# Patient Record
Sex: Female | Born: 1955 | Race: Black or African American | Hispanic: No | Marital: Single | Smoking: Former smoker
Health system: Southern US, Community
[De-identification: ages and names within clinical notes are randomized; demographics above are authoritative.]

## PROBLEM LIST (undated history)

## (undated) DIAGNOSIS — I499 Cardiac arrhythmia, unspecified: Secondary | ICD-10-CM

## (undated) DIAGNOSIS — R000 Tachycardia, unspecified: Secondary | ICD-10-CM

## (undated) DIAGNOSIS — E119 Type 2 diabetes mellitus without complications: Secondary | ICD-10-CM

## (undated) DIAGNOSIS — G8929 Other chronic pain: Secondary | ICD-10-CM

## (undated) DIAGNOSIS — D649 Anemia, unspecified: Secondary | ICD-10-CM

## (undated) DIAGNOSIS — E785 Hyperlipidemia, unspecified: Secondary | ICD-10-CM

## (undated) DIAGNOSIS — M1990 Unspecified osteoarthritis, unspecified site: Secondary | ICD-10-CM

## (undated) DIAGNOSIS — Z9289 Personal history of other medical treatment: Secondary | ICD-10-CM

## (undated) DIAGNOSIS — M545 Low back pain, unspecified: Secondary | ICD-10-CM

## (undated) HISTORY — DX: Tachycardia, unspecified: R00.0

## (undated) HISTORY — PX: JOINT REPLACEMENT: SHX530

---

## 1978-01-21 HISTORY — PX: TUBAL LIGATION: SHX77

## 1988-09-21 HISTORY — PX: CARPAL TUNNEL RELEASE: SHX101

## 1997-08-24 ENCOUNTER — Ambulatory Visit (HOSPITAL_COMMUNITY): Admission: RE | Admit: 1997-08-24 | Discharge: 1997-08-24 | Payer: Self-pay | Admitting: *Deleted

## 1998-01-21 HISTORY — PX: HERNIA REPAIR: SHX51

## 1998-03-28 ENCOUNTER — Other Ambulatory Visit: Admission: RE | Admit: 1998-03-28 | Discharge: 1998-03-28 | Payer: Self-pay | Admitting: *Deleted

## 1998-04-28 ENCOUNTER — Ambulatory Visit (HOSPITAL_COMMUNITY): Admission: RE | Admit: 1998-04-28 | Discharge: 1998-04-28 | Payer: Self-pay | Admitting: *Deleted

## 1998-06-07 ENCOUNTER — Encounter: Payer: Self-pay | Admitting: Gastroenterology

## 1998-06-07 ENCOUNTER — Ambulatory Visit (HOSPITAL_COMMUNITY): Admission: RE | Admit: 1998-06-07 | Discharge: 1998-06-07 | Payer: Self-pay | Admitting: Gastroenterology

## 1999-05-23 ENCOUNTER — Other Ambulatory Visit: Admission: RE | Admit: 1999-05-23 | Discharge: 1999-05-23 | Payer: Self-pay | Admitting: *Deleted

## 1999-05-31 ENCOUNTER — Ambulatory Visit (HOSPITAL_COMMUNITY): Admission: RE | Admit: 1999-05-31 | Discharge: 1999-05-31 | Payer: Self-pay | Admitting: *Deleted

## 2000-08-05 ENCOUNTER — Other Ambulatory Visit: Admission: RE | Admit: 2000-08-05 | Discharge: 2000-08-05 | Payer: Self-pay | Admitting: Internal Medicine

## 2000-08-22 ENCOUNTER — Ambulatory Visit (HOSPITAL_COMMUNITY): Admission: RE | Admit: 2000-08-22 | Discharge: 2000-08-22 | Payer: Self-pay | Admitting: Internal Medicine

## 2000-08-22 ENCOUNTER — Encounter: Payer: Self-pay | Admitting: Internal Medicine

## 2001-08-31 ENCOUNTER — Other Ambulatory Visit: Admission: RE | Admit: 2001-08-31 | Discharge: 2001-08-31 | Payer: Self-pay | Admitting: Internal Medicine

## 2001-09-08 ENCOUNTER — Ambulatory Visit (HOSPITAL_COMMUNITY): Admission: RE | Admit: 2001-09-08 | Discharge: 2001-09-08 | Payer: Self-pay | Admitting: Internal Medicine

## 2001-09-08 ENCOUNTER — Encounter: Payer: Self-pay | Admitting: Internal Medicine

## 2002-01-21 DIAGNOSIS — Z9289 Personal history of other medical treatment: Secondary | ICD-10-CM

## 2002-01-21 HISTORY — DX: Personal history of other medical treatment: Z92.89

## 2002-09-07 ENCOUNTER — Ambulatory Visit (HOSPITAL_COMMUNITY): Admission: RE | Admit: 2002-09-07 | Discharge: 2002-09-07 | Payer: Self-pay | Admitting: Internal Medicine

## 2002-09-07 ENCOUNTER — Encounter: Payer: Self-pay | Admitting: Internal Medicine

## 2002-10-22 HISTORY — PX: TOTAL KNEE ARTHROPLASTY: SHX125

## 2002-11-10 ENCOUNTER — Encounter: Payer: Self-pay | Admitting: Orthopedic Surgery

## 2002-11-16 ENCOUNTER — Inpatient Hospital Stay (HOSPITAL_COMMUNITY): Admission: RE | Admit: 2002-11-16 | Discharge: 2002-11-24 | Payer: Self-pay | Admitting: Orthopedic Surgery

## 2002-11-25 ENCOUNTER — Encounter: Admission: RE | Admit: 2002-11-25 | Discharge: 2002-11-25 | Payer: Self-pay | Admitting: Family Medicine

## 2003-01-05 ENCOUNTER — Encounter (HOSPITAL_BASED_OUTPATIENT_CLINIC_OR_DEPARTMENT_OTHER): Admission: RE | Admit: 2003-01-05 | Discharge: 2003-04-05 | Payer: Self-pay | Admitting: Internal Medicine

## 2003-03-10 ENCOUNTER — Emergency Department (HOSPITAL_COMMUNITY): Admission: EM | Admit: 2003-03-10 | Discharge: 2003-03-10 | Payer: Self-pay | Admitting: Family Medicine

## 2003-09-05 ENCOUNTER — Other Ambulatory Visit: Admission: RE | Admit: 2003-09-05 | Discharge: 2003-09-05 | Payer: Self-pay | Admitting: Internal Medicine

## 2003-09-08 ENCOUNTER — Ambulatory Visit (HOSPITAL_COMMUNITY): Admission: RE | Admit: 2003-09-08 | Discharge: 2003-09-08 | Payer: Self-pay | Admitting: Internal Medicine

## 2004-01-18 ENCOUNTER — Encounter: Admission: RE | Admit: 2004-01-18 | Discharge: 2004-04-17 | Payer: Self-pay | Admitting: Internal Medicine

## 2004-06-07 ENCOUNTER — Encounter: Admission: RE | Admit: 2004-06-07 | Discharge: 2004-09-05 | Payer: Self-pay | Admitting: Internal Medicine

## 2004-09-13 ENCOUNTER — Encounter: Admission: RE | Admit: 2004-09-13 | Discharge: 2004-12-12 | Payer: Self-pay | Admitting: Internal Medicine

## 2004-09-18 ENCOUNTER — Ambulatory Visit (HOSPITAL_COMMUNITY): Admission: RE | Admit: 2004-09-18 | Discharge: 2004-09-18 | Payer: Self-pay | Admitting: Internal Medicine

## 2004-10-10 ENCOUNTER — Other Ambulatory Visit: Admission: RE | Admit: 2004-10-10 | Discharge: 2004-10-10 | Payer: Self-pay | Admitting: *Deleted

## 2004-11-23 ENCOUNTER — Ambulatory Visit: Payer: Self-pay | Admitting: Cardiology

## 2004-12-05 ENCOUNTER — Ambulatory Visit: Payer: Self-pay

## 2005-10-14 ENCOUNTER — Other Ambulatory Visit: Admission: RE | Admit: 2005-10-14 | Discharge: 2005-10-14 | Payer: Self-pay | Admitting: *Deleted

## 2005-10-14 ENCOUNTER — Ambulatory Visit (HOSPITAL_COMMUNITY): Admission: RE | Admit: 2005-10-14 | Discharge: 2005-10-14 | Payer: Self-pay | Admitting: Family Medicine

## 2005-10-14 IMAGING — MG MM DIGITAL SCREENING BILAT
4 series · 4 of 4 positions shown · non-contrast
Comparison: none

DG SCREEN MAMMOGRAM BILATERAL
Bilateral CC and MLO view(s) were taken.
Prior study comparison: [DATE], bilateral screening mammogram.

DIGITAL SCREENING MAMMOGRAM WITH CAD:
The breast tissue is heterogeneously dense.  There is no dominant mass, architectural distortion or
calcification to suggest malignancy.

[R CC]
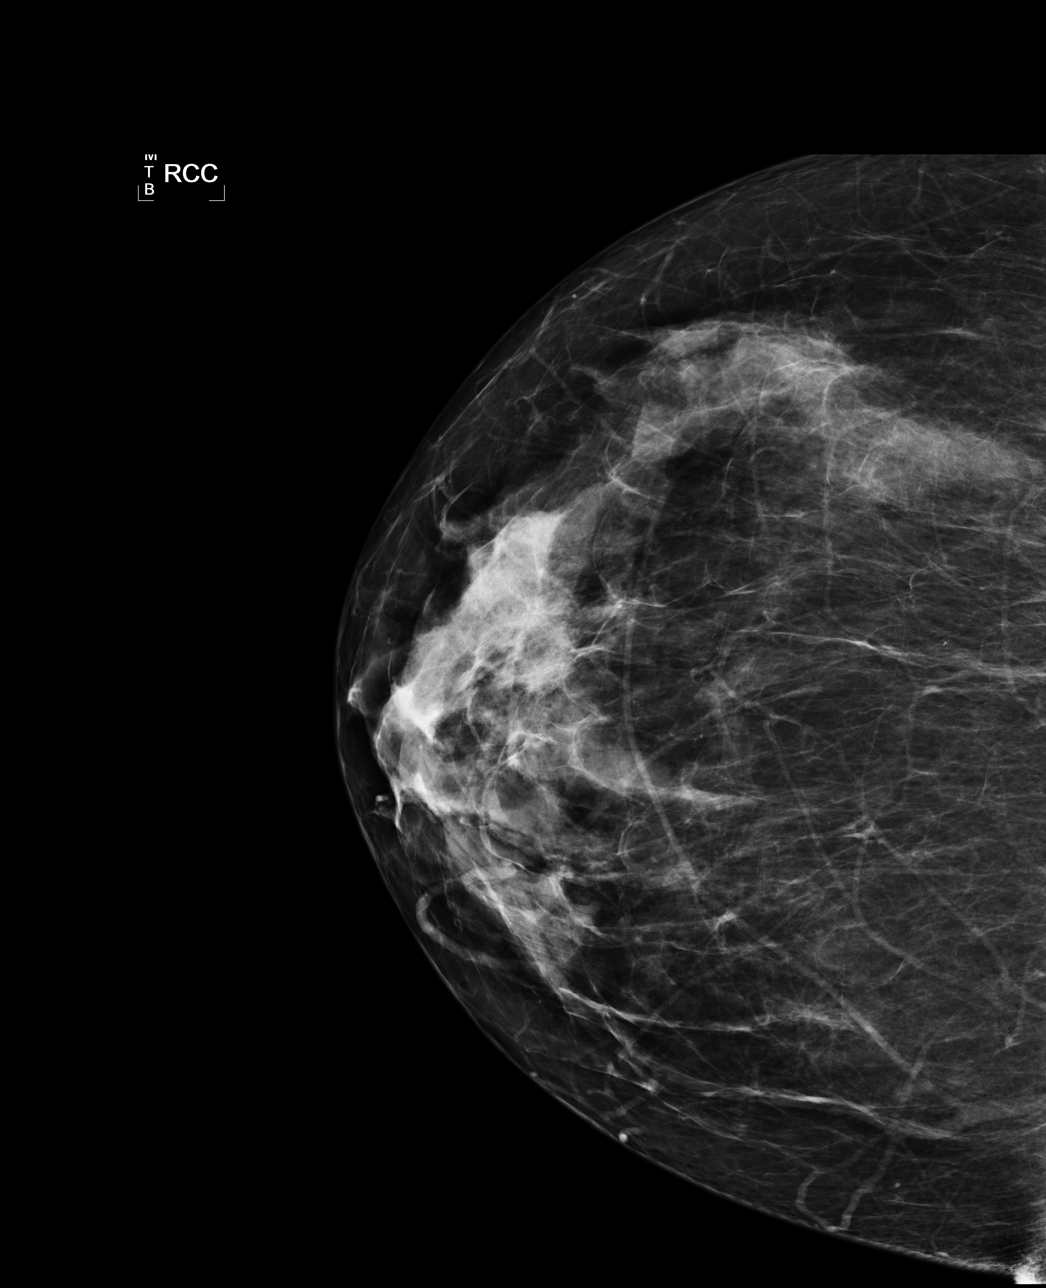

[R MLO]
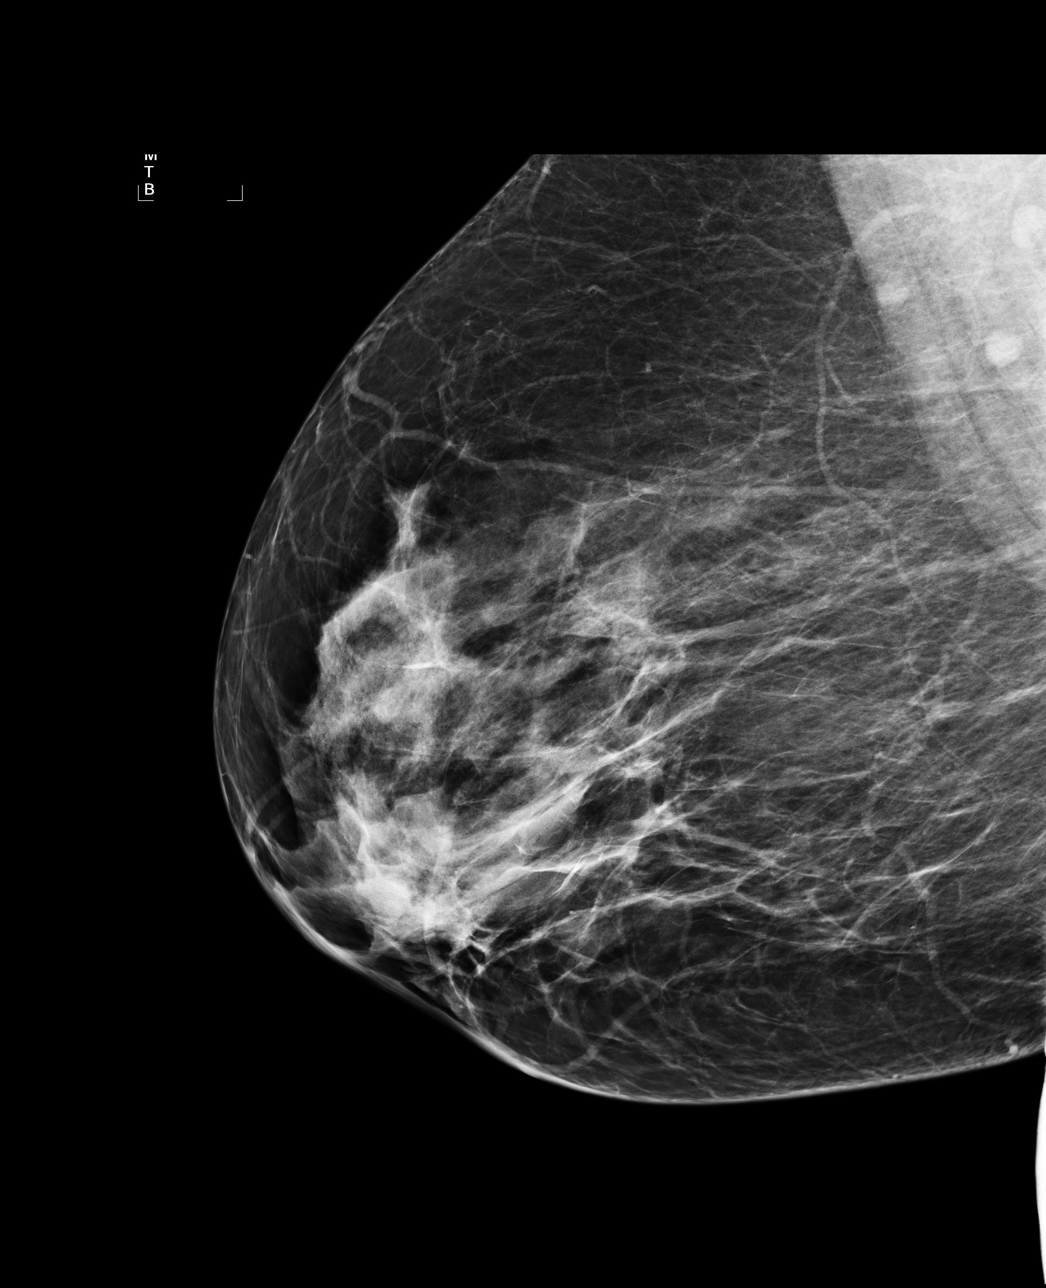

[L CC]
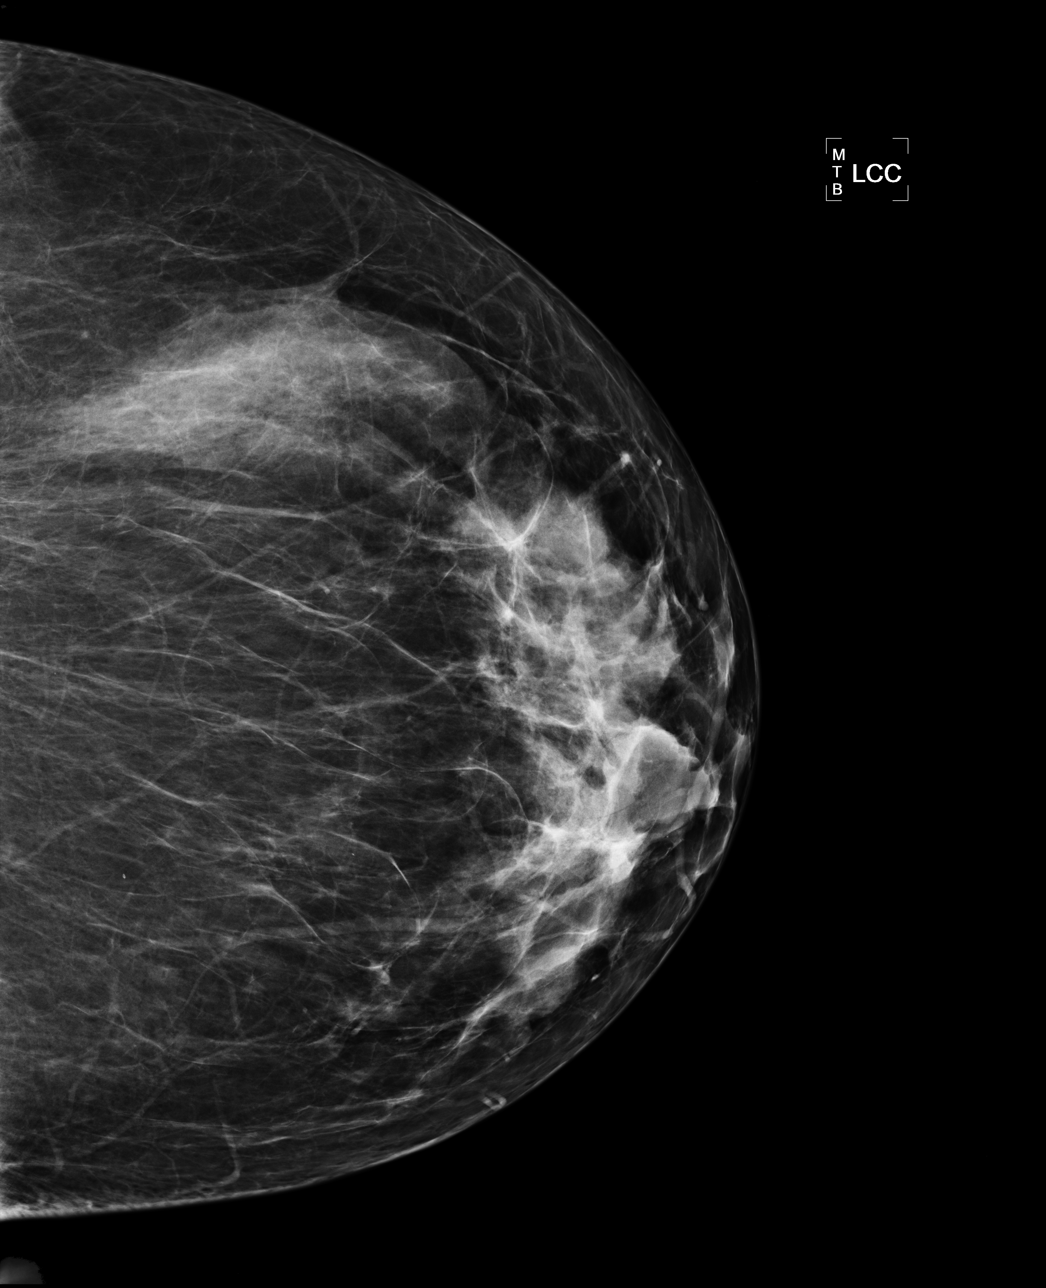

[L MLO]
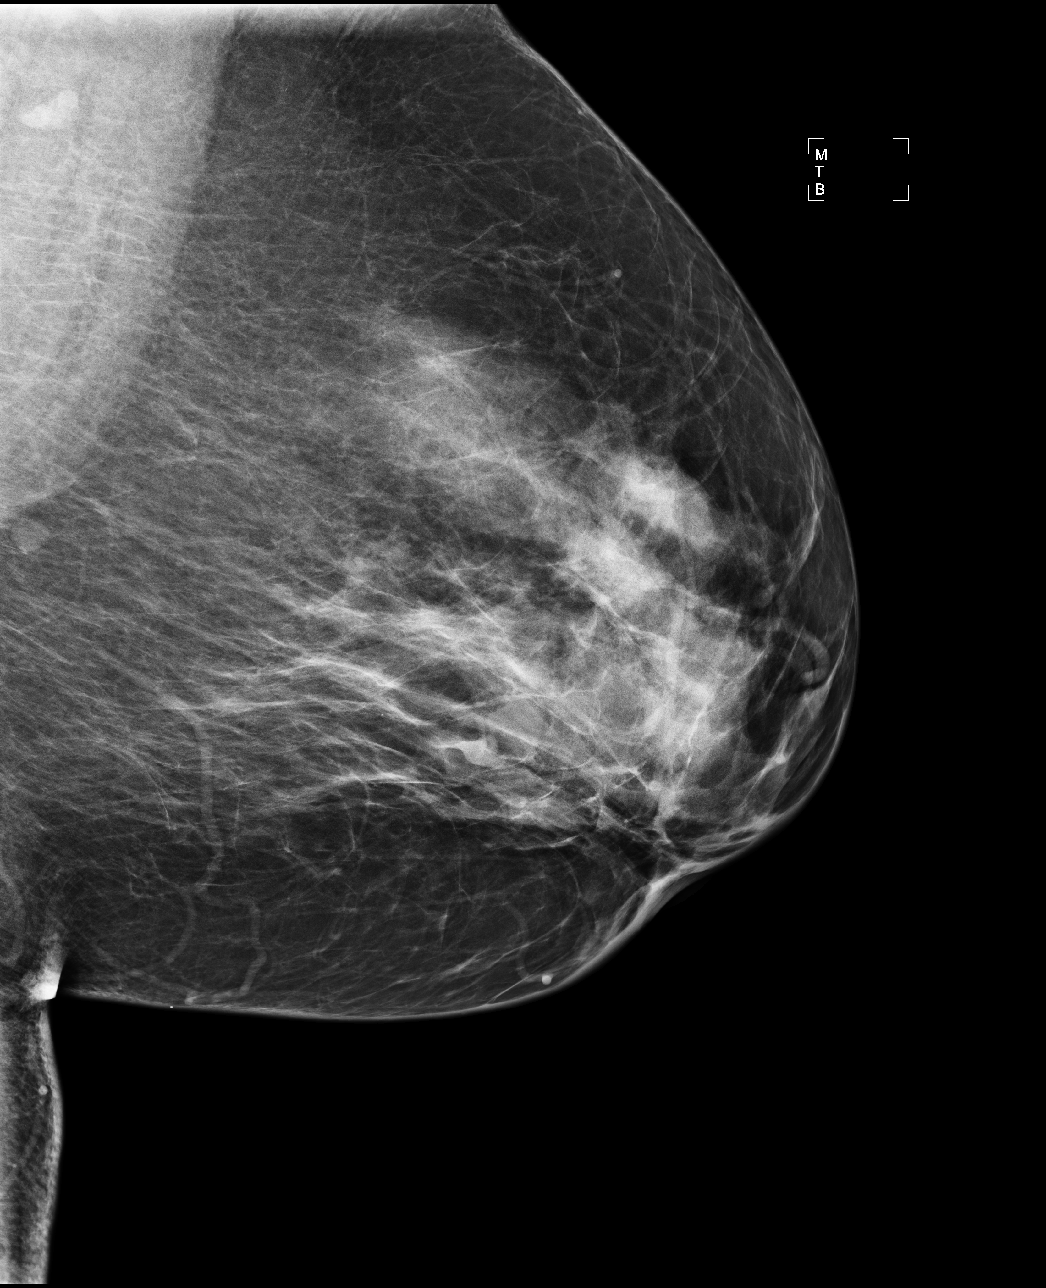

[4 of 4 positions shown; findings below may reference images not displayed]

IMPRESSION: No mammographic evidence of malignancy.  Suggest yearly screening mammography.

ASSESSMENT: Negative - BI-RADS 1

Screening mammogram in 1 year.
ANALYZED BY COMPUTER AIDED DETECTION. , THIS PROCEDURE WAS A DIGITAL MAMMOGRAM.

## 2005-12-20 ENCOUNTER — Encounter: Admission: RE | Admit: 2005-12-20 | Discharge: 2005-12-20 | Payer: Self-pay | Admitting: Family Medicine

## 2005-12-20 IMAGING — US US EXTREM LOW VENOUS*L*
1 series · 14 of 24 positions shown · non-contrast
Comparison: none

CLINICAL DATA: Left calf pain and swelling.   Rule out DVT.
LEFT LOWER EXTREMITY VENOUS DOPPLER ULTRASOUND:
TECHNIQUE: Gray scale sonography with compression, as well as color and duplex Doppler ultrasound, were performed to evaluate the deep venous system from the level of the common femoral vein through the popliteal and proximal calf veins.

[Series 1: unknown · 14 of 33 slices shown]
[im 1/33]
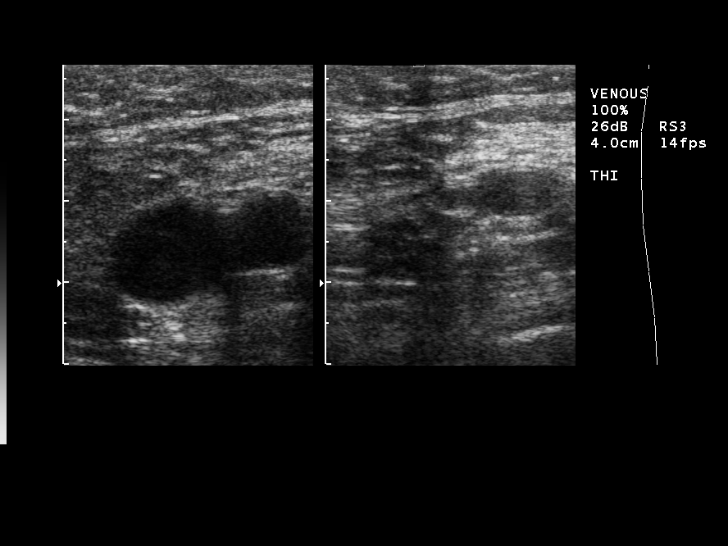
[im 3/33]
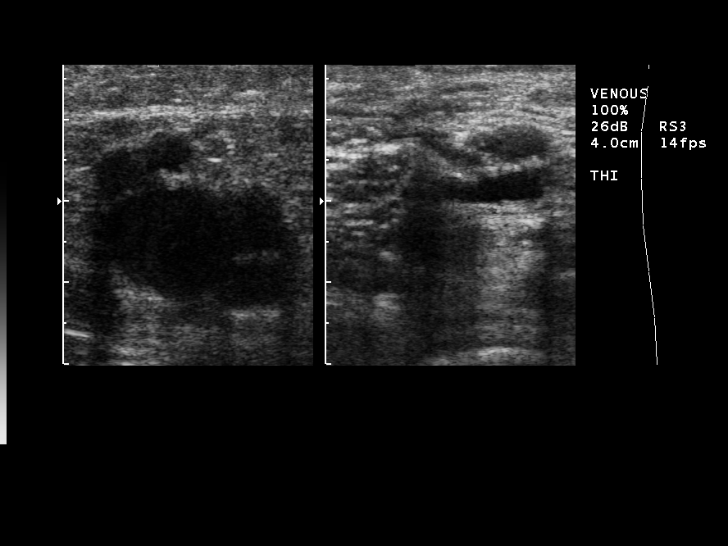
[im 6/33]
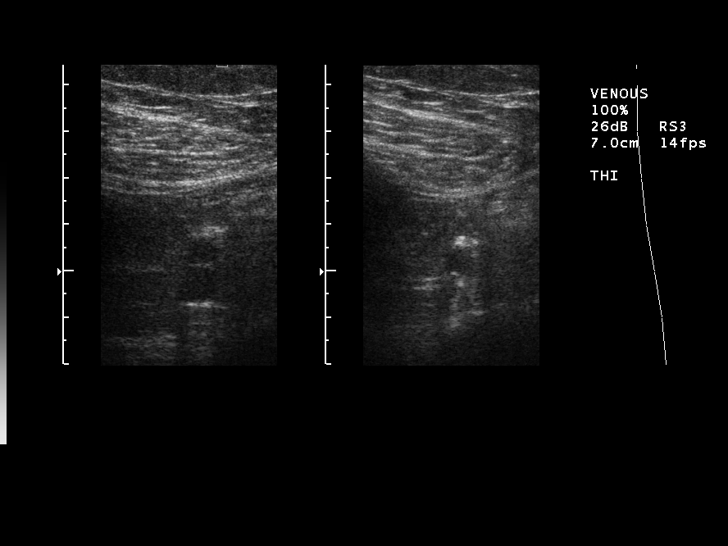
[im 9/33]
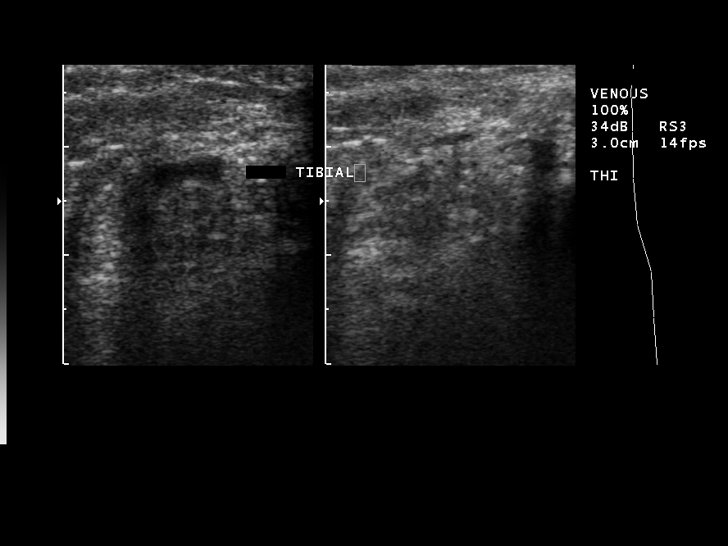
[im 10/33]
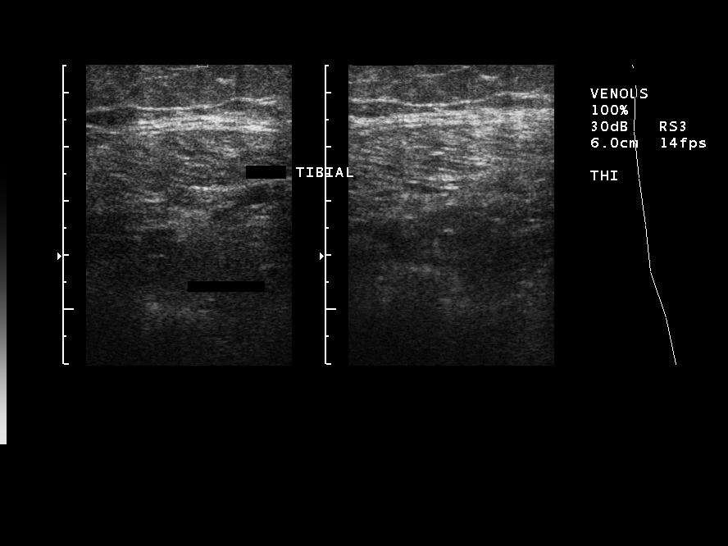
[im 13/33]
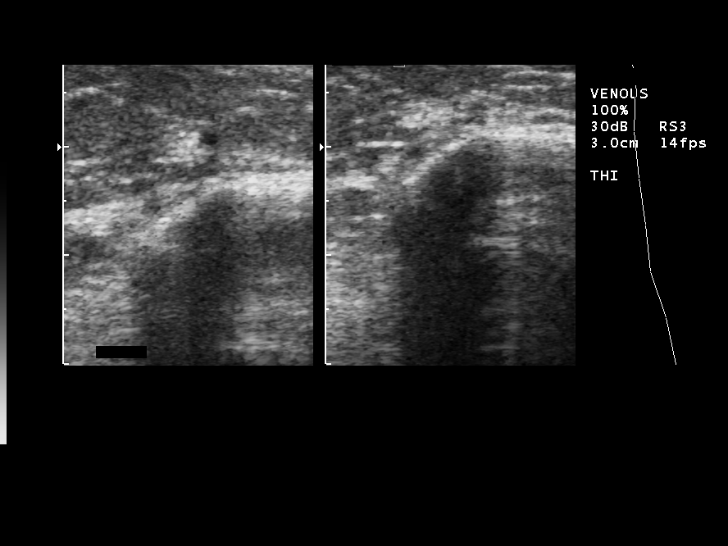
[im 16/33]
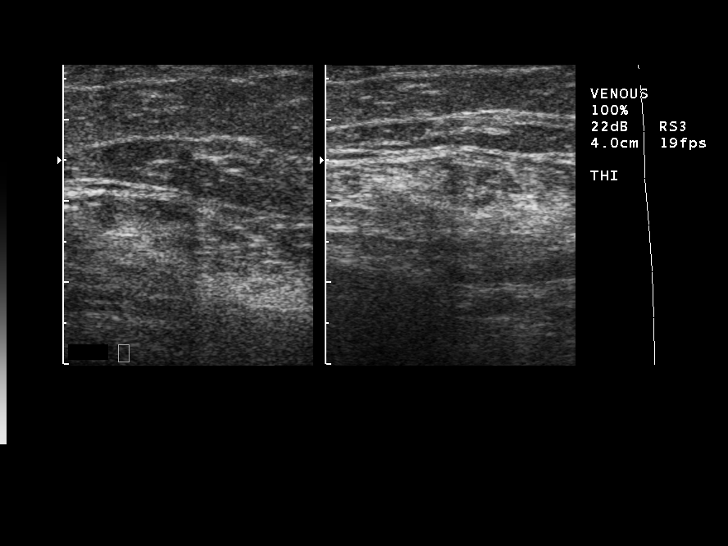
[im 17/33]
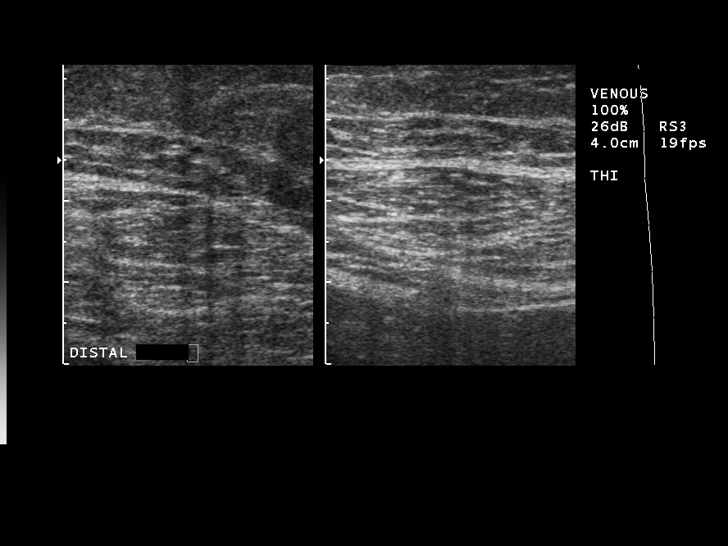
[im 20/33]
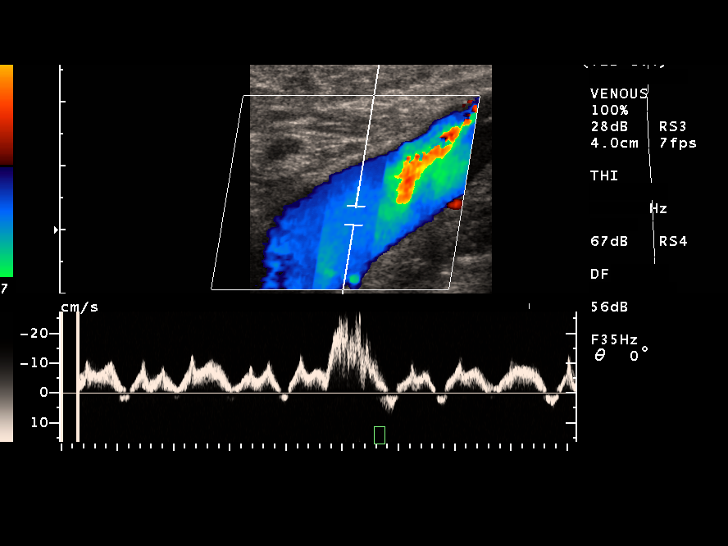
[im 23/33]
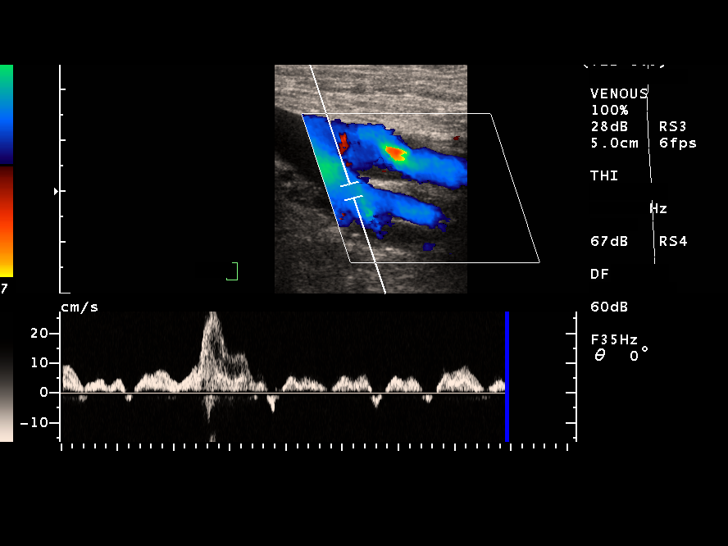
[im 26/33]
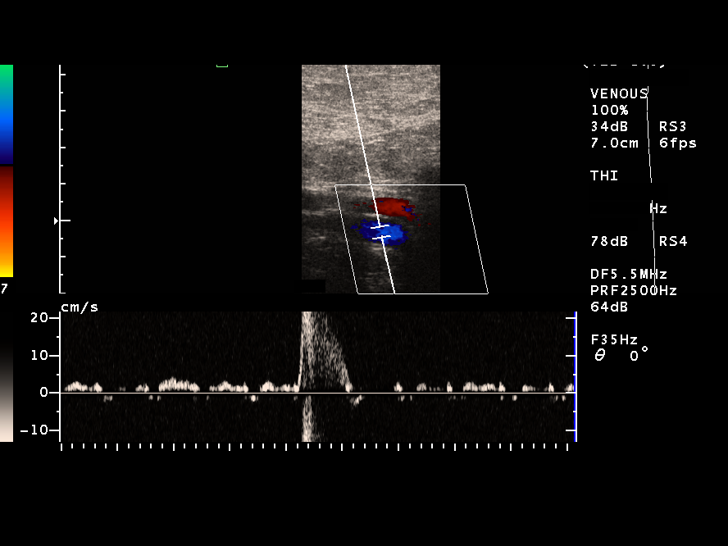
[im 27/33]
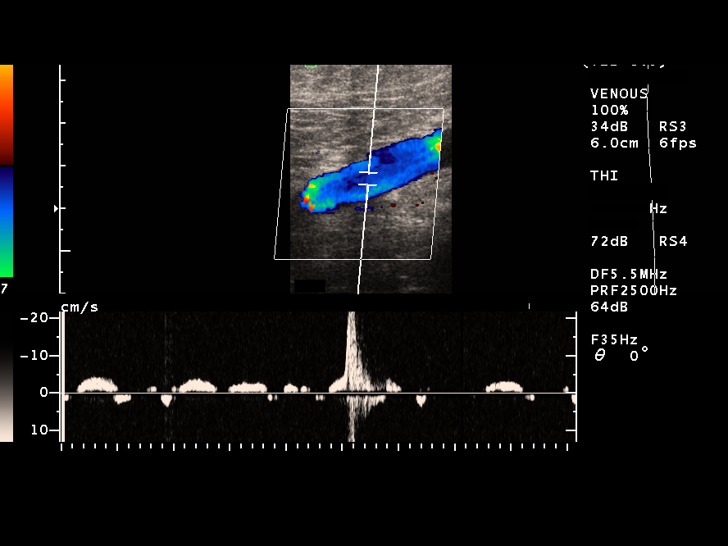
[im 30/33]
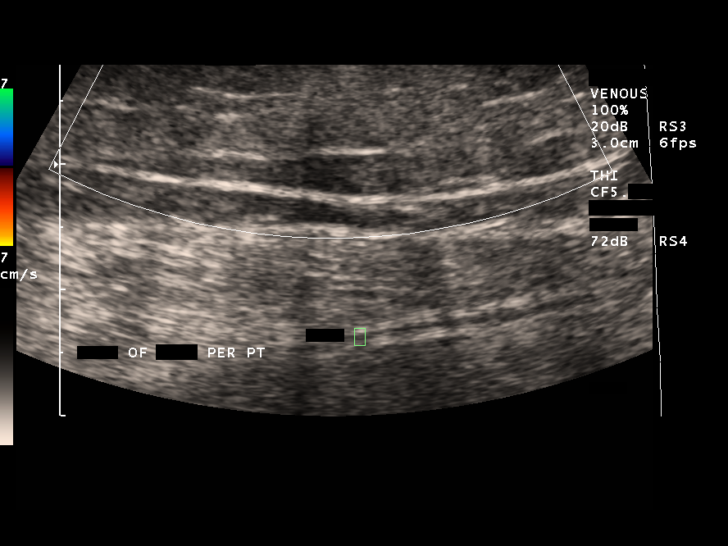
[im 33/33]
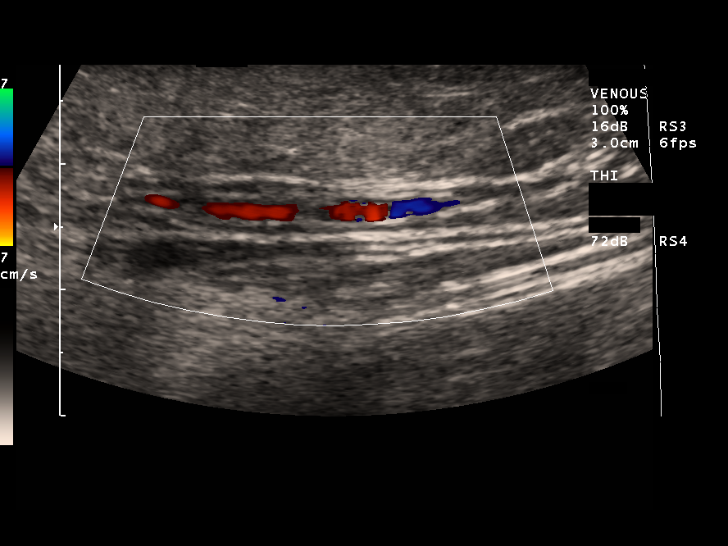

[14 of 24 positions shown; findings below may reference images not displayed]

FINDINGS: Normal compressibility and Doppler flow in the deep veins of the left leg.  No evidence of DVT.
IMPRESSION: 1.  Negative for DVT.
2.  No fluid collection or mass is seen in the area identified by the patient in the posterior calf in the area of swelling.

## 2006-07-15 ENCOUNTER — Ambulatory Visit: Payer: Self-pay | Admitting: Oncology

## 2006-09-03 ENCOUNTER — Ambulatory Visit: Payer: Self-pay | Admitting: Oncology

## 2006-09-03 LAB — URINALYSIS, MICROSCOPIC - CHCC
Bilirubin (Urine): NEGATIVE
Blood: NEGATIVE
Leukocyte Esterase: NEGATIVE
Nitrite: NEGATIVE
Protein: NEGATIVE mg/dL
RBC count: NEGATIVE (ref 0–2)
Specific Gravity, Urine: 1.03 (ref 1.003–1.035)

## 2006-09-03 LAB — CHCC SMEAR

## 2006-09-03 LAB — CBC & DIFF AND RETIC
Basophils Absolute: 0 10*3/uL (ref 0.0–0.1)
EOS%: 1 % (ref 0.0–7.0)
LYMPH%: 42.2 % (ref 14.0–48.0)
MCH: 26.3 pg (ref 26.0–34.0)
MCHC: 32.8 g/dL (ref 32.0–36.0)
MONO%: 6.4 % (ref 0.0–13.0)
NEUT%: 49.9 % (ref 39.6–76.8)
RBC: 3.56 10*6/uL — ABNORMAL LOW (ref 3.70–5.32)
RDW: 16.6 % — ABNORMAL HIGH (ref 11.3–14.5)
lymph#: 2.6 10*3/uL (ref 0.9–3.3)

## 2006-09-03 LAB — ERYTHROCYTE SEDIMENTATION RATE: Sed Rate: 45 mm/hr — ABNORMAL HIGH (ref 0–30)

## 2006-09-03 LAB — MORPHOLOGY

## 2006-09-05 LAB — IMMUNOFIXATION ELECTROPHORESIS
IgA: 186 mg/dL (ref 68–378)
IgG (Immunoglobin G), Serum: 1370 mg/dL (ref 694–1618)
IgM, Serum: 53 mg/dL — ABNORMAL LOW (ref 60–263)

## 2006-09-05 LAB — ANA: Anti Nuclear Antibody(ANA): NEGATIVE

## 2006-09-25 ENCOUNTER — Ambulatory Visit (HOSPITAL_COMMUNITY): Admission: RE | Admit: 2006-09-25 | Discharge: 2006-09-25 | Payer: Self-pay | Admitting: Oncology

## 2006-09-25 ENCOUNTER — Encounter: Payer: Self-pay | Admitting: Oncology

## 2006-09-25 ENCOUNTER — Ambulatory Visit: Payer: Self-pay | Admitting: Oncology

## 2006-10-08 ENCOUNTER — Encounter: Admission: RE | Admit: 2006-10-08 | Discharge: 2006-10-08 | Payer: Self-pay | Admitting: Family Medicine

## 2006-10-08 IMAGING — CT CT PELVIS W/ CM
2 of 5 series · 17 of 46 positions shown, 19 images · IV contrast (READICAT/WATER & [ID] OMNI 300)
Comparison: None.

CLINICAL DATA: Periumbilical pain and possible mass.
 ABDOMEN CT WITH CONTRAST:
TECHNIQUE: Multidetector CT imaging of the abdomen was performed following the standard protocol during bolus administration of intravenous contrast.
 Contrast:  125 cc of Omnipaque 300.
TECHNIQUE: Multidetector CT imaging of the pelvis was performed following the standard protocol during bolus administration of intravenous contrast.

[Series 3: routine abdomen · axial · 0.82mm/px · z∈[-345,+45]mm · 14 of 88 slices shown, 16 images]
[im 5/88  soft-tissue]
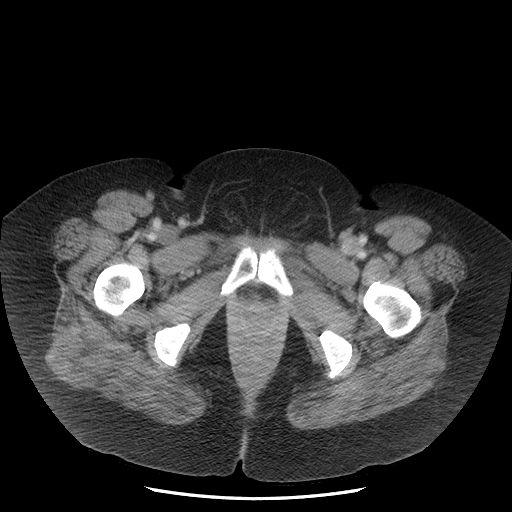
[im 5/88  bone]
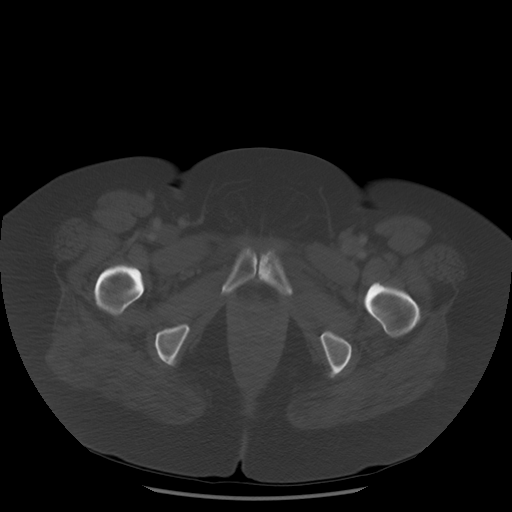
[im 10/88  soft-tissue]
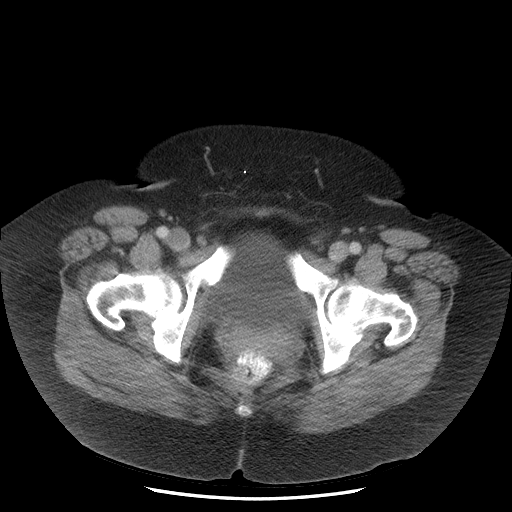
[im 20/88  soft-tissue]
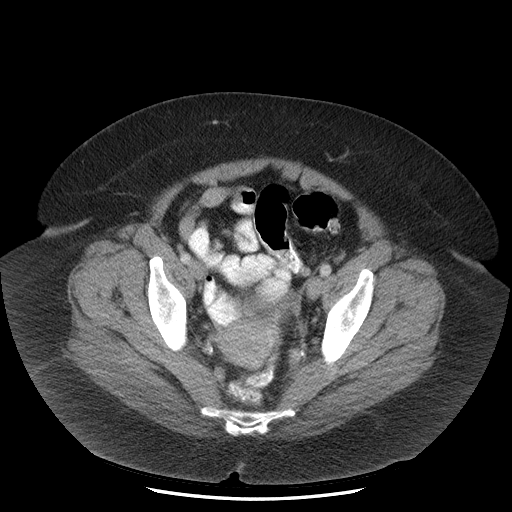
[im 25/88  soft-tissue]
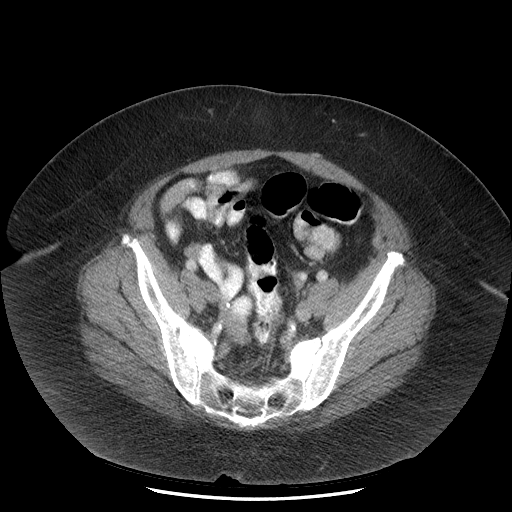
[im 30/88  soft-tissue]
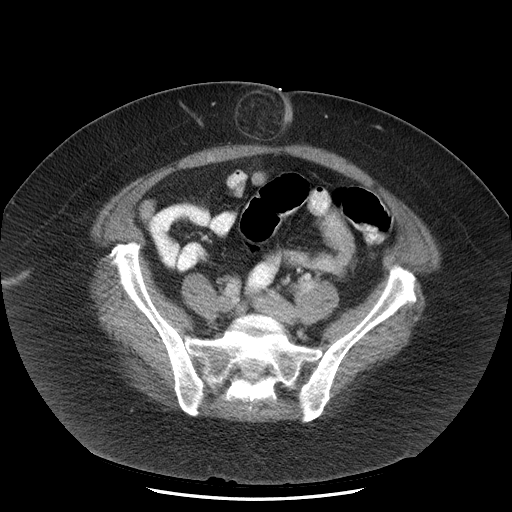
[im 34/88  soft-tissue]
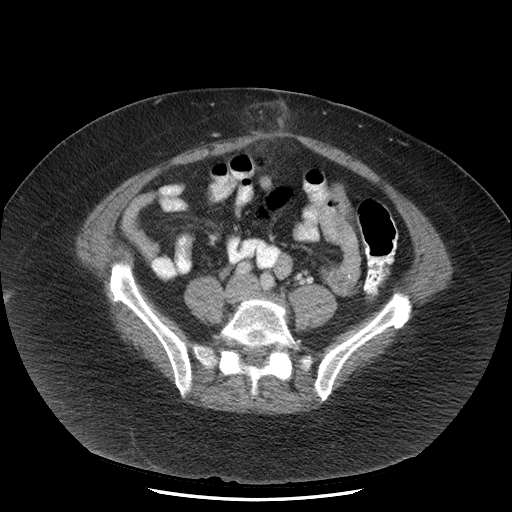
[im 39/88  soft-tissue]
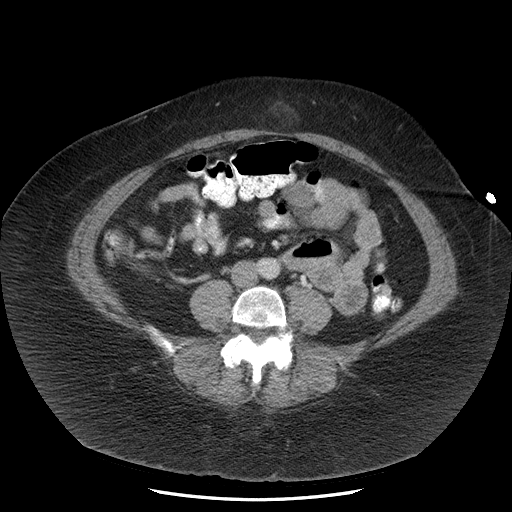
[im 49/88  soft-tissue]
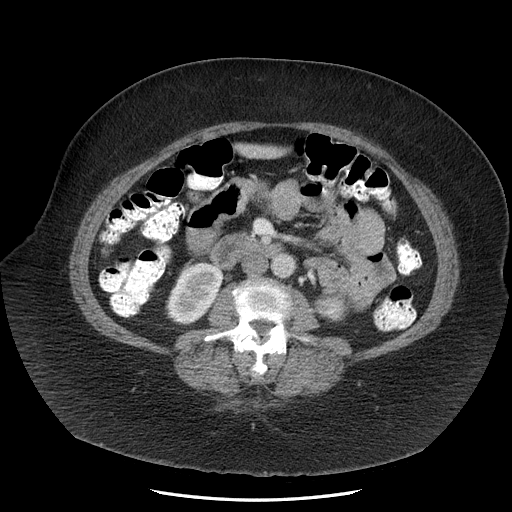
[im 54/88  soft-tissue]
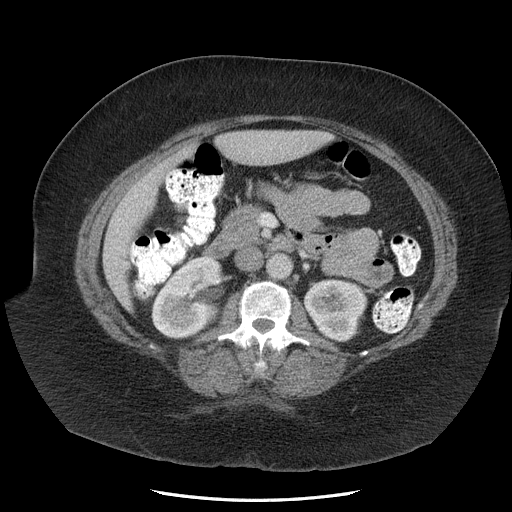
[im 54/88  bone]
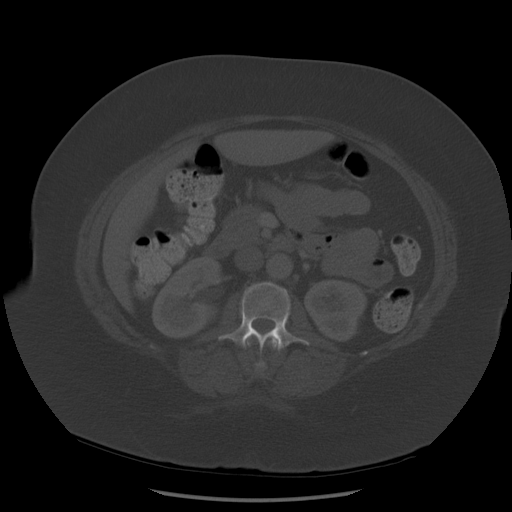
[im 59/88  soft-tissue]
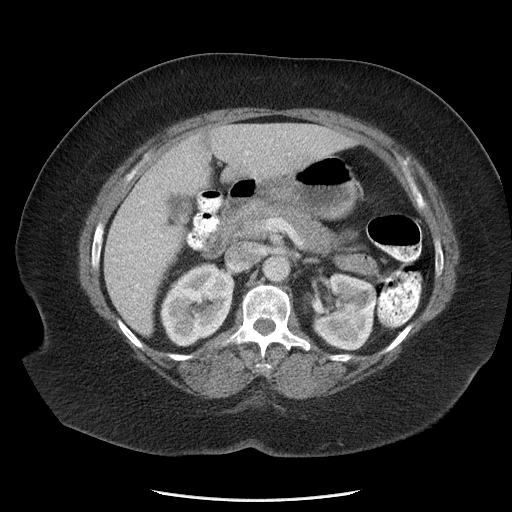
[im 63/88  soft-tissue]
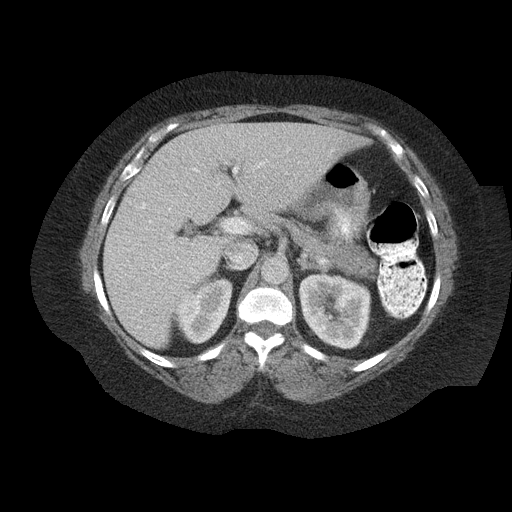
[im 68/88  soft-tissue]
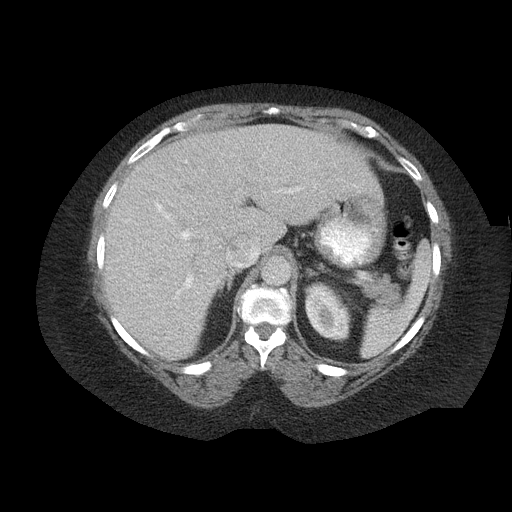
[im 78/88  soft-tissue]
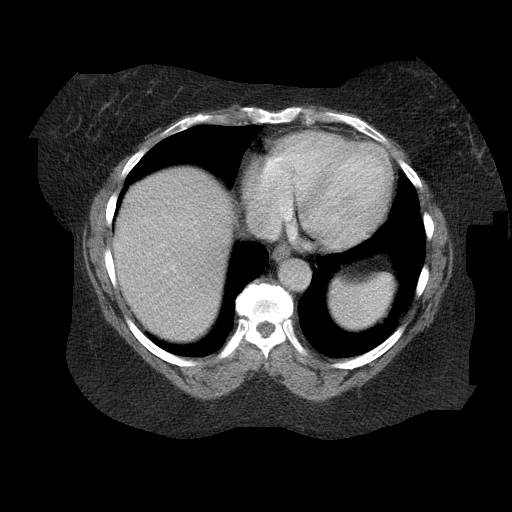
[im 83/88  soft-tissue]
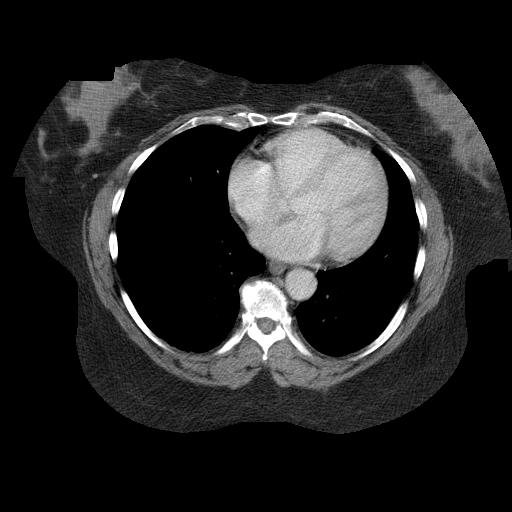

[Series 602: sagittal body · sagittal · 0.86mm/px · 3 of 169 slices shown]
[im 57/169  soft-tissue]
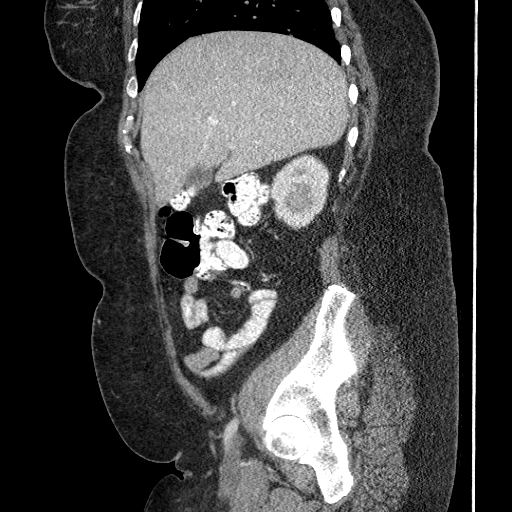
[im 75/169  soft-tissue]
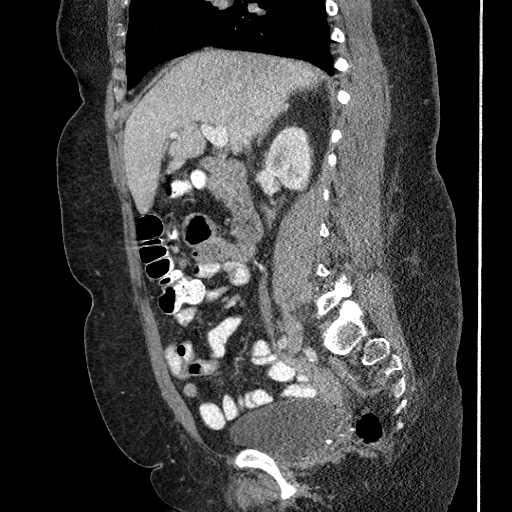
[im 94/169  soft-tissue]
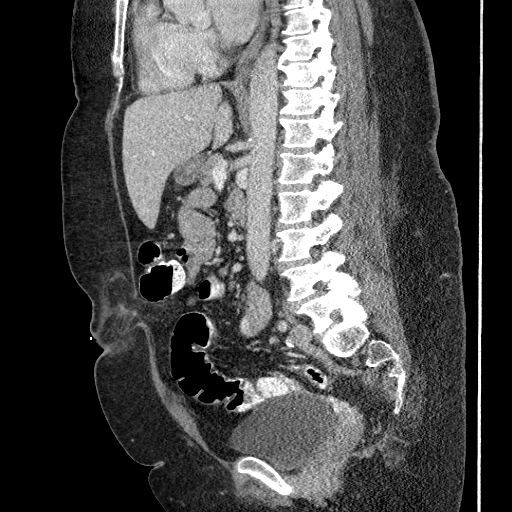

[17 of 46 positions shown; findings below may reference images not displayed]

FINDINGS: The lung bases are clear other than vague nodular opacity in the anterior right middle lobe on image #1 which may represent scarring.  The liver enhances with no focal abnormality and no ductal dilatation is seen.  No calcified gallstones are noted with the gallbladder somewhat retracted.  The pancreas is normal in size an the pancreatic duct is not dilated. The adrenal glands and spleen appear normal.  The kidneys enhance and, on delayed images, the pelvocaliceal systems appear normal.  The abdominal aorta is normal in caliber.
IMPRESSION: Negative CT of the abdomen.
 PELVIS CT WITH CONTRAST:
FINDINGS: A small periumbilical hernia is noted slightly superior and to the right of the umbilicus containing only fat.  Uterus is normal in size.  The urinary bladder is unremarkable.  No pelvic mass, fluid, or adenopathy is seen.
IMPRESSION: Small right periumbilical hernia containing only fat.  No other abnormality.

## 2006-10-24 ENCOUNTER — Ambulatory Visit (HOSPITAL_COMMUNITY): Admission: RE | Admit: 2006-10-24 | Discharge: 2006-10-24 | Payer: Self-pay | Admitting: Family Medicine

## 2006-10-24 IMAGING — MG MM DIGITAL SCREENING BILAT
1 series · 1 of 1 positions shown · non-contrast
Comparison: Prior studies.

DG SCREEN MAMMOGRAM BILATERAL
Bilateral CC and MLO view(s) were taken.
Prior study comparison: [DATE], bilateral screening mammogram.

DIGITAL SCREENING MAMMOGRAM WITH CAD:

[L CC]
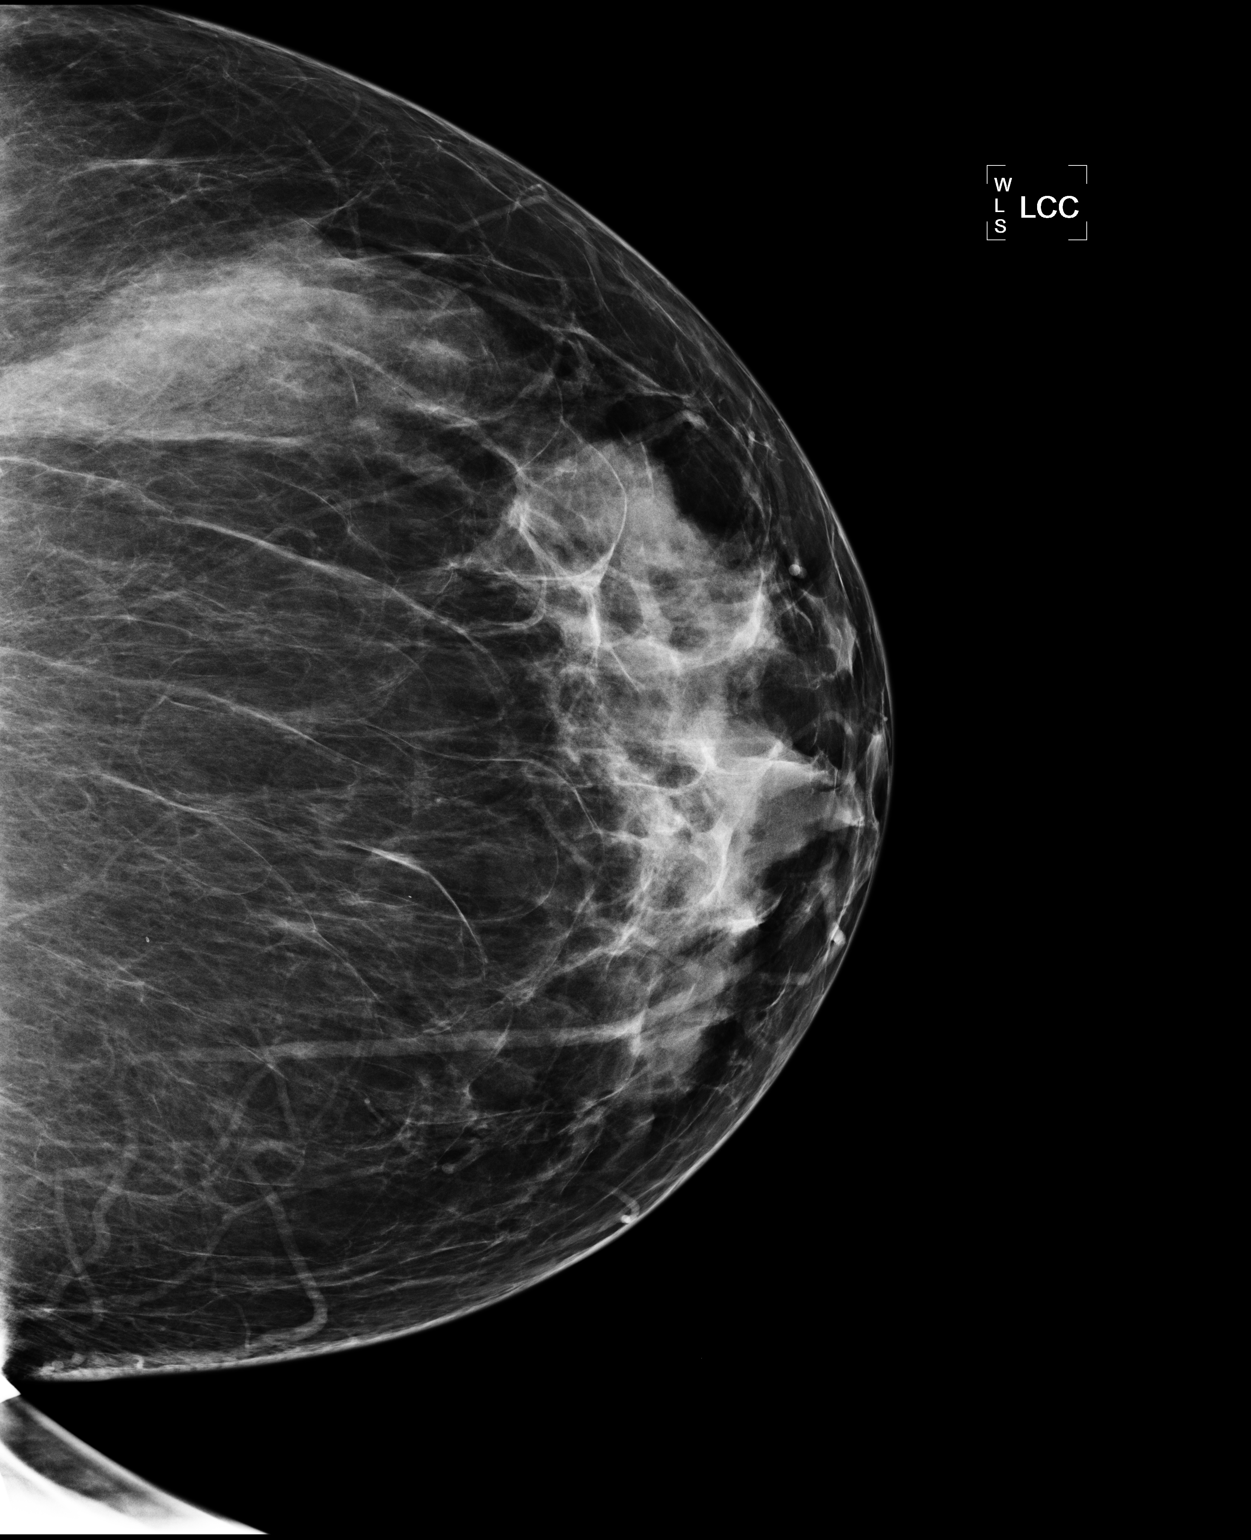

[1 of 1 positions shown; findings below may reference images not displayed]

There are scattered fibroglandular densities.  There is no dominant mass, architectural distortion 
or calcification to suggest malignancy.
IMPRESSION: No mammographic evidence of malignancy.  Suggest yearly screening mammography.

ASSESSMENT: Negative - BI-RADS 1

Screening mammogram in 1 year.
ANALYZED BY COMPUTER AIDED DETECTION. , THIS PROCEDURE WAS A DIGITAL MAMMOGRAM.

## 2006-12-11 ENCOUNTER — Ambulatory Visit (HOSPITAL_BASED_OUTPATIENT_CLINIC_OR_DEPARTMENT_OTHER): Admission: RE | Admit: 2006-12-11 | Discharge: 2006-12-11 | Payer: Self-pay | Admitting: Surgery

## 2007-04-14 ENCOUNTER — Other Ambulatory Visit: Admission: RE | Admit: 2007-04-14 | Discharge: 2007-04-14 | Payer: Self-pay | Admitting: *Deleted

## 2007-08-12 ENCOUNTER — Ambulatory Visit: Payer: Self-pay | Admitting: Vascular Surgery

## 2007-10-27 ENCOUNTER — Ambulatory Visit (HOSPITAL_COMMUNITY): Admission: RE | Admit: 2007-10-27 | Discharge: 2007-10-27 | Payer: Self-pay | Admitting: Family Medicine

## 2007-10-27 IMAGING — MG MM DIGITAL SCREENING BILAT
4 series · 4 of 4 positions shown · non-contrast
Comparison: Prior studies.

DG SCREEN MAMMOGRAM BILATERAL
Bilateral CC and MLO view(s) were taken.
Technologist: ESPOSTO.(ESPOSTO)(M)

DIGITAL SCREENING MAMMOGRAM WITH CAD:

[R CC]
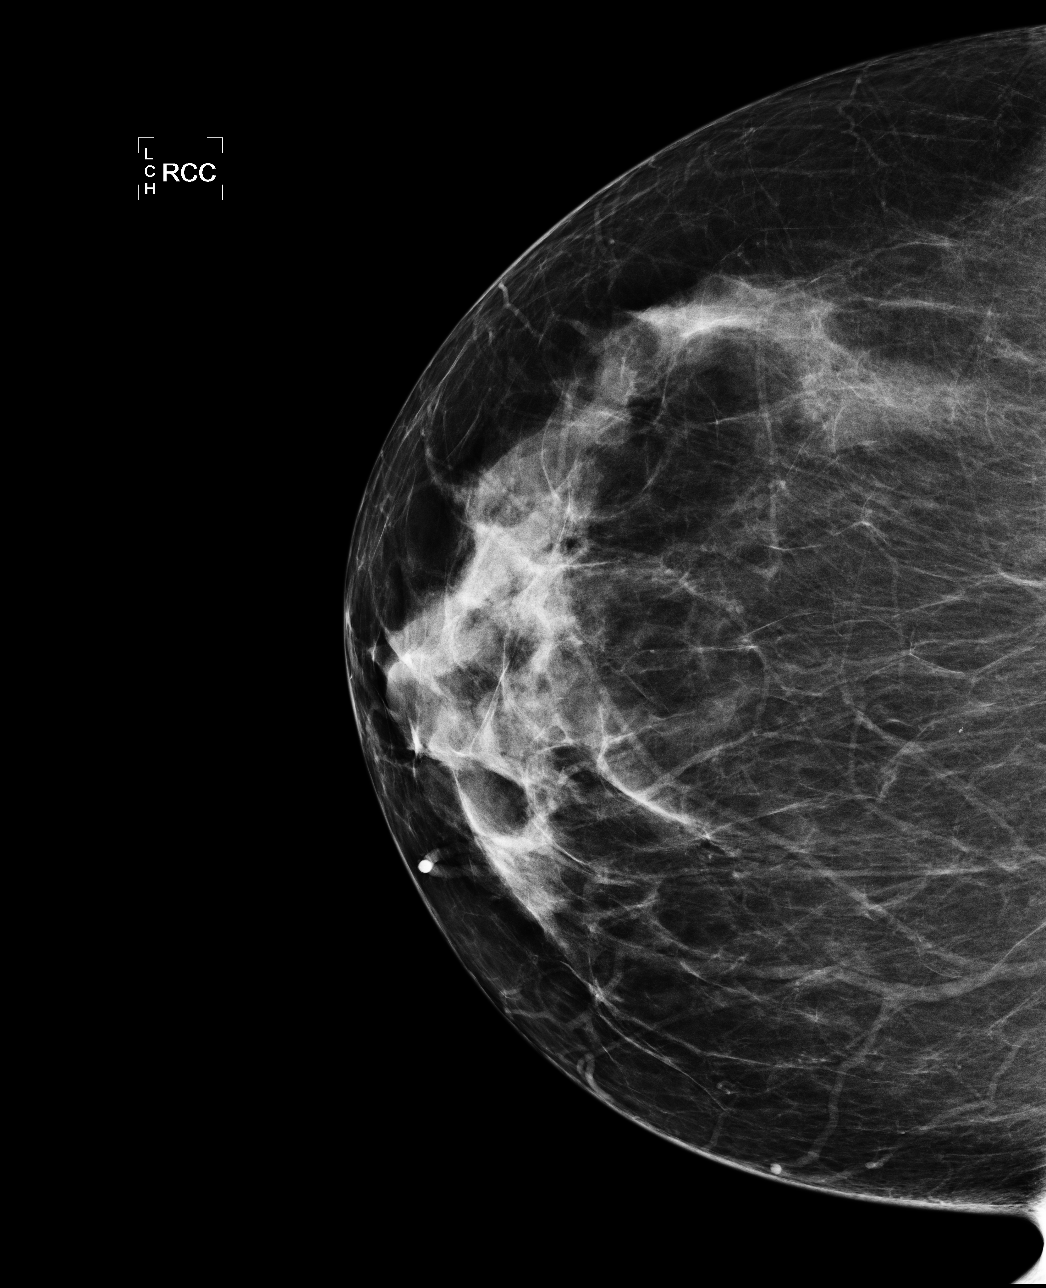

[R MLO]
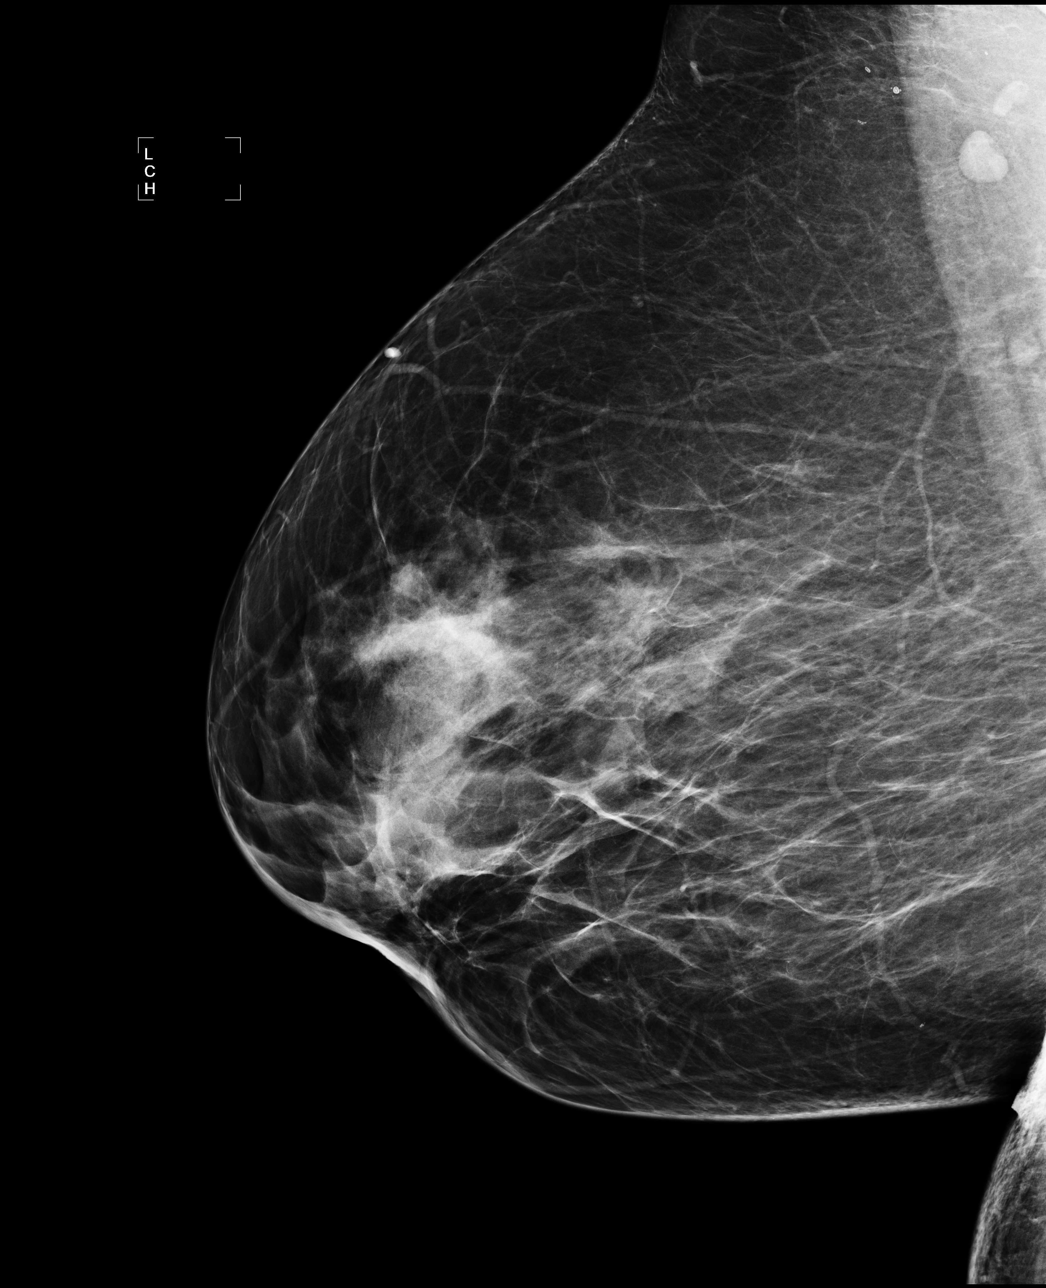

[L CC]
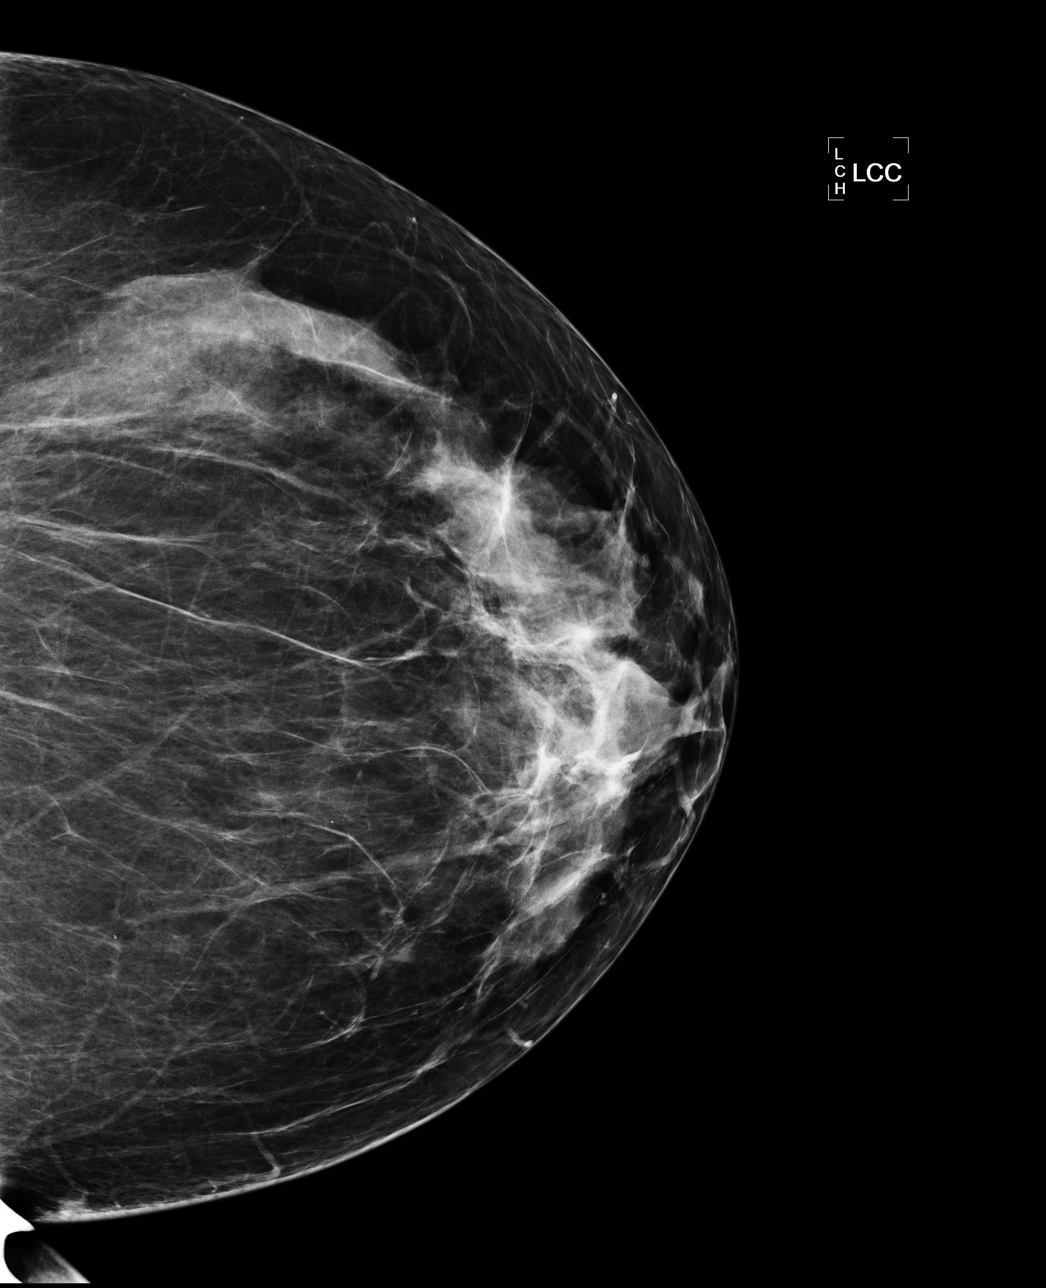

[L MLO]
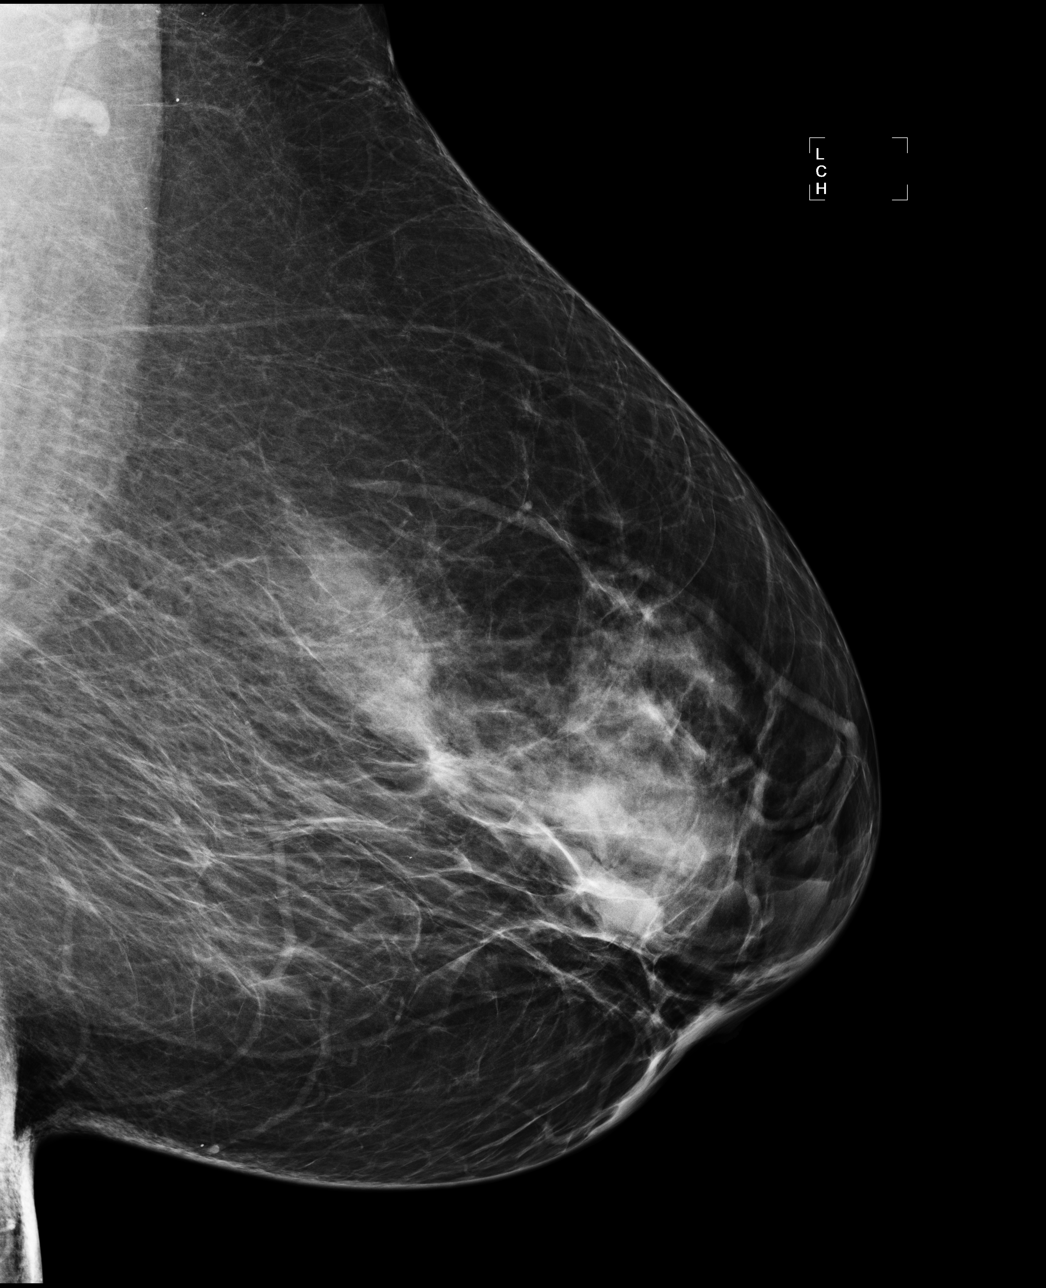

[4 of 4 positions shown; findings below may reference images not displayed]

The breast tissue is heterogeneously dense.  There is no dominant mass, architectural distortion or
calcification to suggest malignancy.
IMPRESSION: No mammographic evidence of malignancy.  Suggest yearly screening mammography.

ASSESSMENT: Negative - BI-RADS 1

Screening mammogram in 1 year.
ANALYZED BY COMPUTER AIDED DETECTION. , THIS PROCEDURE WAS A DIGITAL MAMMOGRAM.

## 2007-12-13 ENCOUNTER — Emergency Department (HOSPITAL_COMMUNITY): Admission: EM | Admit: 2007-12-13 | Discharge: 2007-12-13 | Payer: Self-pay | Admitting: Family Medicine

## 2007-12-13 IMAGING — CR DG SHOULDER 2+V*L*
3 series · 3 of 3 positions shown · non-contrast
Comparison: None available

CLINICAL DATA: Arm pain.

LEFT SHOULDER - 2+ VIEW

[view not recorded (1 of 3)]
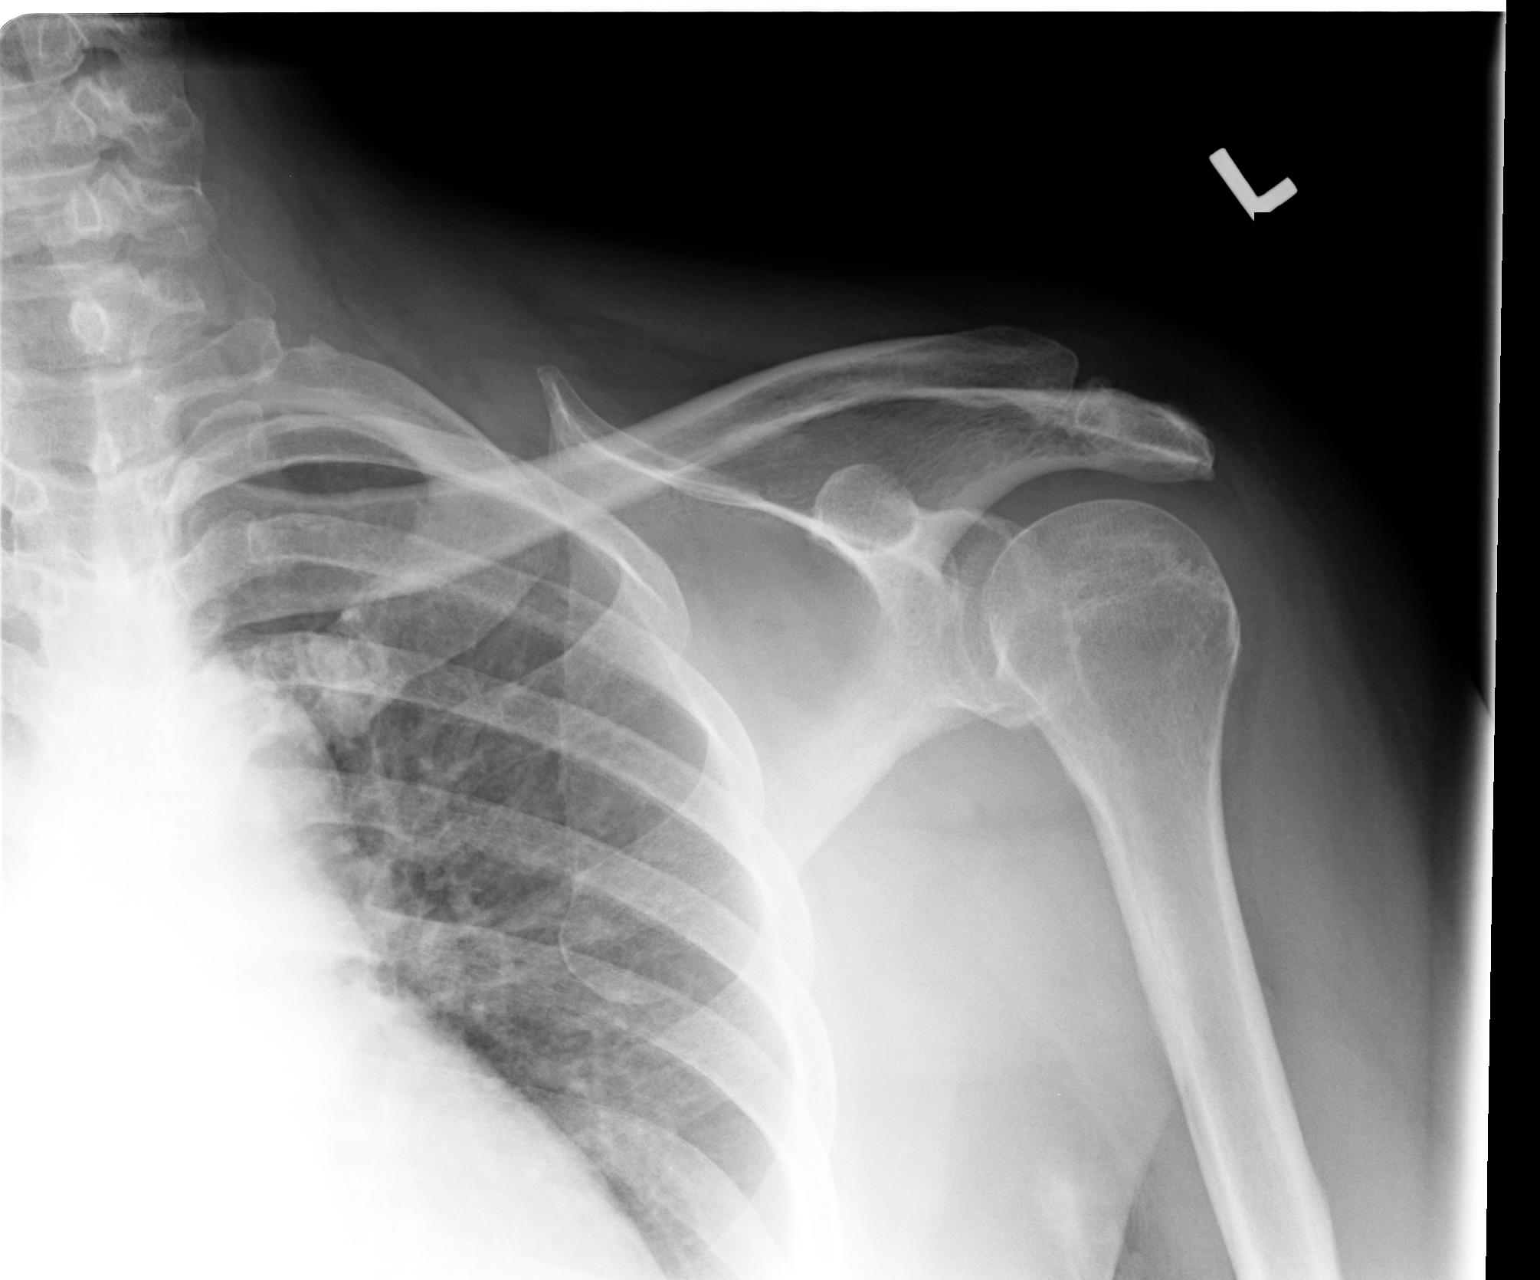

[view not recorded (2 of 3)]
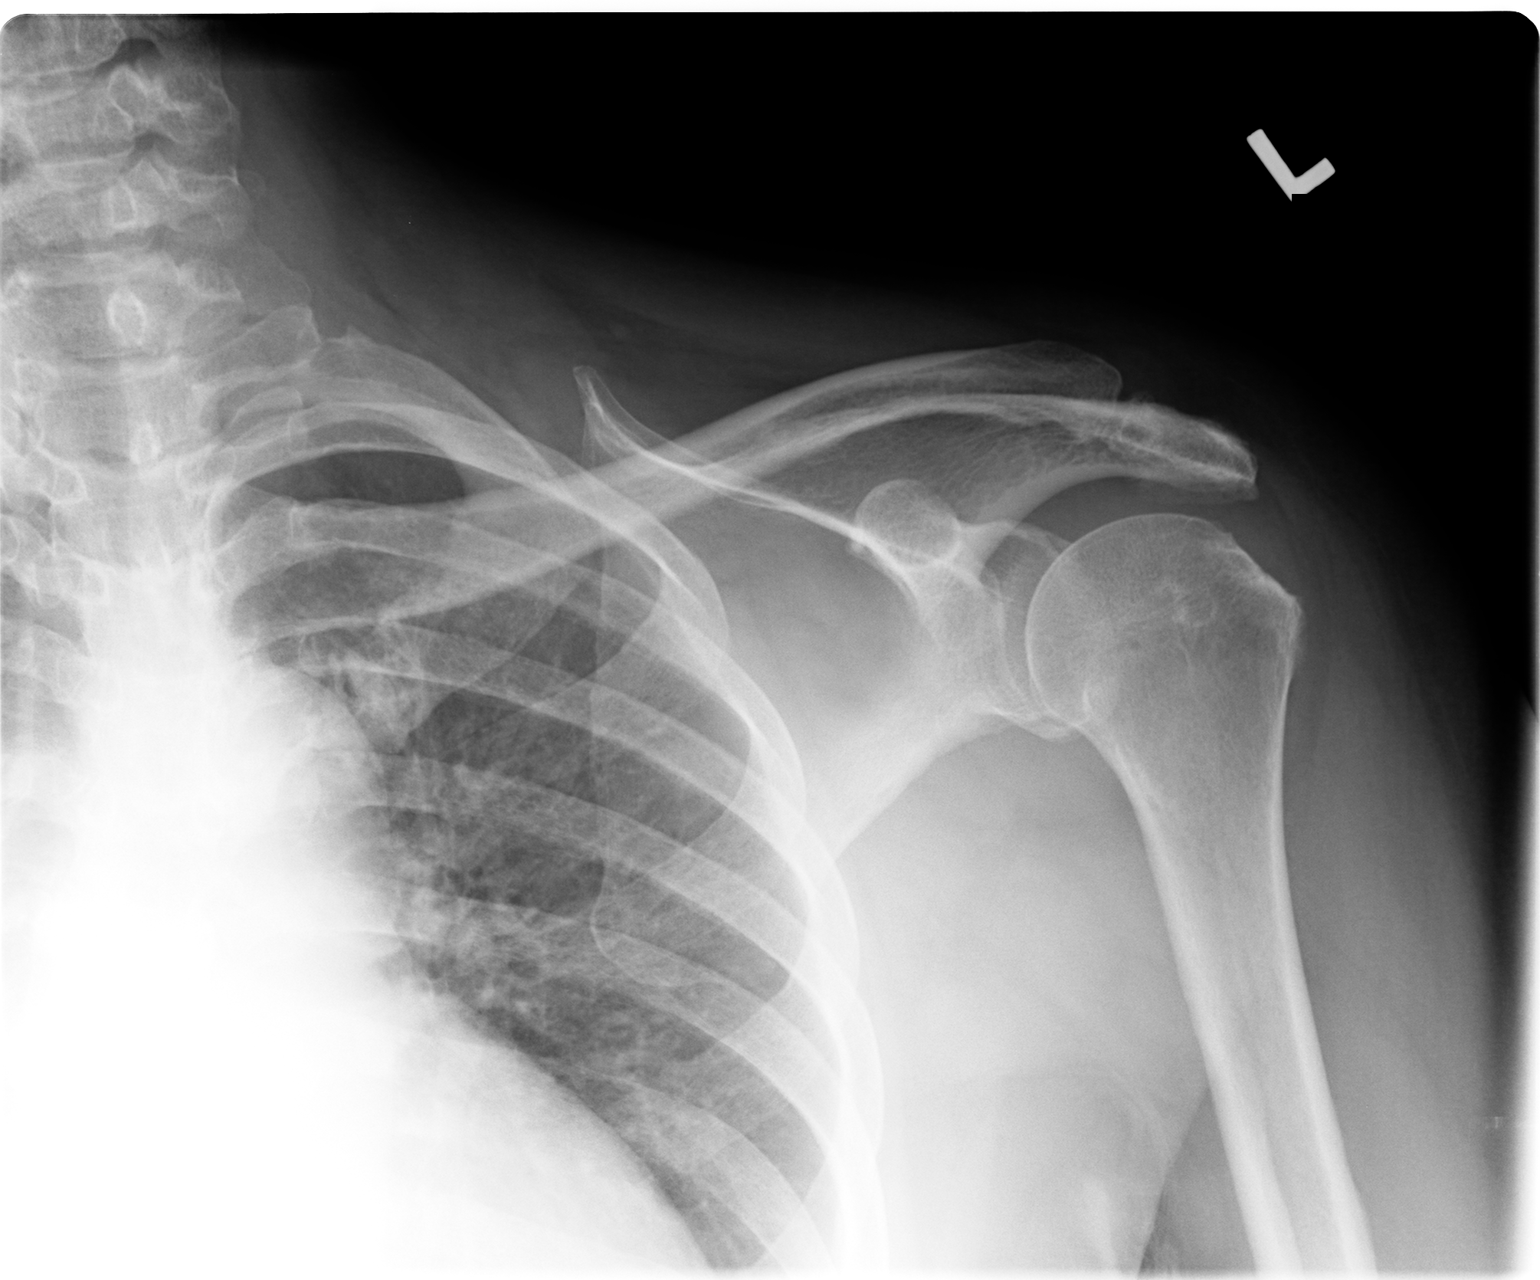

[view not recorded (3 of 3)]
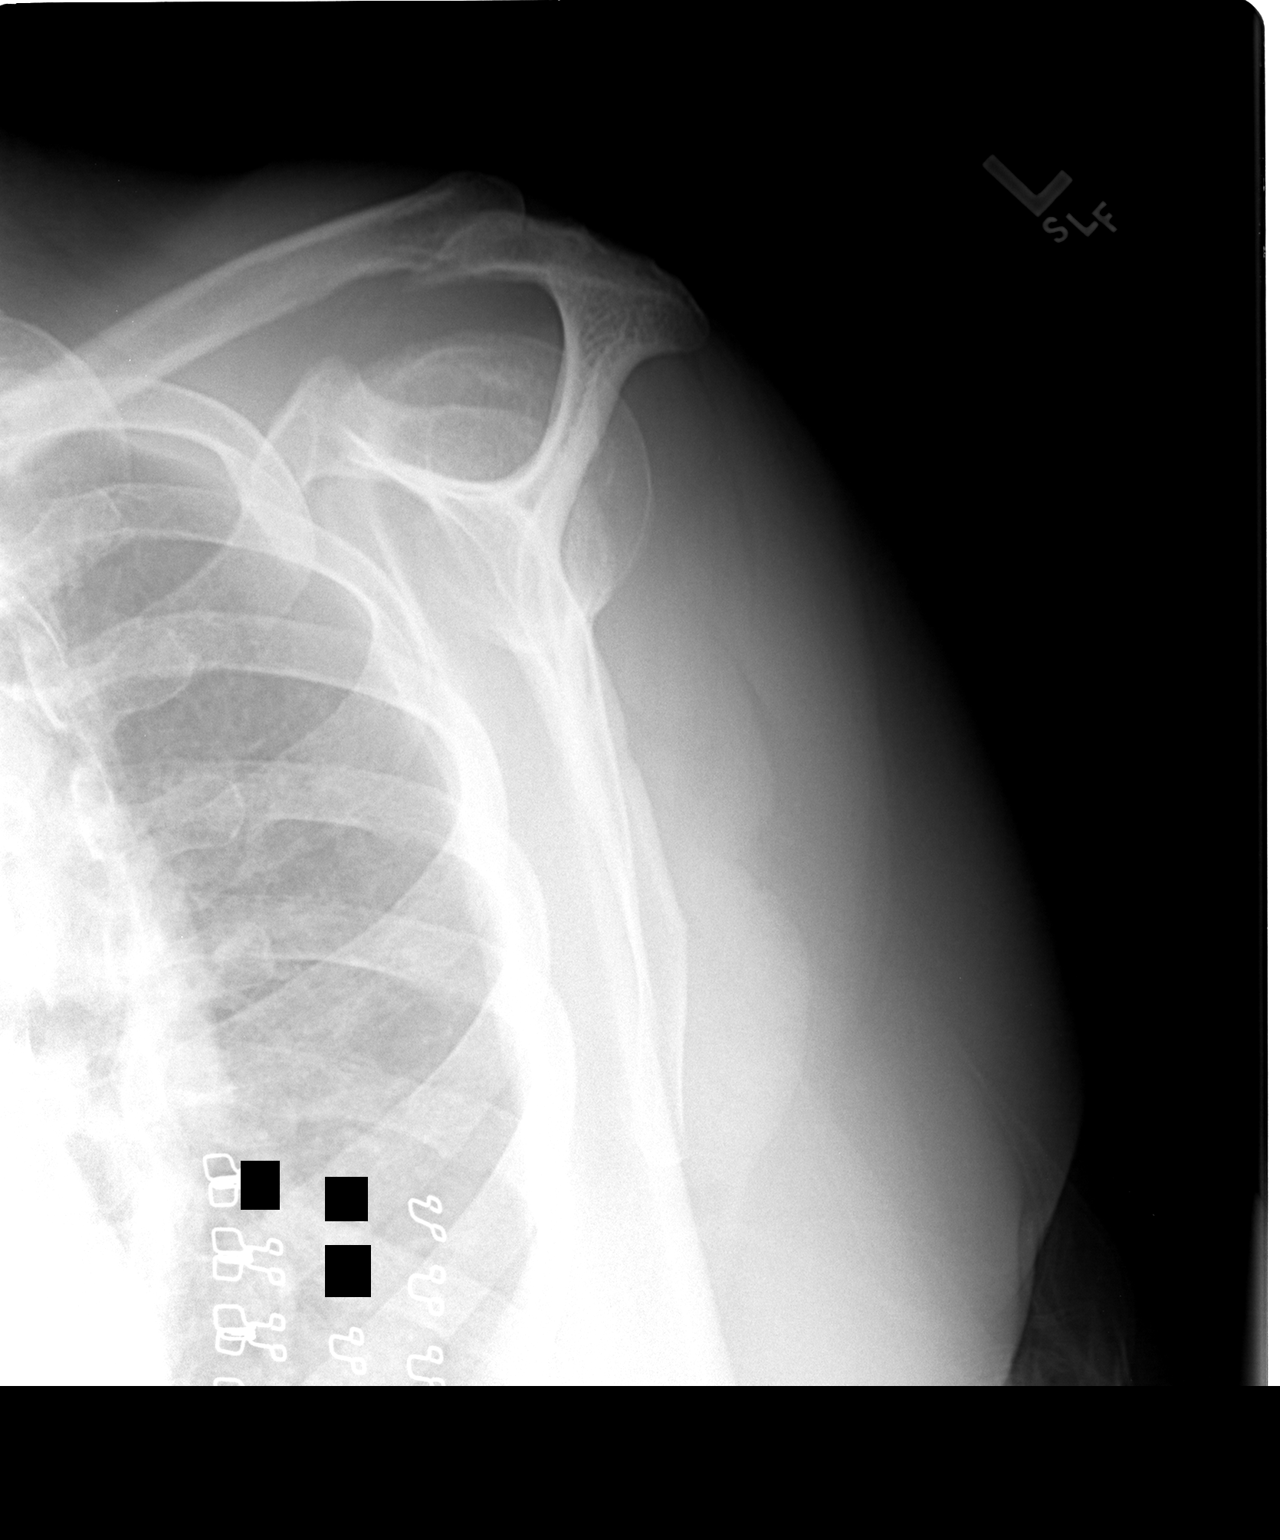

[3 of 3 positions shown; findings below may reference images not displayed]

FINDINGS: Internal and external rotation views appear within normal
limits.  Visualized left upper lung appears within normal limits.
Shoulder is located.  Mild AC joint osteoarthritis.  Type 2
acromion.  No fracture.
IMPRESSION: Mild AC joint degenerative disease.  No acute osseous abnormality
of the left shoulder.

## 2008-11-02 ENCOUNTER — Encounter: Admission: RE | Admit: 2008-11-02 | Discharge: 2008-11-02 | Payer: Self-pay | Admitting: Internal Medicine

## 2008-11-02 IMAGING — MG MM SCREEN MAMMOGRAM BILATERAL
4 series · 4 of 4 positions shown · non-contrast
Comparison: none

DG SCREEN MAMMOGRAM BILATERAL
Bilateral CC and MLO view(s) were taken.
Technologist: MORYS(MORYS)

DIGITAL SCREENING MAMMOGRAM WITH CAD:
The breast tissue is heterogeneously dense.  No masses or malignant type calcifications are 
identified.  Compared with prior studies.
Images were processed with CAD.

[R CC]
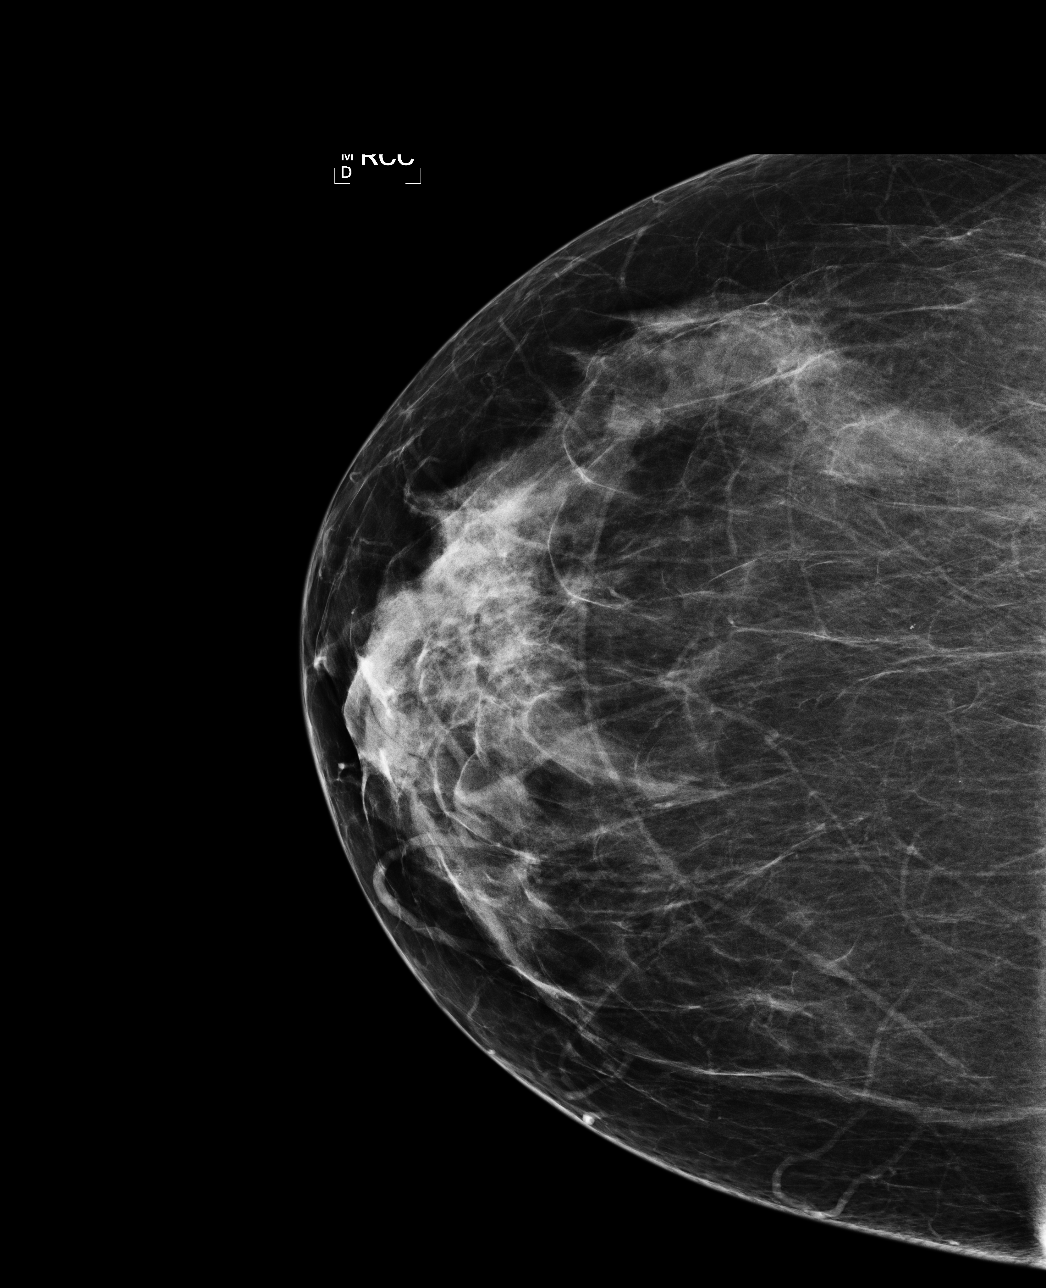

[L CC]
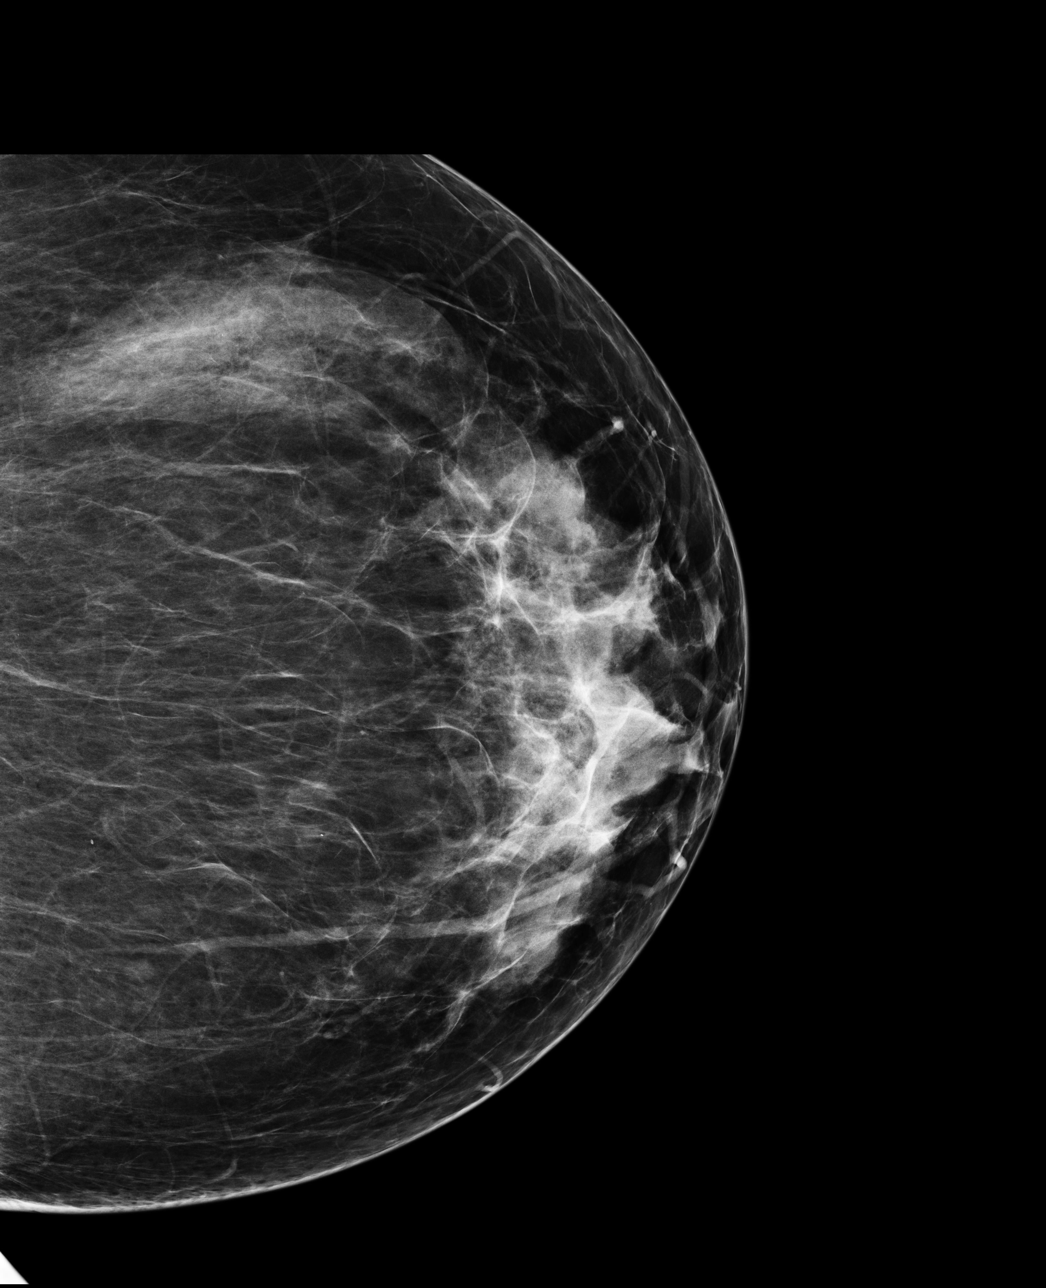

[L MLO]
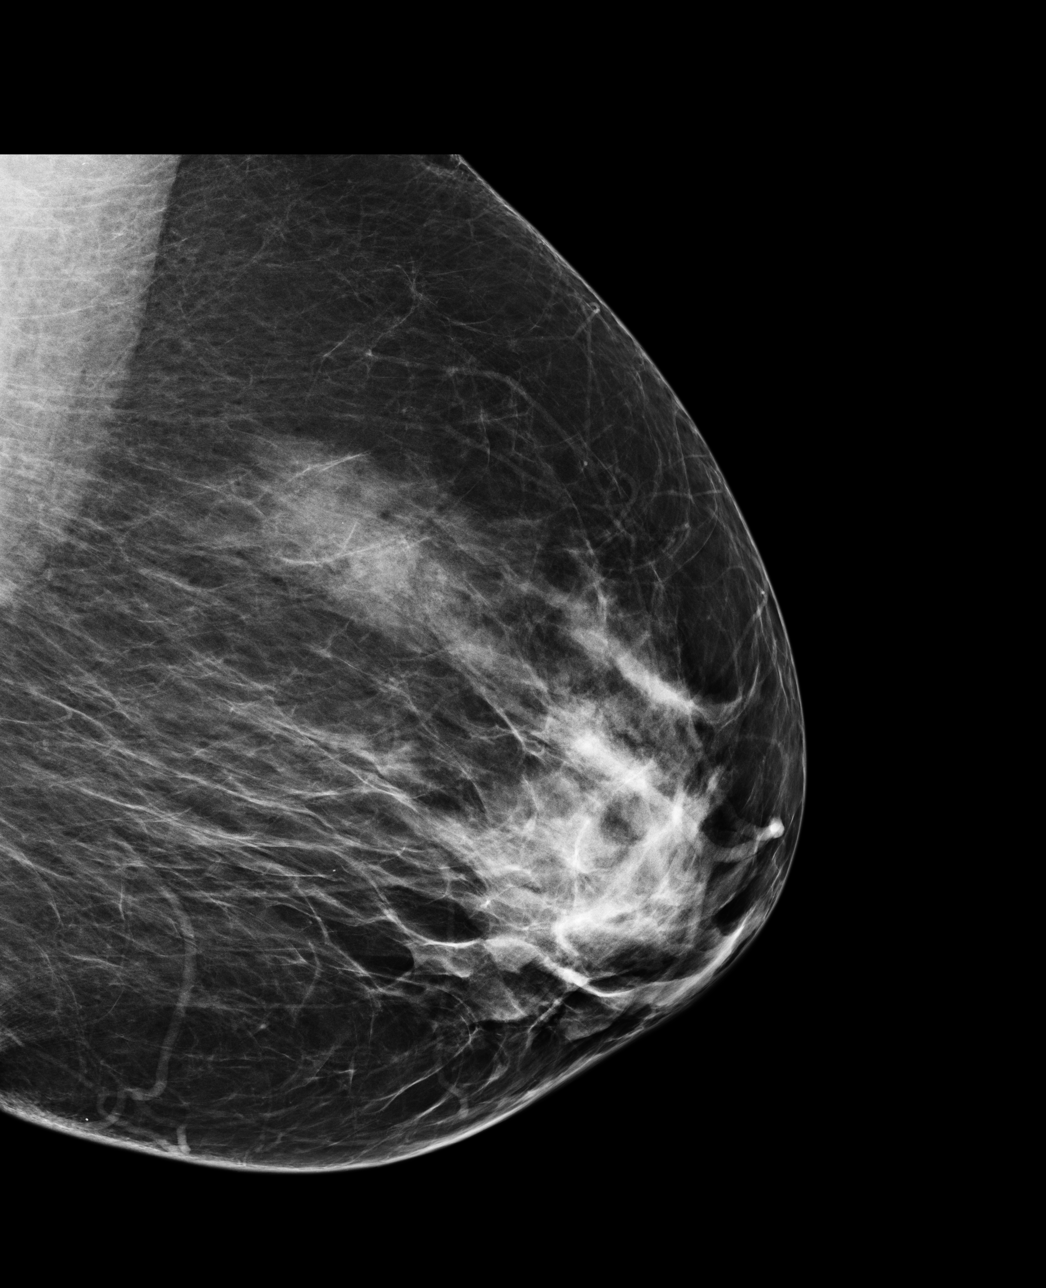

[R MLO]
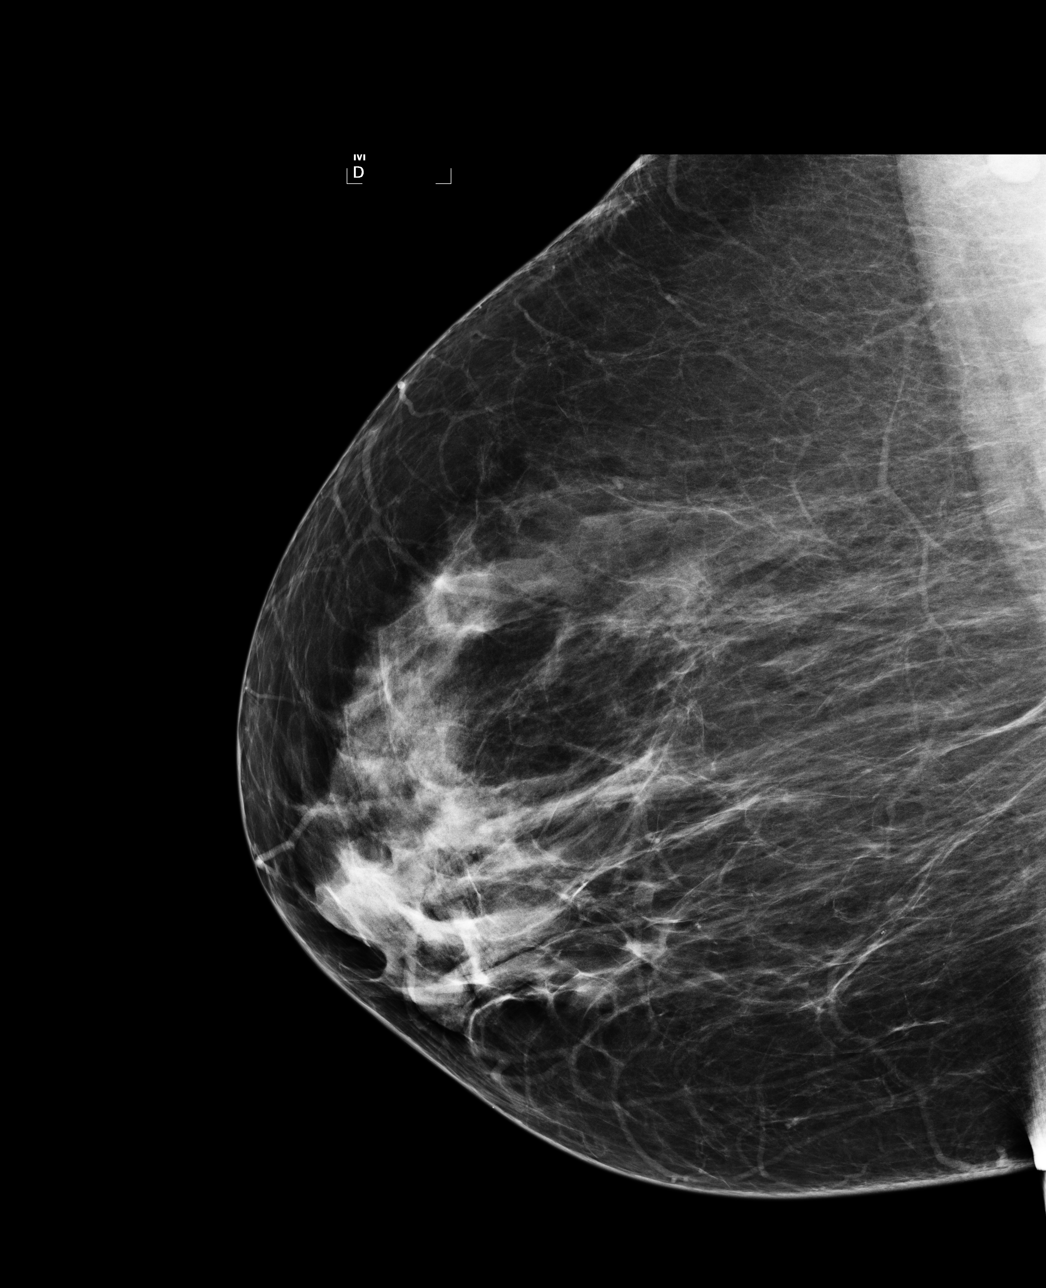

[4 of 4 positions shown; findings below may reference images not displayed]

IMPRESSION: No specific mammographic evidence of malignancy.  Next screening mammogram is recommended in one 
year.

A result letter of this screening mammogram will be mailed directly to the patient.

ASSESSMENT: Negative - BI-RADS 1

Screening mammogram in 1 year.
,

## 2009-08-17 ENCOUNTER — Ambulatory Visit: Payer: Self-pay | Admitting: Internal Medicine

## 2009-08-17 ENCOUNTER — Encounter (INDEPENDENT_AMBULATORY_CARE_PROVIDER_SITE_OTHER): Payer: Self-pay | Admitting: Family Medicine

## 2009-08-17 LAB — CONVERTED CEMR LAB
AST: 12 units/L (ref 0–37)
BUN: 21 mg/dL (ref 6–23)
Basophils Absolute: 0 10*3/uL (ref 0.0–0.1)
Basophils Relative: 0 % (ref 0–1)
CO2: 25 meq/L (ref 19–32)
Calcium: 9.2 mg/dL (ref 8.4–10.5)
Chloride: 102 meq/L (ref 96–112)
HDL: 42 mg/dL (ref >39)
LDL Cholesterol: 96 mg/dL (ref 0–99)
Lymphs Abs: 3.1 10*3/uL (ref 0.7–4.0)
MCV: 84.3 fL (ref 78.0–100.0)
Microalb, Ur: 1.7 mg/dL (ref 0.00–1.89)
Monocytes Absolute: 0.3 10*3/uL (ref 0.1–1.0)
Monocytes Relative: 5 % (ref 3–12)
Neutro Abs: 3.9 10*3/uL (ref 1.7–7.7)
Neutrophils Relative %: 52 % (ref 43–77)
RBC: 4.14 M/uL (ref 3.87–5.11)
Sodium: 138 meq/L (ref 135–145)
Total Protein: 7.6 g/dL (ref 6.0–8.3)
Triglycerides: 146 mg/dL (ref <150)
WBC: 7.5 10*3/uL (ref 4.0–10.5)

## 2009-08-18 ENCOUNTER — Encounter (INDEPENDENT_AMBULATORY_CARE_PROVIDER_SITE_OTHER): Payer: Self-pay | Admitting: Family Medicine

## 2009-08-18 LAB — CONVERTED CEMR LAB
Folate: 12.6 ng/mL
Iron: 44 ug/dL (ref 42–145)
Saturation Ratios: 14 % — ABNORMAL LOW (ref 20–55)
UIBC: 278 ug/dL

## 2009-08-24 ENCOUNTER — Ambulatory Visit: Payer: Self-pay | Admitting: Internal Medicine

## 2009-09-20 ENCOUNTER — Ambulatory Visit: Payer: Self-pay | Admitting: Internal Medicine

## 2009-11-03 ENCOUNTER — Encounter: Admission: RE | Admit: 2009-11-03 | Discharge: 2009-11-03 | Payer: Self-pay | Admitting: Internal Medicine

## 2009-11-03 IMAGING — MG MM DIGITAL SCREENING
4 series · 4 of 4 positions shown · non-contrast
Comparison: Prior studies.

DG SCREEN MAMMOGRAM BILATERAL
Bilateral CC and MLO view(s) were taken.

DIGITAL SCREENING MAMMOGRAM WITH CAD:

[R CC]
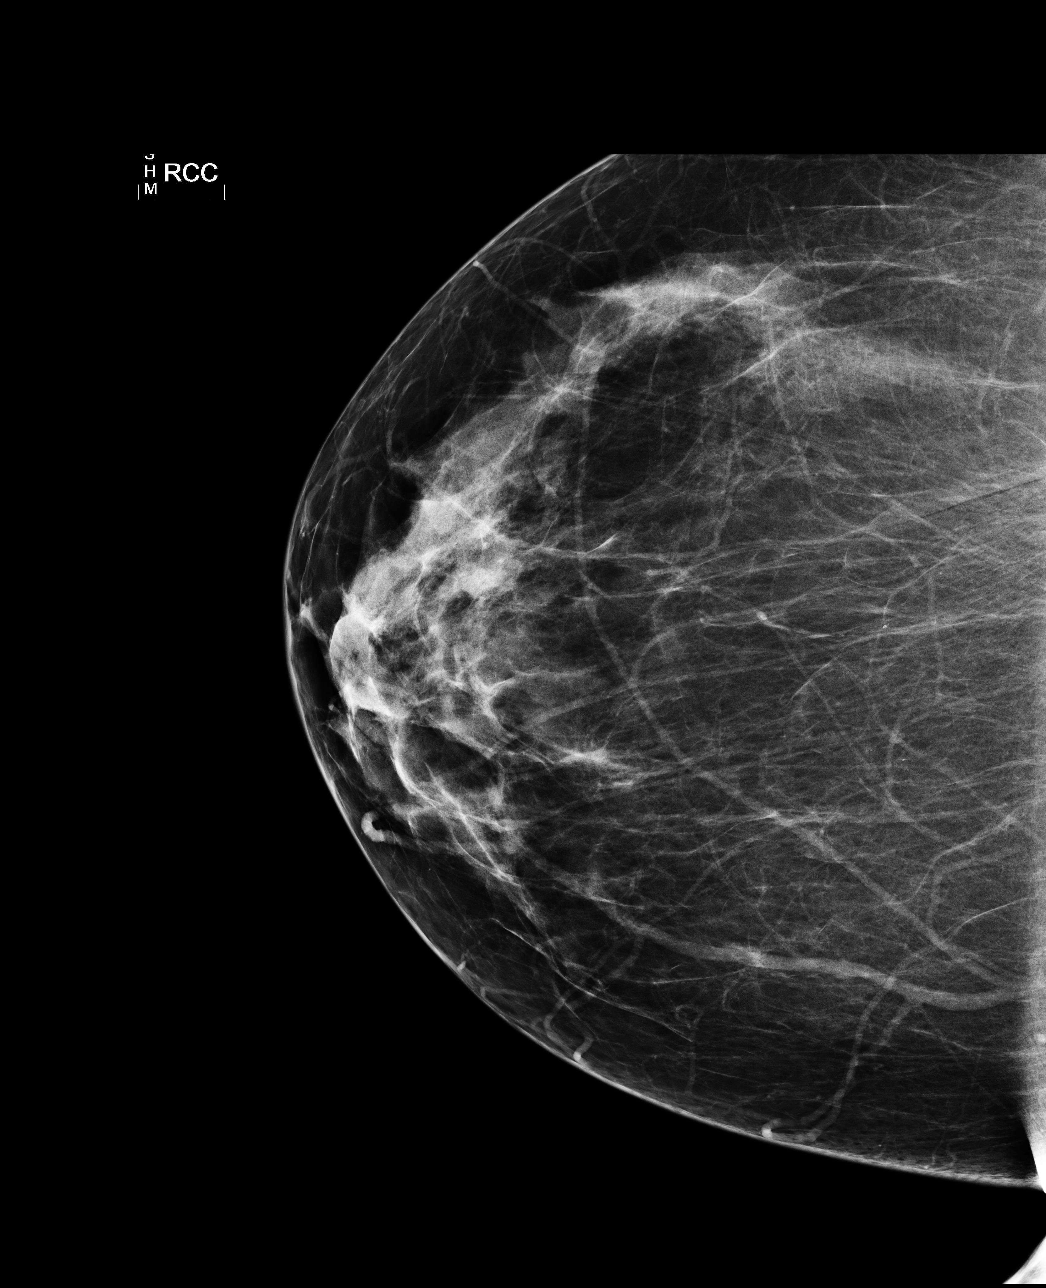

[L CC]
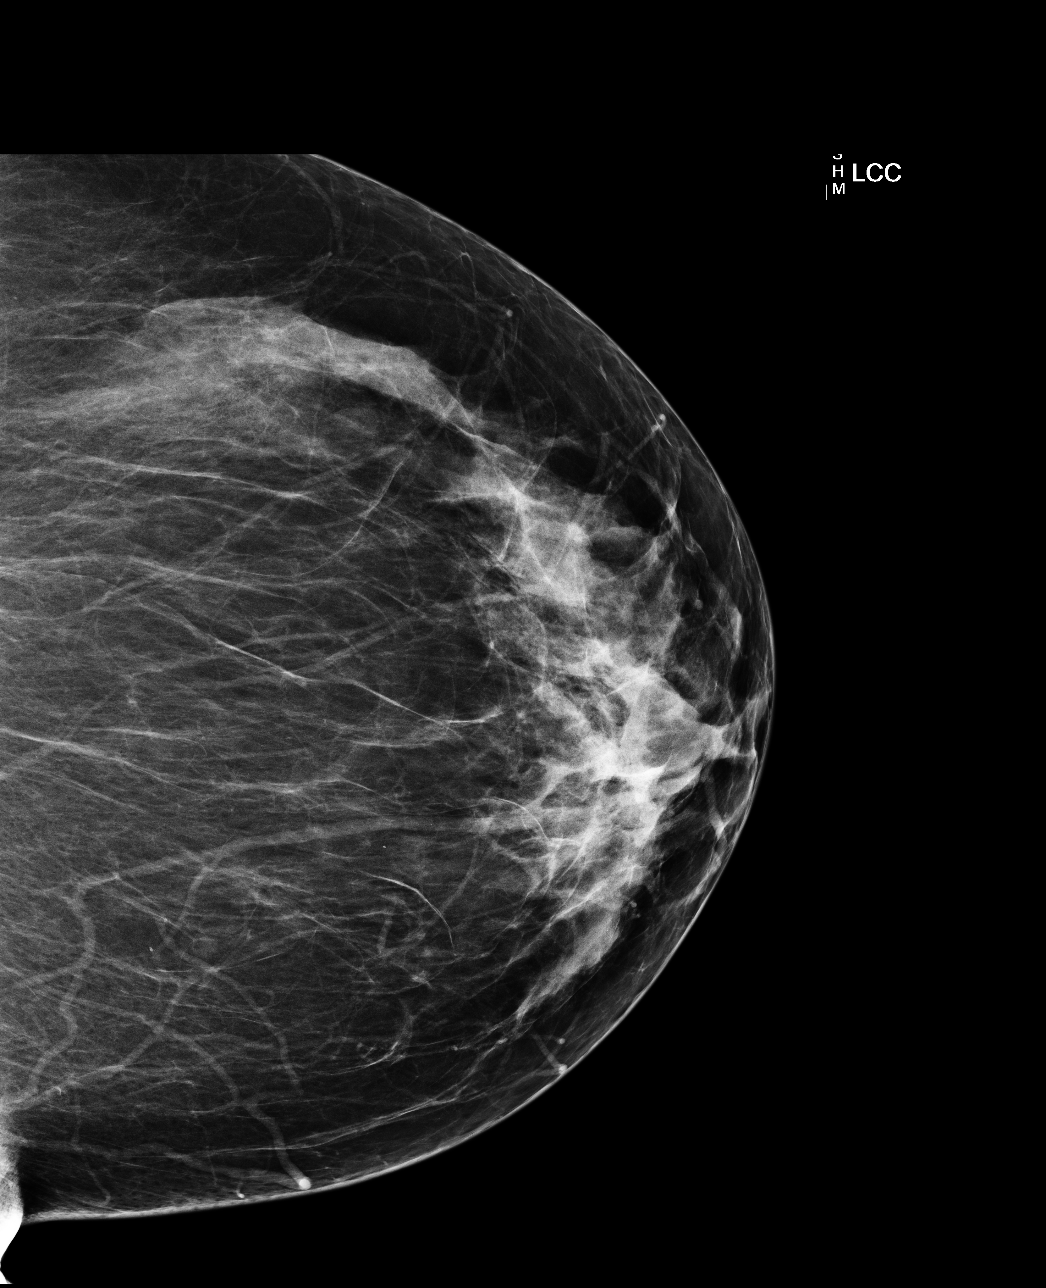

[L MLO]
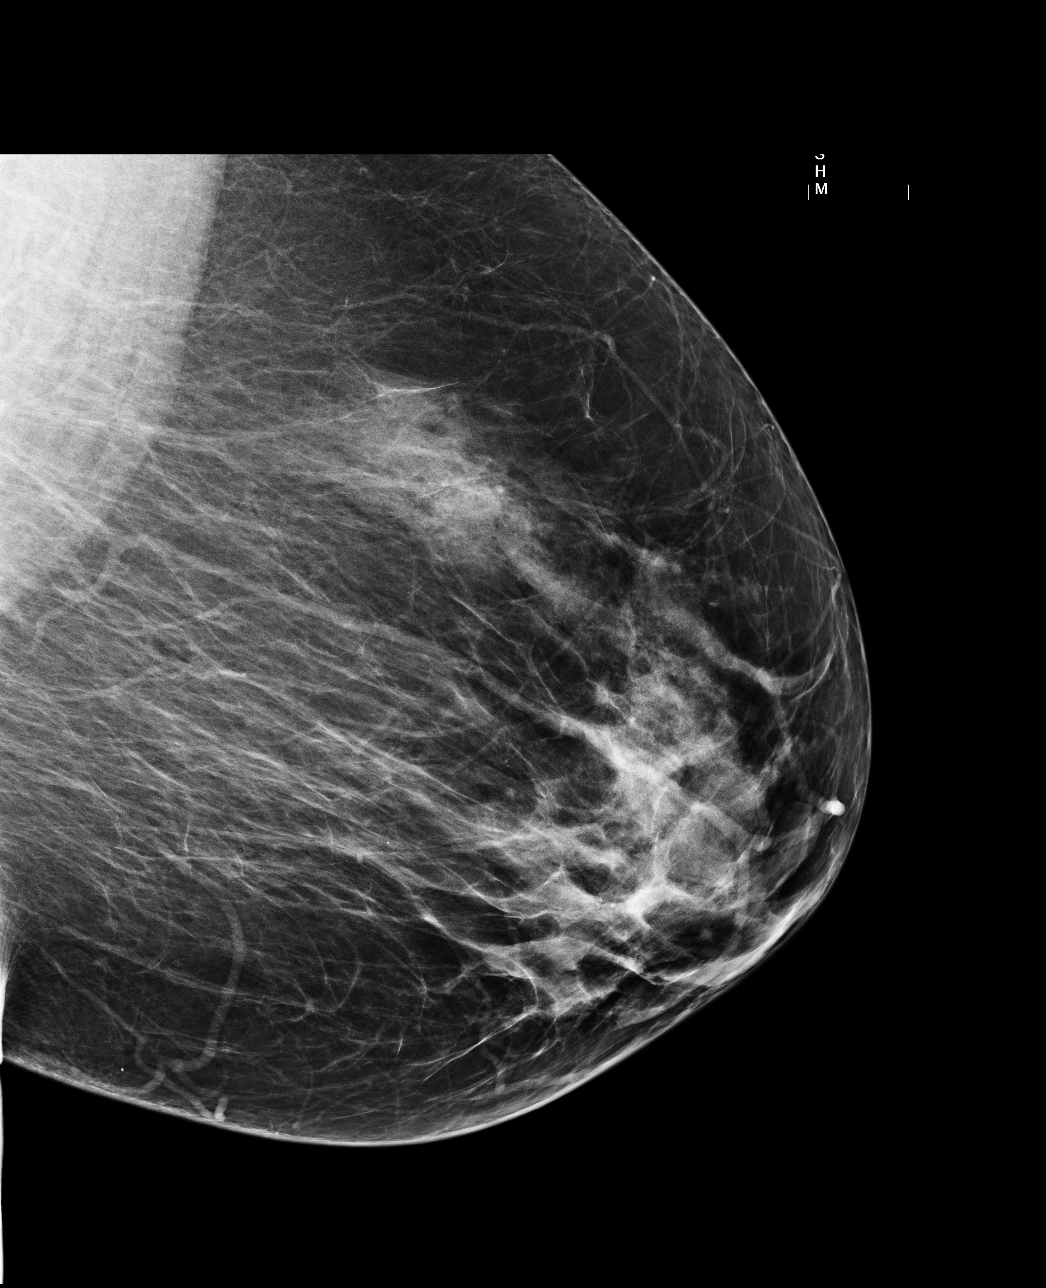

[R MLO]
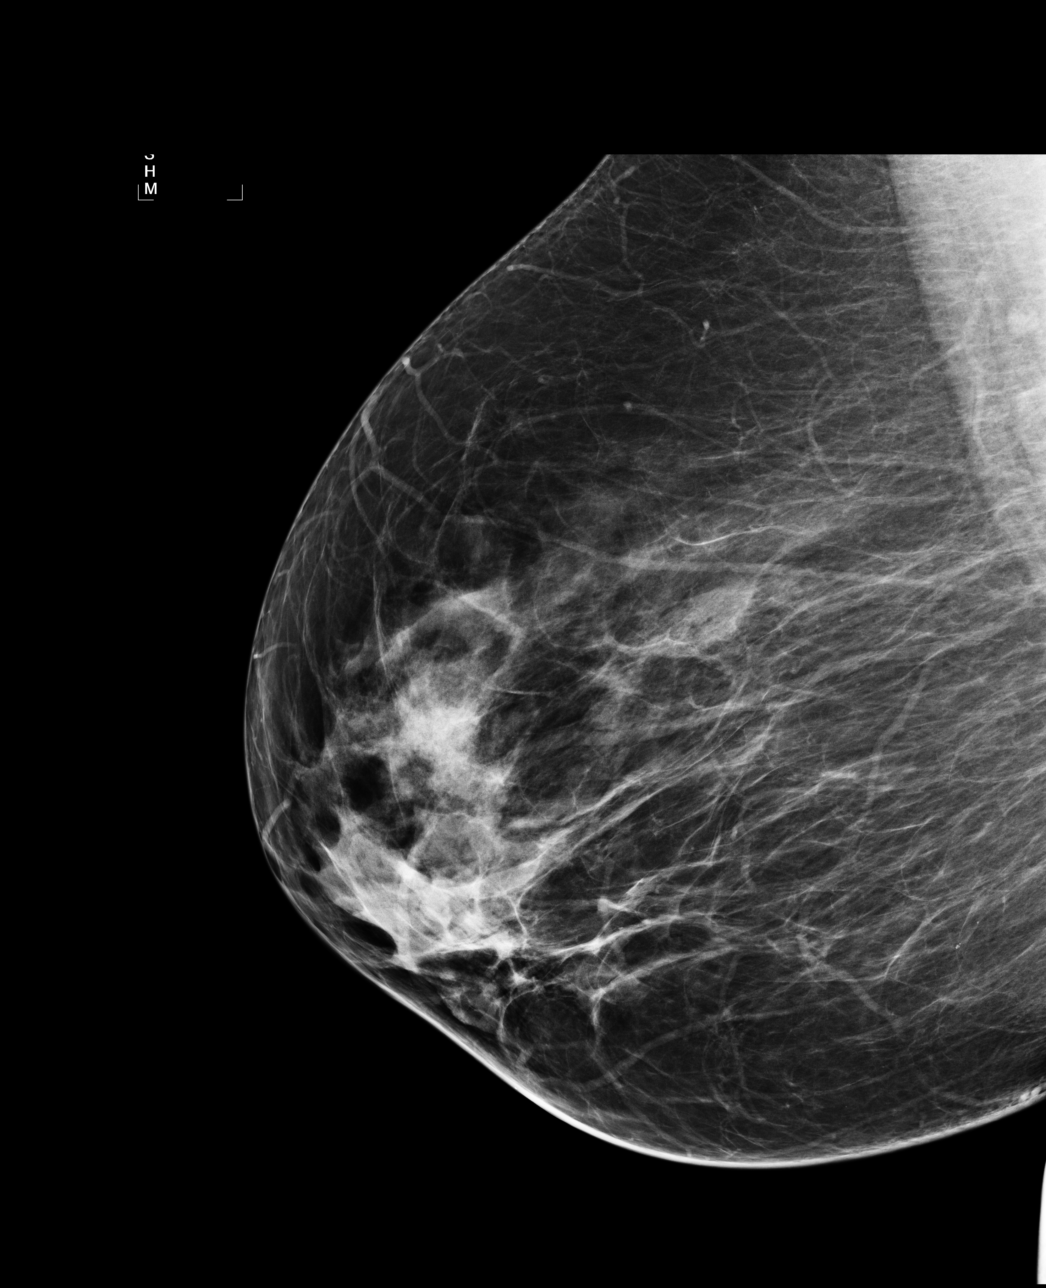

[4 of 4 positions shown; findings below may reference images not displayed]

There are scattered fibroglandular densities.  There is no dominant mass, architectural distortion 
or calcification to suggest malignancy.

Images were processed with CAD.
IMPRESSION: No mammographic evidence of malignancy.  Suggest yearly screening mammography.

A result letter of this screening mammogram will be mailed directly to the patient.

ASSESSMENT: Negative - BI-RADS 1

Screening mammogram in 1 year.
,

## 2010-02-11 ENCOUNTER — Encounter: Payer: Self-pay | Admitting: Family Medicine

## 2010-06-05 NOTE — Op Note (Signed)
[name], [name]                 ACCOUNT NO.:  [id number]   MEDICAL RECORD NO.:  [id number]          PATIENT TYPE:  AMB   LOCATION:  DSC                          FACILITY:  [facility]   PHYSICIAN:  Thornton Park. Daphine Deutscher, MD  DATE OF BIRTH:  03-Jul-1955   DATE OF PROCEDURE:  12/11/2006  DATE OF DISCHARGE:                               OPERATIVE REPORT   PREOPERATIVE DIAGNOSIS:  A 55 year old African American female with an  umbilical hernia that has been painful.  CT scan showed that it had fat  in it.   PROCEDURE:  Umbilical hernia repair with mesh.   SURGEON:  Thornton Park. Daphine Deutscher, MD   ANESTHESIA:  General endotracheal.   DESCRIPTION OF PROCEDURE:  Brittany Klein was taken back to room 6 at [facility]  Day surgery and given general anesthesia.  The abdomen was prepped with  Techni-Care and draped sterilely.  We then made a transverse incision  above the umbilicus and carried this down and encountered the herniated  properitoneal fat which I easily dissected free with neck.  It would  with a accommodate the tip of my index finger.  I cut around that down  to the fascia and then stripped it away and then inserted a small Bard  Ventralex hernia patch 1.7 inches in diameter into this hole which I  then sutured as I closed it in a transverse manner with interrupted 0  Prolene.  After I tied these together, the hernia was completely gone.  There is a little tiny weakness just above the umbilicus which I  approximated with an extra separate suture of 0 Prolene.  Area was  injected with 0.5% Marcaine and closed  with 4-0 Vicryl, with Benzoin  Steri-Strips.  The patient tolerated procedure well.  She was given  Vicodin for pain, instructed to return to the office in three weeks.      Thornton Park Daphine Deutscher, MD  Electronically Signed     MBM/MEDQ  D:  12/11/2006  T:  12/12/2006  Job:  [id number]   cc:   Bryan Lemma. Manus Gunning, M.D.

## 2010-06-08 NOTE — Consult Note (Signed)
Brittany Klein, Brittany Klein                           ACCOUNT NO.:  [id number]   MEDICAL RECORD NO.:  [id number]                   PATIENT TYPE:  INP   LOCATION:  5001                                 FACILITY:  [facility]   PHYSICIAN:  [place] Bing, M.D.               DATE OF BIRTH:  04-Jul-1955   DATE OF CONSULTATION:  11/19/2002  DATE OF DISCHARGE:                                   CONSULTATION   REPORT TITLE:  CARDIOLOGY CONSULTATION   CONSULTING PHYSICIAN:  Gerrit Friends. Dietrich Pates, M.D.   REFERRING PHYSICIAN:  Lovenia Kim, D.O.   PRIMARY CARDIOLOGIST:  Possibly Dr. Royetta Asal; patient recalls evaluation at  the [facility] office but not the specific physician.   HISTORY OF PRESENT ILLNESS:  The patient is a 55 year old woman with  intermittent episodes of diaphoresis and palpitations in the past found to  be PSVT.  She was evaluated approximately five years ago with a negative  workup including a stress test and presumably an echocardiogram.  She was  treated with beta blocker for a time but tolerated this poorly due to low  blood pressure and eventually discontinued the medication.  She has had  occasional episodes since that resolve spontaneously.  She is not aware of  mechanisms she can use to stop her SVT.  There is no apparent precipitating  cause.   Brittany Klein was admitted for elective left total knee replacement surgery  which was complicated by postoperative anemia.  She was found to have a  rapid heart rate, prompting referral.   PAST MEDICAL HISTORY:  1. Diabetes.  2. Hyperlipidemia.   CURRENT MEDICATIONS:  1. Amaryl 4 mg daily.  2. Metformin 1 g b.i.d.  3. Simvastatin 20 mg daily.  4. She is also receiving Lovenox, warfarin, and metoprolol in the hospital.   SOCIAL HISTORY:  Single, with two daughters.  Discontinued cigarette smoking  three years ago.  Does not use alcohol currently but did have excessive use  in the past.  Works at [employer].   FAMILY HISTORY:   Mother has CHF and SVT, in her 51s.  She suffered a MI many  years ago.  She also has a history of CVA and DVT.  Father also is living  and also has a history of CHF.   REVIEW OF SYSTEMS:  Urinary frequency.  Occasional depression.  Intermittent  pruritus.  All other systems negative.   PHYSICAL EXAMINATION:  GENERAL:  Pleasant, comfortable woman lying in bed in  a knee immobilizer.  VITAL SIGNS:  The heart rate is 144 and regular, respirations 20, blood  pressure 100/60.  HEENT:  Anicteric sclerae.  NECK:  No jugular venous distention.  ENDOCRINE:  No thyromegaly.  LUNGS:  Clear.  CARDIAC:  Rapid rhythm.  Normal first and second heart sounds.  ABDOMEN:  Soft and nontender.  EXTREMITIES:  No edema.  SKIN:  No significant lesions.  LABORATORY DATA:  Chest x-ray:  Peribronchial thickening.   EKG:  PSVT.  Rate of 137.  Poor R-wave progression.   White count 13.5, hemoglobin 9.3 following transfusion, and normal  platelets.  Potassium 3.5.   IMPRESSION:  Brittany Klein has recurrent paroxysmal supraventricular  tachycardia, probably without structural heart disease.  Although she is  asymptomatic, conversion to sinus is warranted and will be undertaken with  intravenous medication.  We will discontinue metoprolol and start diltiazem  at a dose of 60 mg three times daily.   We will be happy to follow this nice woman with you in the hospital and will  plan to see her in the office to discuss long-term management of her  arrhythmia.                                               [place] Bing, M.D.    RR/MEDQ  D:  11/19/2002  T:  11/19/2002  Job:  [id number]   cc:   Lovenia Kim, D.O.  [address]  [place]  [place] [address]  Fax: [phone]   G. Dorene Grebe, M.D.  [address]  [place]  [place] [address]  Fax: [phone]

## 2010-06-08 NOTE — Discharge Summary (Signed)
Brittany Klein, Brittany Klein                           ACCOUNT NO.:  [id number]   MEDICAL RECORD NO.:  [id number]                   PATIENT TYPE:  INP   LOCATION:  5023                                 FACILITY:  [facility]   PHYSICIAN:  Burnard Bunting, M.D.                 DATE OF BIRTH:  01-07-1956   DATE OF ADMISSION:  11/16/2002  DATE OF DISCHARGE:  11/24/2002                                 DISCHARGE SUMMARY   DISCHARGE DIAGNOSIS:  Left knee arthritis.   SECONDARY DIAGNOSES:  1. Diabetes mellitus.  2. History of supraventricular tachycardia.   OPERATION AND PROCEDURES:  Left total knee arthroplasty performed November 16, 2002.   HOSPITAL COURSE:  Brittany Klein is a 55 year old patient with left knee  arthritis.  She underwent left total knee arthroplasty on November 16, 2002.  The patient tolerated the procedure well without any complications.  She was  started on CPM and Lovenox for DVT prophylaxis on postoperative day #1.  The  patient was noted to have hematocrit of 22.9 and received 1 unit of packed  red blood cell transfusion.  Her preoperative hematocrit was 33.  She was  noted to have an elevated heart rate on postop day #2; cardiology  consultation was obtained and the patient was started on IV Cardizem and  converted to normal sinus rhythm.  The patient was maintained on Coumadin  for DVT prophylaxis.  The patient continued on her CPM machine and achieved  excellent range of motion by the time of discharge.  INR was increasing at  the time of discharge.  Incision was intact on postoperative day #6.  Ultrasound was negative for DVT at the time of discharge.  The patient was  mobilizing well and was transferred to home with home health care and PT.   FOLLOWUP:  She will follow up with me in one week from discharge for suture  removal.   DISCHARGE MEDICATIONS:  Discharge medications include preadmission  medications plus Cardizem, Coumadin and Percocet.                         Burnard Bunting, M.D.    GSD/MEDQ  D:  12/30/2002  T:  01/01/2003  Job:  [id number]

## 2010-06-08 NOTE — Consult Note (Signed)
Brittany Klein, Brittany Klein                           ACCOUNT NO.:  [id number]   MEDICAL RECORD NO.:  [id number]                   PATIENT TYPE:  REC   LOCATION:  FOOT                                 FACILITY:   PHYSICIAN:  Jonelle Sports. Sevier, M.D.              DATE OF BIRTH:  02/17/1955   DATE OF CONSULTATION:  01/06/2003  DATE OF DISCHARGE:                                   CONSULTATION   HISTORY:  This 55 year old black female was seen at the courtesy of Dr.  Elisabeth Most for assistance with management of painful calluses of both feet.   The patient has had type 2 diabetes diagnosed for approximately 10 years and  is in good control on oral agents with recent hemoglobin A1c of 6.2%.  She  has had some mild deformities of her feet that have been present for years  including some clawing of the toes.  In fact, years ago had a straightening  procedure to the second toes of both feet which has subsequently given way.  She also has a tendency to pes planus and some degree of pronation on her  right foot.  She has had painful calluses that recur from time to time on  the plantar aspects of her feet and historically has taken care of these  herself through the use of a razorblade.  She has recently come to realize  the lack of wisdom in that and so presents here today for our assistance.  Her calluses at present have become so uncomfortable that it is very  difficult for her to walk she says.  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   PAST MEDICAL HISTORY:  Notable for obesity, SVT, iron deficiency,  hyperlipidemia, depression and degenerative arthritis with previous left  knee replacement.  She has also had carpal tunnel surgery.   REGULAR MEDICATIONS:  1. Glucophage XL.  2. Lipitor.  3. Amaryl.  4. Diltiazem XR.  5. Baby aspirin daily.   ALLERGIES:  CATAFLAM.   PHYSICAL EXAMINATION:  Examination  today is limited to the distal lower  extremities.  The feet are free of edema and are mildly deformed  characterized by some degree of pes planus and pronation on the right and  also clawing of the second through fifth toes bilaterally.  There is no  significant edema.  Skin temperature is normal and symmetrical.  Pulses are  everywhere palpable and quite adequate.  Monofilament testing shows  preservation of protective sensation throughout.  There is dry skin and  callus formation underlying heals and mid feet bilaterally.  This in a sort  of a general distribution, but there are specific calluses some with central  cores on the hallices, the first metatarsal head areas and the third and  fourth metatarsal head areas bilaterally as well as one  posteriorly at the  anterolateral aspect of the left heel.   DISPOSITION:  1. The patient is given instruction regarding foot care and diabetes by     video with nurse and physician reinforcement.  2. Specifically, it is discussed with the patient that should she get to the     point of loss of protective sensation in her feet she will clearly be a     candidate for custom inserts.  It is further recommended to her that she     allow her ongoing foot care to be provided by this facility or some     comparable facility and that she not attempt to do it herself.  3. The heels are widely dremeled bilaterally without incident.  4. Calluses at the plantar aspect of the anterior lateral left heel on the     interphalangeal joint areas of both hallices at the first MP joint areas     bilaterally and at the third and fourth MP joint areas bilaterally on the     plantar aspects of the feet are sharply pared without incident.  5. The patient is instructed in the proper use of bag balm to try to prevent     some of this drying.  6. 15% salicylic acid and collodion is applied to the sites on the third     metatarsal heads bilaterally and the fourth  metatarsal head on the right     in an effort to draw the central cores out of these lesions.  7. The patient will be seen again in three weeks for continued efforts at     eliminating these calluses all together.                                               Jonelle Sports. Cheryll Cockayne, M.D.    RES/MEDQ  D:  01/06/2003  T:  01/06/2003  Job:  [id number]   cc:   Lovenia Kim, D.O.  [address]  [place]  [place] [address]  Fax: [phone]

## 2010-06-08 NOTE — Op Note (Signed)
NAME:  Brittany Klein, Brittany Klein                           ACCOUNT NO.:  [id number]   MEDICAL RECORD NO.:  [id number]                   PATIENT TYPE:  INP   LOCATION:  5001                                 FACILITY:  [facility]   PHYSICIAN:  Burnard Bunting, M.D.                 DATE OF BIRTH:  03/31/1955   DATE OF PROCEDURE:  11/16/2002  DATE OF DISCHARGE:                                 OPERATIVE REPORT   PREOPERATIVE DIAGNOSIS:  Left knee arthritis.   POSTOPERATIVE DIAGNOSIS:  Left knee arthritis.   PROCEDURE:  Left total knee arthroplasty.   SURGEON:  Burnard Bunting, M.D.   ASSISTANT:  Jerolyn Shin. Lavender, M.D.   TOURNIQUET TIME:  Two hours 8 minutes at 300 mmHg.   COMPONENTS:  Cemented [employer] [place] posterior stabilized size 7 femur,  size 7 tibia, 12-mm insert, 5 patella, medial offset dome.   PROCEDURE IN DETAIL:  The patient was brought to the operating room where  general endotracheal anesthesia was induced.  Preoperative IV antibiotics  were administered and a femoral block was administered prior to the  procedure.  Left leg, foot and knee were then prepped with Duraprep solution  and draped in a sterile manner.  [name] was used to cover the operative  field.  Leg was elevated, exsanguinated and the Esmarch wrap tourniquet was  inflated.  Anterior incision to the knee was utilized.  Median parapatellar  arthrotomy was made.  Precise location for the arthrotomy was marked with a  0 Vicryl suture.  Patella was everted.  Fat pad was resected.  Minimal  medial periosteal elevation was performed back to the semimembranosus  bursa.  Only about a centimeter was elevated, enough to visualize the medial  tibial plateau.  ACL and PCL were resected.  Generalized osteoarthritis was  present in the knee.  At this time, the lateral patellofemoral ligament was  cut and soft tissue was removed from the proximal anterior aspect of the  femur.  The 12-mm distal cut was then made using intramedullary  alignment  with the guide set at 5 degrees of valgus for the left knee.  The cutting  guide was then placed and a size 7 femur was noted to fit well on the cut  bony surface.  The chamfer cutting block, size 7, was then placed.  The  anterior, posterior and chamfer cuts were then performed.  At this time, the  tibia was then prepared.  Intramedullary alignment was utilized.  About an 8-  mm resection was measured off the least affected medial tibial plateau  surface.  The cut was perpendicular to the mechanical axis of the tibia.  Following the resection of the tibial plateau surface with the collateral  and posterior retractors in position, the box cut was made on the femur.  Trial components were placed.  The knee was found to have good  hyperextension with  both the 10- and 12-mm spacers.  At this time, rotation  of the components was marked.  Patella was then prepared.  The patella  measured 22 mm with calipers.  A 10-mm resection was made free-hand using  the oscillating saw.  A size 5 medial offset dome patella was noted to fit  well within the patellar surface.  This trial patella was placed and the  knee was taken through a range of motion with the 12 trial insert and the  patient had good collateral stability, good overall alignment, full range of  motion and no lift off and excellent patellar tracking.  At this time, trial  components were removed, cut bony surfaces were irrigated.  PCL remnant was  resected.  Posterior capsular stripping and osteophyte removal were  performed.  The components were then cemented into position with excess  cement removed.  This included a size 7 tibia with a 7 femur, 5 patella and  12 base plate.  Following placement of the prostheses, the tourniquet was  released, bleeding points encountered controlled using electrocautery.  The  peripatellar arthrotomy was then closed over a drain using #1 Vicryl suture.  The knee was closed in about 40 degrees of  flexion.  Watertight closure was  achieved.  Skin and subcutaneous tissue were irrigated and closed using  interrupted inverted 2-0 Vicryl and skin staples.  The patient was then  transferred to the recovery room in stable condition.  Bulky dressing and  knee immobilizer were placed.   DICTATION ENDED AT THIS POINT.                                               Burnard Bunting, M.D.    GSD/MEDQ  D:  11/17/2002  T:  11/18/2002  Job:  [id number]

## 2010-10-16 ENCOUNTER — Other Ambulatory Visit: Payer: Self-pay | Admitting: Internal Medicine

## 2010-10-16 DIAGNOSIS — Z1231 Encounter for screening mammogram for malignant neoplasm of breast: Secondary | ICD-10-CM

## 2010-10-30 LAB — I-STAT 8, (EC8 V) (CONVERTED LAB)
BUN: 17
Bicarbonate: 25.3 — ABNORMAL HIGH
Chloride: 105
Operator id: 279391
Potassium: 4.2
TCO2: 27
pH, Ven: 7.339 — ABNORMAL HIGH

## 2010-10-30 LAB — POCT HEMOGLOBIN-HEMACUE: Operator id: 128471

## 2010-11-02 LAB — DIFFERENTIAL
Eosinophils Absolute: 0.1
Eosinophils Relative: 1
Lymphocytes Relative: 38
Lymphs Abs: 1.9
Monocytes Relative: 6

## 2010-11-02 LAB — CBC
HCT: 29.2 — ABNORMAL LOW
MCV: 80.3
RBC: 3.63 — ABNORMAL LOW
WBC: 5.1

## 2010-11-02 LAB — CHROMOSOME ANALYSIS, BONE MARROW

## 2010-11-06 ENCOUNTER — Ambulatory Visit
Admission: RE | Admit: 2010-11-06 | Discharge: 2010-11-06 | Disposition: A | Discharge status: Home / Self Care | Payer: BC Managed Care – PPO | Source: Ambulatory Visit | Attending: Internal Medicine | Admitting: Internal Medicine

## 2010-11-06 DIAGNOSIS — Z1231 Encounter for screening mammogram for malignant neoplasm of breast: Secondary | ICD-10-CM

## 2010-11-06 IMAGING — MG MM DIGITAL SCREENING BILAT
4 series · 4 of 4 positions shown · non-contrast
Comparison: none

DG SCREEN MAMMOGRAM BILATERAL
Bilateral CC and MLO view(s) were taken.

DIGITAL SCREENING MAMMOGRAM WITH CAD:
The breast tissue is heterogeneously dense.  No masses or malignant type calcifications are 
identified.  Compared with prior studies.
Images were processed with CAD.

[R CC]
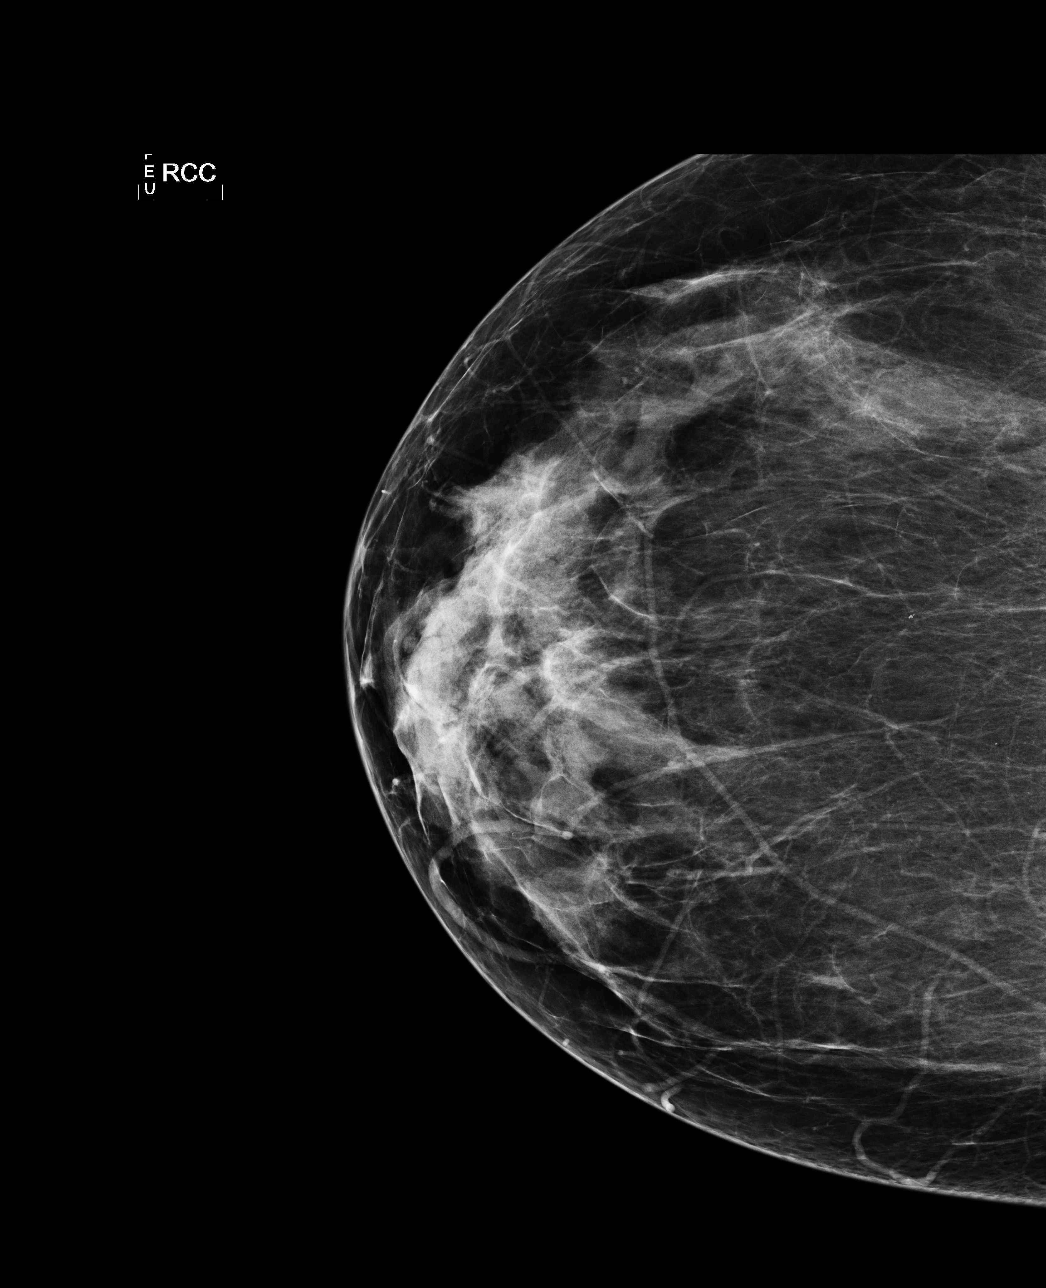

[L CC]
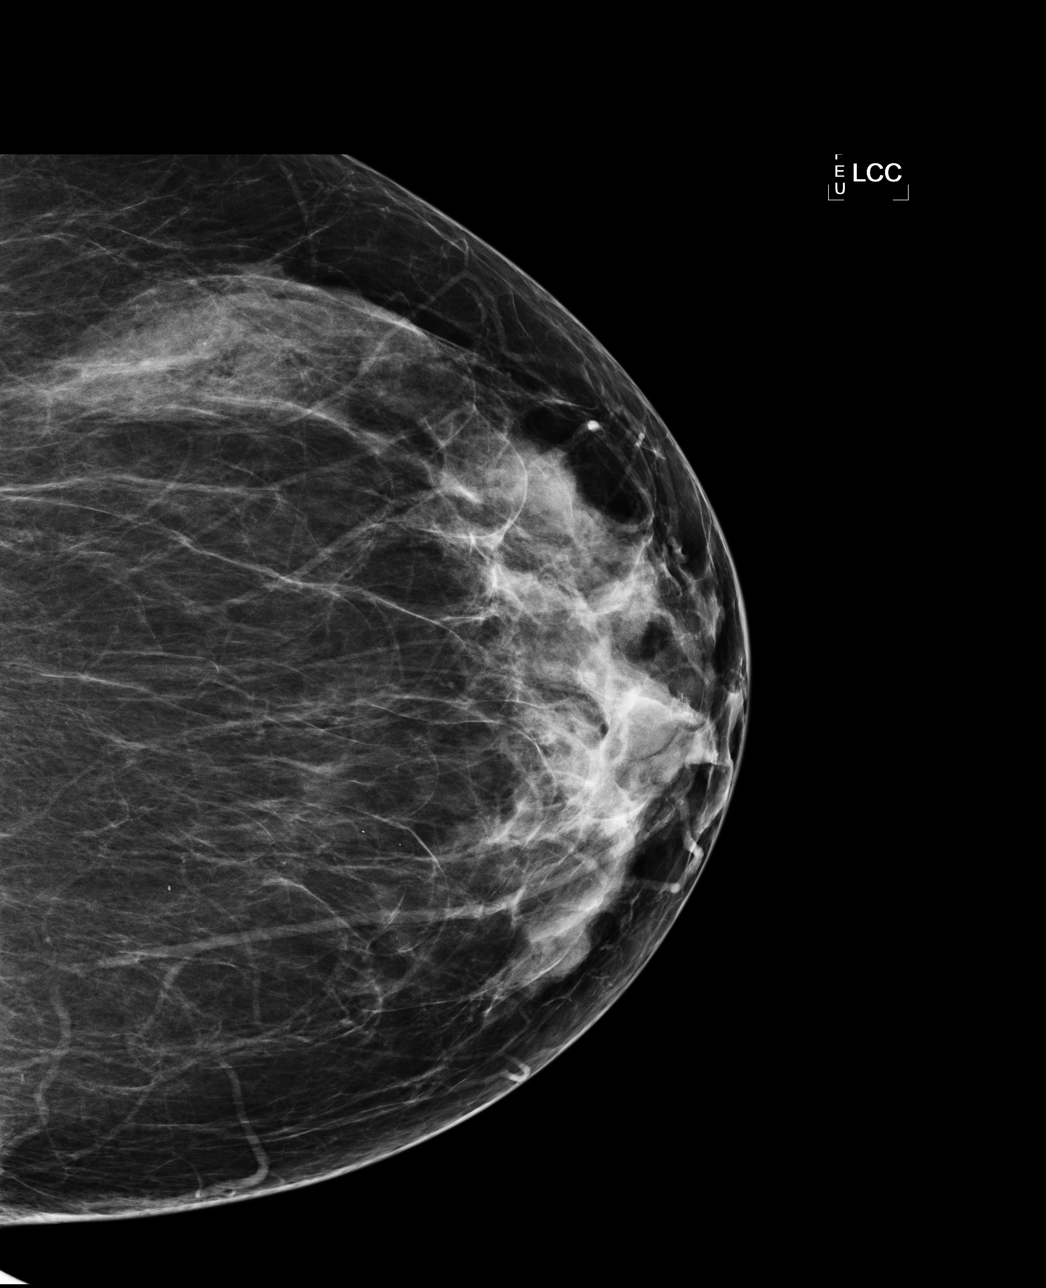

[L MLO]
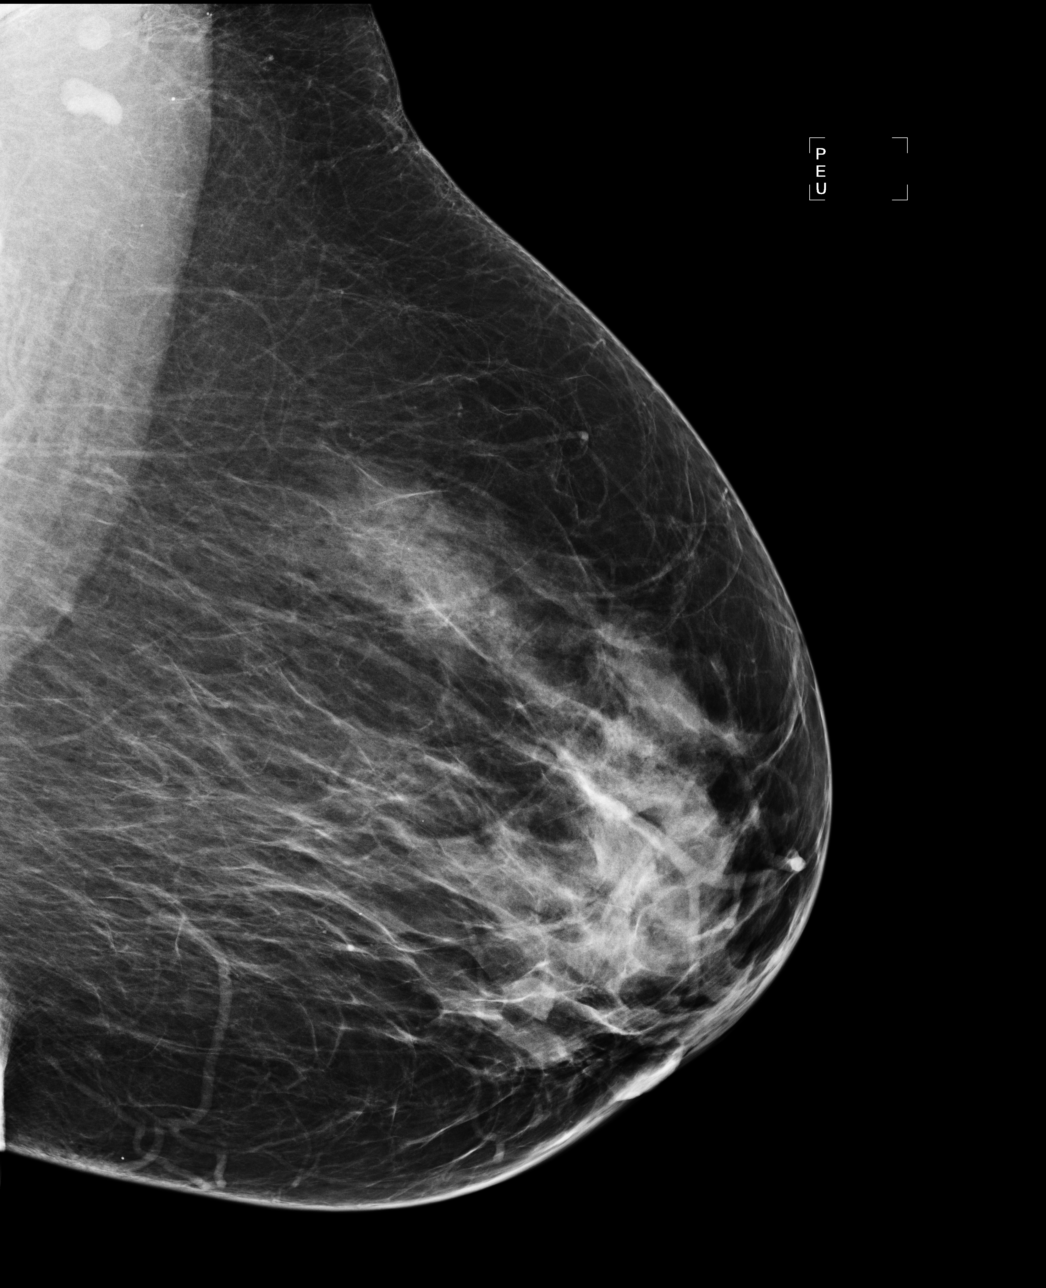

[R MLO]
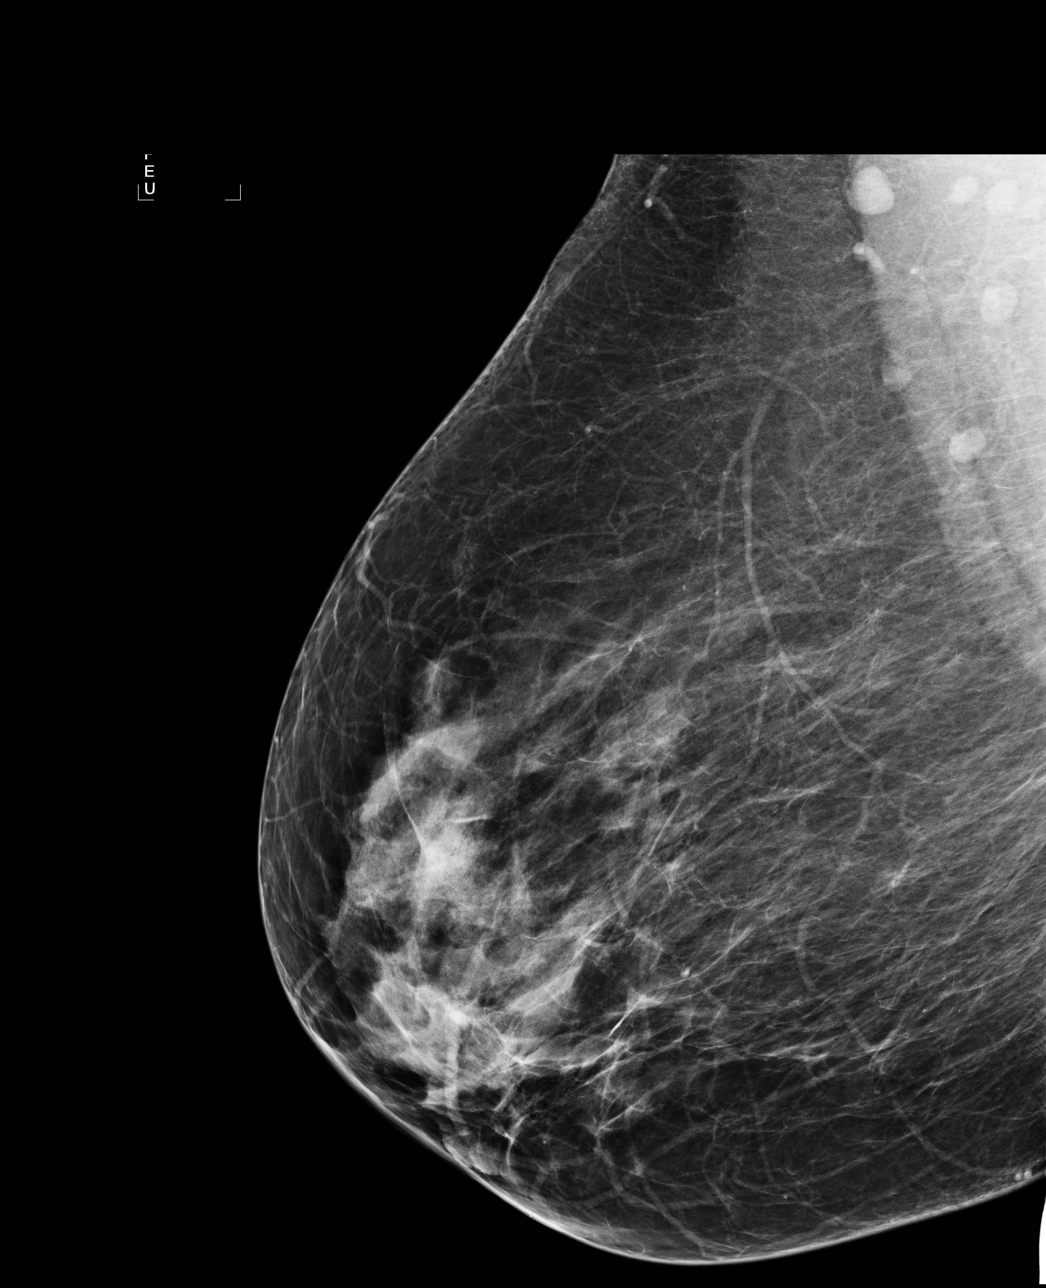

[4 of 4 positions shown; findings below may reference images not displayed]

IMPRESSION: No specific mammographic evidence of malignancy.  Next screening mammogram is recommended in one 
year.

A result letter of this screening mammogram will be mailed directly to the patient.

ASSESSMENT: Negative - BI-RADS 1

Screening mammogram in 1 year.
,

## 2012-06-23 ENCOUNTER — Encounter (HOSPITAL_COMMUNITY): Payer: Self-pay

## 2012-06-23 ENCOUNTER — Emergency Department (HOSPITAL_COMMUNITY): Payer: BC Managed Care – PPO

## 2012-06-23 ENCOUNTER — Emergency Department (HOSPITAL_COMMUNITY)
Admission: EM | Admit: 2012-06-23 | Discharge: 2012-06-23 | Disposition: A | Discharge status: Home / Self Care | Payer: BC Managed Care – PPO | Attending: Emergency Medicine | Admitting: Emergency Medicine

## 2012-06-23 DIAGNOSIS — Y92410 Unspecified street and highway as the place of occurrence of the external cause: Secondary | ICD-10-CM | POA: Insufficient documentation

## 2012-06-23 DIAGNOSIS — S335XXA Sprain of ligaments of lumbar spine, initial encounter: Secondary | ICD-10-CM | POA: Insufficient documentation

## 2012-06-23 DIAGNOSIS — S39012A Strain of muscle, fascia and tendon of lower back, initial encounter: Secondary | ICD-10-CM

## 2012-06-23 DIAGNOSIS — S233XXA Sprain of ligaments of thoracic spine, initial encounter: Secondary | ICD-10-CM

## 2012-06-23 DIAGNOSIS — Z7982 Long term (current) use of aspirin: Secondary | ICD-10-CM | POA: Insufficient documentation

## 2012-06-23 DIAGNOSIS — S239XXA Sprain of unspecified parts of thorax, initial encounter: Secondary | ICD-10-CM | POA: Insufficient documentation

## 2012-06-23 DIAGNOSIS — M25562 Pain in left knee: Secondary | ICD-10-CM

## 2012-06-23 DIAGNOSIS — Y9389 Activity, other specified: Secondary | ICD-10-CM | POA: Insufficient documentation

## 2012-06-23 DIAGNOSIS — Z79899 Other long term (current) drug therapy: Secondary | ICD-10-CM | POA: Insufficient documentation

## 2012-06-23 DIAGNOSIS — E119 Type 2 diabetes mellitus without complications: Secondary | ICD-10-CM | POA: Insufficient documentation

## 2012-06-23 DIAGNOSIS — S79919A Unspecified injury of unspecified hip, initial encounter: Secondary | ICD-10-CM | POA: Insufficient documentation
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
## 2012-06-23 MED ORDER — HYDROCODONE-ACETAMINOPHEN 5-325 MG PO TABS
1.0000 | ORAL_TABLET | Freq: Once | ORAL | Status: AC
  Administered 2012-06-23: 1 via ORAL
  Filled 2012-06-23: qty 1

## 2012-06-23 MED ORDER — HYDROCODONE-ACETAMINOPHEN 5-325 MG PO TABS: 1.0000 | ORAL_TABLET | Freq: Four times a day (QID) | ORAL | Status: DC | PRN
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
## 2012-06-23 NOTE — ED Notes (Signed)
MD at bedside.  EDPA Thayer Ohm present upon arrival to room.  LSB removed by EDPA. c- collar removed by EDPA Thayer Ohm- Pt c/o of only left knee pain and pain behind shoulder blades and lower back pain

## 2012-06-23 NOTE — ED Provider Notes (Signed)
Medical screening examination/treatment/procedure(s) were conducted as a shared visit with non-physician practitioner(s) and myself.  I personally evaluated the patient during the encounter.  Left knee xray reviewed.  Pt will f/u with orthopedic dr.   No head or neck trauma  Donnetta Hutching, MD 06/23/12 1944
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
## 2012-06-29 ENCOUNTER — Telehealth (HOSPITAL_COMMUNITY): Payer: Self-pay | Admitting: Emergency Medicine

## 2012-07-02 ENCOUNTER — Other Ambulatory Visit (HOSPITAL_COMMUNITY): Payer: Self-pay | Admitting: Orthopedic Surgery

## 2012-07-02 DIAGNOSIS — M25562 Pain in left knee: Secondary | ICD-10-CM

## 2012-07-07 ENCOUNTER — Encounter (HOSPITAL_COMMUNITY)
Admission: RE | Admit: 2012-07-07 | Discharge: 2012-07-07 | Disposition: A | Discharge status: Home / Self Care | Payer: BC Managed Care – PPO | Source: Ambulatory Visit | Attending: Orthopedic Surgery | Admitting: Orthopedic Surgery

## 2012-07-07 ENCOUNTER — Ambulatory Visit (HOSPITAL_COMMUNITY)
Admission: RE | Admit: 2012-07-07 | Discharge: 2012-07-07 | Disposition: A | Discharge status: Home / Self Care | Payer: BC Managed Care – PPO | Source: Ambulatory Visit | Attending: Orthopedic Surgery | Admitting: Orthopedic Surgery

## 2012-07-07 DIAGNOSIS — M25569 Pain in unspecified knee: Secondary | ICD-10-CM | POA: Insufficient documentation

## 2012-07-07 DIAGNOSIS — Z96659 Presence of unspecified artificial knee joint: Secondary | ICD-10-CM | POA: Insufficient documentation

## 2012-07-07 DIAGNOSIS — IMO0002 Reserved for concepts with insufficient information to code with codable children: Secondary | ICD-10-CM | POA: Insufficient documentation

## 2012-07-07 DIAGNOSIS — M25562 Pain in left knee: Secondary | ICD-10-CM

## 2012-07-07 DIAGNOSIS — M1710 Unilateral primary osteoarthritis, unspecified knee: Secondary | ICD-10-CM | POA: Insufficient documentation
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
## 2012-07-07 MED ORDER — TECHNETIUM TC 99M MEDRONATE IV KIT
25.0000 | PACK | Freq: Once | INTRAVENOUS | Status: AC | PRN
  Administered 2012-07-07: 25 via INTRAVENOUS

## 2012-07-16 ENCOUNTER — Other Ambulatory Visit: Payer: Self-pay | Admitting: Orthopedic Surgery

## 2012-07-28 ENCOUNTER — Ambulatory Visit (HOSPITAL_COMMUNITY)
Admission: RE | Admit: 2012-07-28 | Discharge: 2012-07-28 | Disposition: A | Discharge status: Home / Self Care | Payer: BC Managed Care – PPO | Source: Ambulatory Visit | Attending: Orthopedic Surgery | Admitting: Orthopedic Surgery

## 2012-07-28 ENCOUNTER — Encounter (HOSPITAL_COMMUNITY): Payer: Self-pay

## 2012-07-28 ENCOUNTER — Encounter (HOSPITAL_COMMUNITY): Payer: Self-pay | Admitting: Pharmacy Technician

## 2012-07-28 ENCOUNTER — Other Ambulatory Visit (HOSPITAL_COMMUNITY): Payer: BC Managed Care – PPO

## 2012-07-28 ENCOUNTER — Encounter (HOSPITAL_COMMUNITY)
Admission: RE | Admit: 2012-07-28 | Discharge: 2012-07-28 | Disposition: A | Discharge status: Home / Self Care | Payer: BC Managed Care – PPO | Source: Ambulatory Visit | Attending: Orthopedic Surgery | Admitting: Orthopedic Surgery

## 2012-07-28 DIAGNOSIS — Z01818 Encounter for other preprocedural examination: Secondary | ICD-10-CM | POA: Insufficient documentation

## 2012-07-28 DIAGNOSIS — Z01812 Encounter for preprocedural laboratory examination: Secondary | ICD-10-CM | POA: Insufficient documentation

## 2012-07-28 DIAGNOSIS — Z01810 Encounter for preprocedural cardiovascular examination: Secondary | ICD-10-CM | POA: Insufficient documentation

## 2012-07-28 HISTORY — DX: Anemia, unspecified: D64.9

## 2012-07-28 HISTORY — DX: Cardiac arrhythmia, unspecified: I49.9

## 2012-07-28 HISTORY — DX: Personal history of other medical treatment: Z92.89

## 2012-07-28 HISTORY — DX: Hyperlipidemia, unspecified: E78.5

## 2012-07-28 HISTORY — DX: Unspecified osteoarthritis, unspecified site: M19.90

## 2012-07-28 LAB — BASIC METABOLIC PANEL
BUN: 24 mg/dL — ABNORMAL HIGH (ref 6–23)
CO2: 29 mEq/L (ref 19–32)
Chloride: 101 mEq/L (ref 96–112)
Glucose, Bld: 114 mg/dL — ABNORMAL HIGH (ref 70–99)
Potassium: 4.3 mEq/L (ref 3.5–5.1)
Sodium: 137 mEq/L (ref 135–145)

## 2012-07-28 LAB — URINALYSIS, ROUTINE W REFLEX MICROSCOPIC
Bilirubin Urine: NEGATIVE
Hgb urine dipstick: NEGATIVE
Ketones, ur: NEGATIVE mg/dL
Specific Gravity, Urine: 1.025 (ref 1.005–1.030)
Urobilinogen, UA: 0.2 mg/dL (ref 0.0–1.0)

## 2012-07-28 LAB — SURGICAL PCR SCREEN
MRSA, PCR: NEGATIVE
Staphylococcus aureus: NEGATIVE

## 2012-07-28 LAB — CBC
HCT: 33.5 % — ABNORMAL LOW (ref 36.0–46.0)
Hemoglobin: 10.6 g/dL — ABNORMAL LOW (ref 12.0–15.0)
MCH: 25.8 pg — ABNORMAL LOW (ref 26.0–34.0)
MCHC: 31.6 g/dL (ref 30.0–36.0)
MCV: 81.5 fL (ref 78.0–100.0)
RBC: 4.11 MIL/uL (ref 3.87–5.11)

## 2012-07-28 IMAGING — CR DG CHEST 2V
2 series · 2 of 2 positions shown · non-contrast
Comparison: None available

CLINICAL DATA: Preoperative evaluation for knee revision, history
diabetes, hyperlipidemia

CHEST - 2 VIEW

[w chest pa]
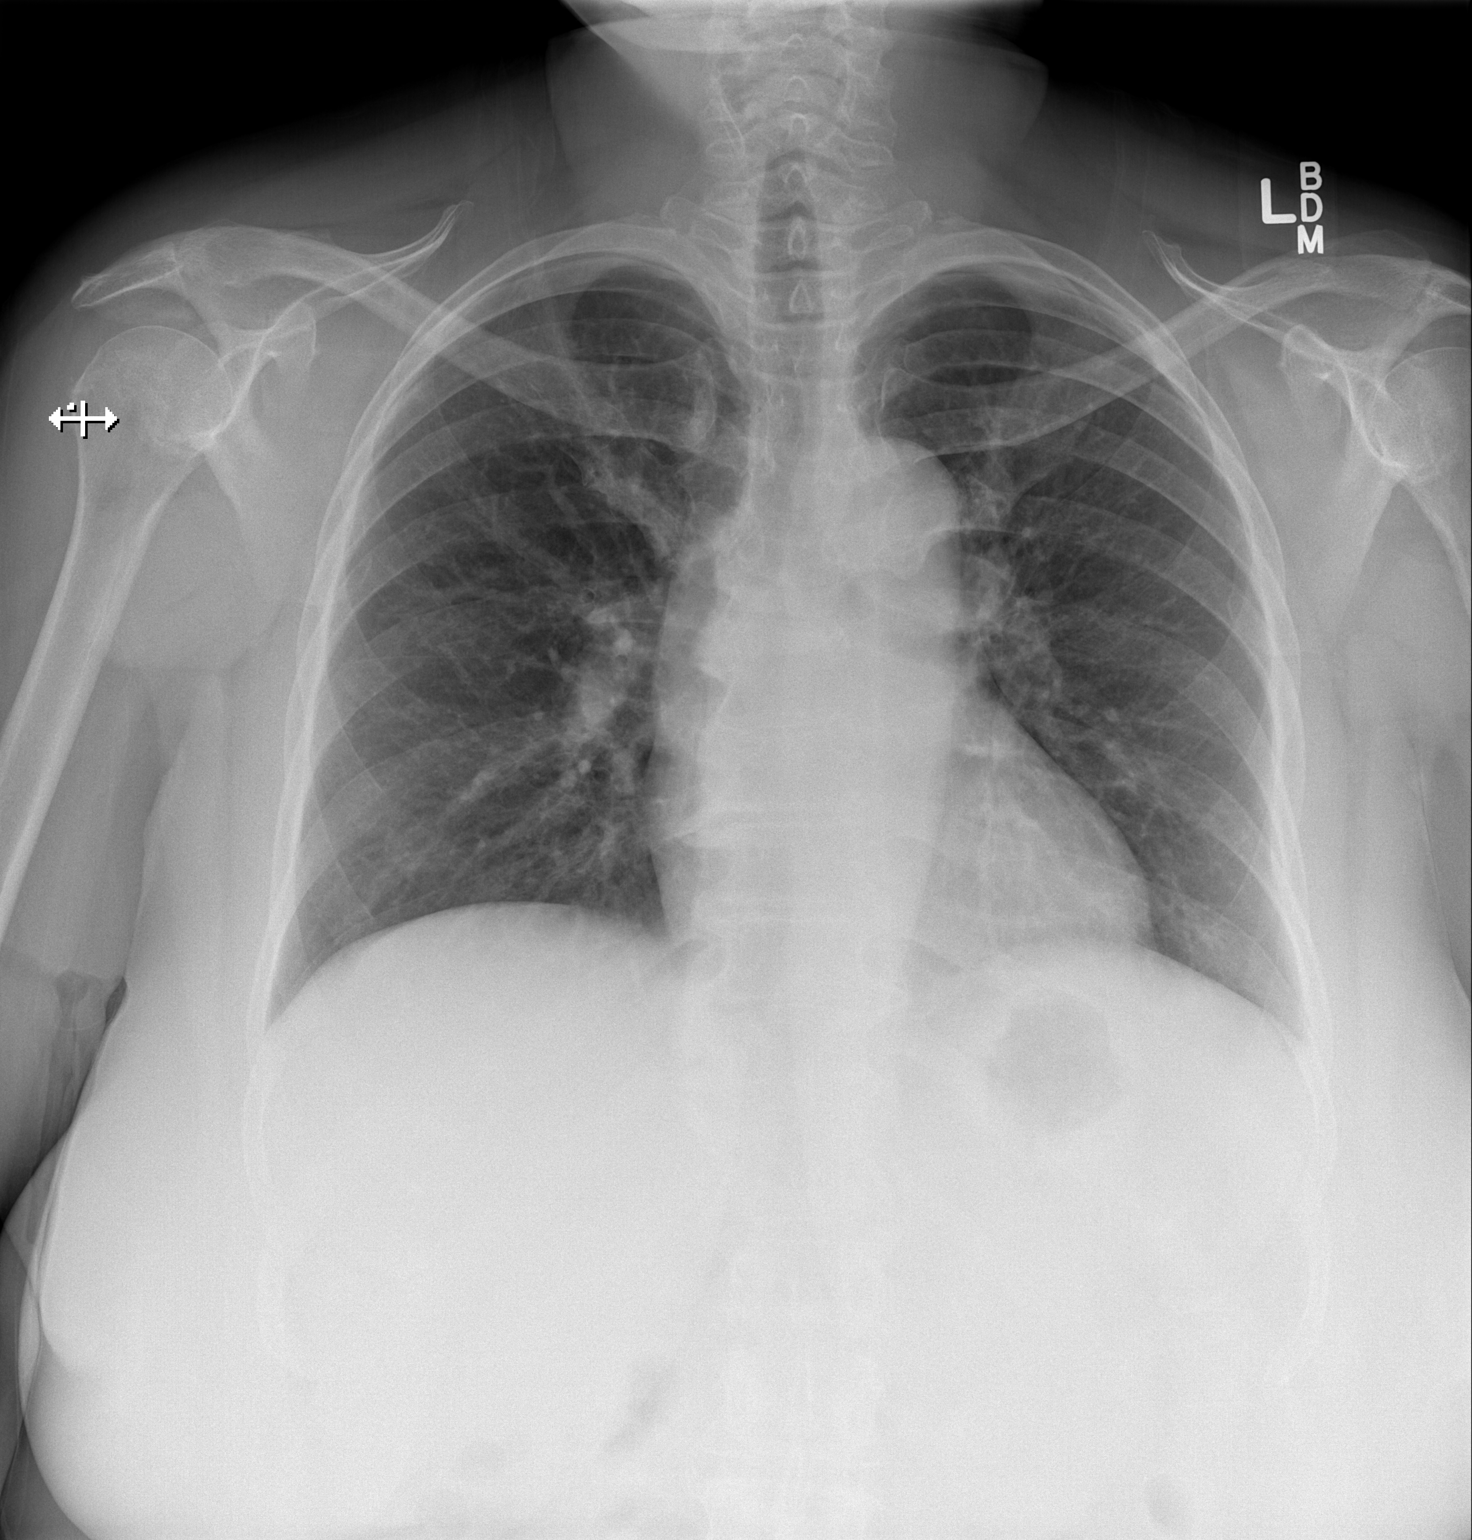

[w chest lat]
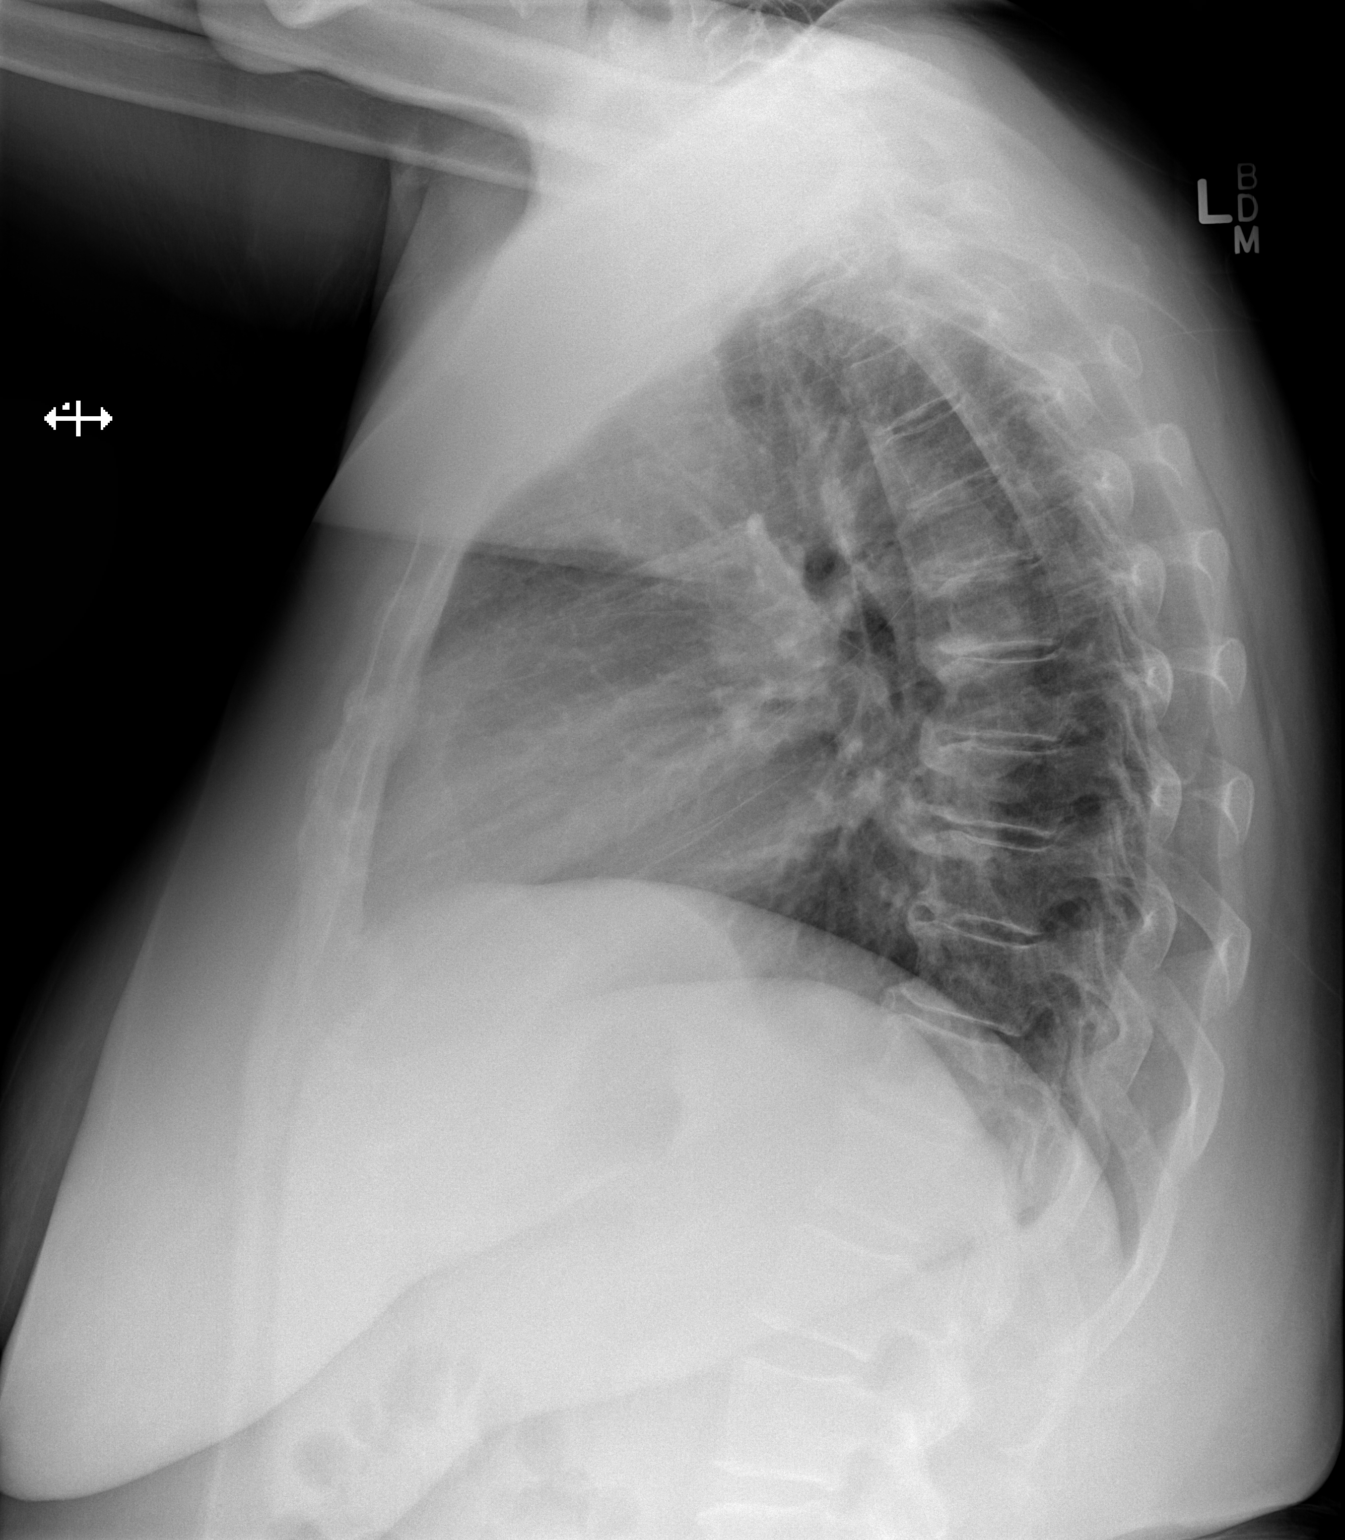

[2 of 2 positions shown; findings below may reference images not displayed]

FINDINGS: Upper-normal size of cardiac silhouette.
Minimal elongation thoracic aorta.
Pulmonary vascularity normal.
Peribronchial thickening without infiltrate, pleural effusion or
pneumothorax.
Scattered endplate spur formation thoracic spine.
IMPRESSION: Mild bronchitic changes.

## 2012-07-28 NOTE — Pre-Procedure Instructions (Addendum)
EVVIE BEHRMANN  07/28/2012   Your procedure is scheduled on:  Tuesday, July 15th  Report to [facility] at 8:30AM.  Call this number if you have problems the morning of surgery: [phone]   Remember:   Do not eat food or drink liquids after midnight.   Take these medicines the morning of surgery with A SIP OF WATER: May take pain medication of needed.   Do not wear jewelry, make-up or nail polish.  Do not wear lotions, powders, or perfumes. You may wear deodorant.  Do not shave 48 hours prior to surgery.  Do not bring valuables to the hospital.  [facility] is not responsible for any belongings or valuables.  Contacts, dentures or bridgework may not be worn into surgery.  Leave suitcase in the car. After surgery it may be brought to your room.  For patients admitted to the hospital, checkout time is 11:00 AM the day of discharge.    Special Instructions: Shower using CHG 2 nights before surgery and the night before surgery.  If you shower the day of surgery use CHG.  Use special wash - you have one bottle of CHG for all showers.  You should use approximately 1/3 of the bottle for each shower.   Please read over the following fact sheets that you were given: Pain Booklet, Coughing and Deep Breathing, Blood Transfusion Information and Surgical Site Infection Prevention

## 2012-07-29 NOTE — Progress Notes (Signed)
Anesthesia chart review: Patient is a 57 year old female scheduled for left total knee revision on 08/04/2012 by Dr. August Saucer. History includes obesity, former smoker, chronic back pain, anemia, arthritis, diabetes mellitus type 2, hyperlipidemia, tachycardia, umbilical hernia repair.  PCP is listed as Dr. Andi Devon.  EKG on 07/28/12 showed NSR, cannot rule out anterior infarct (age undetermined). Movement in V3.  Overall, the interpreting cardiologist did not feel that it was significantly change since her prior EKG on 12/09/06 (see Muse).     CXR on 07/28/12 showed mild bronchitic changes.  Preoperative labs noted.  H/H 10.6/33.5 which appears stable when compared to labs in 2008 and 2011 in Epic.  A T&S has already been done.  Urine culture is still pending.  PT/PTT were not done at PAT, so will need to be done on the day of surgery.  No CV symptoms were documented at his PAT visit.  If no acute changes and same day labs are acceptable then I would anticipate that she could proceed as planned.  Velna Ochs [facility]/Anesthesiology Phone [phone] 07/29/2012 11:30 AM

## 2012-07-30 LAB — URINE CULTURE
Colony Count: NO GROWTH
Culture: NO GROWTH

## 2012-08-03 MED ORDER — CEFAZOLIN SODIUM-DEXTROSE 2-3 GM-% IV SOLR
2.0000 g | INTRAVENOUS | Status: AC
  Administered 2012-08-04 (×2): 2 g via INTRAVENOUS
  Filled 2012-08-03: qty 50

## 2012-08-03 MED ORDER — CHLORHEXIDINE GLUCONATE 4 % EX LIQD: 60.0000 mL | Freq: Once | CUTANEOUS | Status: DC

## 2012-08-03 NOTE — Progress Notes (Signed)
Patient returned call and stated that she would be here at 0530 am tomorrow.

## 2012-08-03 NOTE — Progress Notes (Signed)
Message left for patient to arrive at 0530 for surgery at 0730. Asked patient to call us back at [phone] to verify that she received the message.

## 2012-08-04 ENCOUNTER — Inpatient Hospital Stay (HOSPITAL_COMMUNITY)
Admission: RE | Admit: 2012-08-04 | Discharge: 2012-08-07 | DRG: 471 | Disposition: A | Discharge status: Home Health | Payer: BC Managed Care – PPO | Source: Ambulatory Visit | Attending: Orthopedic Surgery | Admitting: Orthopedic Surgery

## 2012-08-04 ENCOUNTER — Encounter (HOSPITAL_COMMUNITY): Payer: Self-pay | Admitting: Vascular Surgery

## 2012-08-04 ENCOUNTER — Encounter (HOSPITAL_COMMUNITY): Payer: Self-pay | Admitting: Anesthesiology

## 2012-08-04 ENCOUNTER — Inpatient Hospital Stay (HOSPITAL_COMMUNITY): Payer: BC Managed Care – PPO | Admitting: Anesthesiology

## 2012-08-04 ENCOUNTER — Encounter (HOSPITAL_COMMUNITY)
Admission: RE | Disposition: A | Discharge status: Home Health | Payer: Self-pay | Source: Ambulatory Visit | Attending: Orthopedic Surgery

## 2012-08-04 DIAGNOSIS — M1712 Unilateral primary osteoarthritis, left knee: Secondary | ICD-10-CM

## 2012-08-04 DIAGNOSIS — Z96659 Presence of unspecified artificial knee joint: Secondary | ICD-10-CM

## 2012-08-04 DIAGNOSIS — R110 Nausea: Secondary | ICD-10-CM | POA: Diagnosis not present

## 2012-08-04 DIAGNOSIS — Z283 Underimmunization status: Secondary | ICD-10-CM

## 2012-08-04 DIAGNOSIS — Y831 Surgical operation with implant of artificial internal device as the cause of abnormal reaction of the patient, or of later complication, without mention of misadventure at the time of the procedure: Secondary | ICD-10-CM | POA: Diagnosis present

## 2012-08-04 DIAGNOSIS — Z79899 Other long term (current) drug therapy: Secondary | ICD-10-CM

## 2012-08-04 DIAGNOSIS — Z2839 Other underimmunization status: Secondary | ICD-10-CM

## 2012-08-04 DIAGNOSIS — M549 Dorsalgia, unspecified: Secondary | ICD-10-CM | POA: Diagnosis present

## 2012-08-04 DIAGNOSIS — G8929 Other chronic pain: Secondary | ICD-10-CM | POA: Diagnosis present

## 2012-08-04 DIAGNOSIS — M129 Arthropathy, unspecified: Secondary | ICD-10-CM | POA: Diagnosis present

## 2012-08-04 DIAGNOSIS — T84039A Mechanical loosening of unspecified internal prosthetic joint, initial encounter: Principal | ICD-10-CM | POA: Diagnosis present

## 2012-08-04 HISTORY — PX: TOTAL KNEE REVISION: SHX996

## 2012-08-04 HISTORY — DX: Type 2 diabetes mellitus without complications: E11.9

## 2012-08-04 HISTORY — DX: Other chronic pain: G89.29

## 2012-08-04 HISTORY — DX: Low back pain: M54.5

## 2012-08-04 HISTORY — DX: Low back pain, unspecified: M54.50

## 2012-08-04 LAB — GLUCOSE, CAPILLARY
Glucose-Capillary: 252 mg/dL — ABNORMAL HIGH (ref 70–99)
Glucose-Capillary: 301 mg/dL — ABNORMAL HIGH (ref 70–99)

## 2012-08-04 LAB — APTT: aPTT: 30 seconds (ref 24–37)

## 2012-08-04 SURGERY — TOTAL KNEE REVISION
Anesthesia: General | Site: Knee | Laterality: Left | Wound class: Dirty or Infected

## 2012-08-04 MED ORDER — INSULIN ASPART 100 UNIT/ML ~~LOC~~ SOLN
0.0000 [IU] | Freq: Three times a day (TID) | SUBCUTANEOUS | Status: DC
  Administered 2012-08-04: 11 [IU] via SUBCUTANEOUS
  Administered 2012-08-05: 2 [IU] via SUBCUTANEOUS
  Administered 2012-08-05: 3 [IU] via SUBCUTANEOUS
  Administered 2012-08-05: 8 [IU] via SUBCUTANEOUS
  Administered 2012-08-06 – 2012-08-07 (×4): 3 [IU] via SUBCUTANEOUS
  Administered 2012-08-07: 2 [IU] via SUBCUTANEOUS

## 2012-08-04 MED ORDER — BUPIVACAINE LIPOSOME 1.3 % IJ SUSP
INTRAMUSCULAR | Status: DC | PRN
  Administered 2012-08-04: 10:00:00

## 2012-08-04 MED ORDER — SIMVASTATIN 40 MG PO TABS
40.0000 mg | ORAL_TABLET | Freq: Every day | ORAL | Status: DC
  Administered 2012-08-04 – 2012-08-06 (×3): 40 mg via ORAL
  Filled 2012-08-04 (×4): qty 1

## 2012-08-04 MED ORDER — METOCLOPRAMIDE HCL 10 MG PO TABS: 5.0000 mg | ORAL_TABLET | Freq: Three times a day (TID) | ORAL | Status: DC | PRN

## 2012-08-04 MED ORDER — CEFAZOLIN SODIUM-DEXTROSE 2-3 GM-% IV SOLR
2.0000 g | Freq: Three times a day (TID) | INTRAVENOUS | Status: AC
  Administered 2012-08-04 (×2): 2 g via INTRAVENOUS
  Filled 2012-08-04 (×2): qty 50

## 2012-08-04 MED ORDER — ONDANSETRON HCL 4 MG/2ML IJ SOLN: 4.0000 mg | Freq: Once | INTRAMUSCULAR | Status: DC | PRN

## 2012-08-04 MED ORDER — CEFAZOLIN SODIUM 1-5 GM-% IV SOLN
INTRAVENOUS | Status: AC
  Filled 2012-08-04: qty 100

## 2012-08-04 MED ORDER — 0.9 % SODIUM CHLORIDE (POUR BTL) OPTIME
TOPICAL | Status: DC | PRN
  Administered 2012-08-04 (×2): 3000 mL
  Administered 2012-08-04 (×4): 1000 mL

## 2012-08-04 MED ORDER — WARFARIN VIDEO: Freq: Once | Status: DC

## 2012-08-04 MED ORDER — ASPIRIN EC 81 MG PO TBEC
81.0000 mg | DELAYED_RELEASE_TABLET | Freq: Every day | ORAL | Status: DC
  Administered 2012-08-04 – 2012-08-07 (×4): 81 mg via ORAL
  Filled 2012-08-04 (×4): qty 1

## 2012-08-04 MED ORDER — METHOCARBAMOL 500 MG PO TABS
ORAL_TABLET | ORAL | Status: AC
  Filled 2012-08-04: qty 1

## 2012-08-04 MED ORDER — HYDROMORPHONE 0.3 MG/ML IV SOLN
INTRAVENOUS | Status: DC
  Administered 2012-08-04: 1.79 mg via INTRAVENOUS
  Administered 2012-08-04: 0.799 mg via INTRAVENOUS
  Administered 2012-08-05: 0.599 mg via INTRAVENOUS
  Administered 2012-08-05: 1.59 mg via INTRAVENOUS

## 2012-08-04 MED ORDER — HYDROMORPHONE HCL PF 1 MG/ML IJ SOLN
INTRAMUSCULAR | Status: AC
  Administered 2012-08-04: 0.5 mg via INTRAVENOUS
  Filled 2012-08-04: qty 2

## 2012-08-04 MED ORDER — ARTIFICIAL TEARS OP OINT
TOPICAL_OINTMENT | OPHTHALMIC | Status: DC | PRN
  Administered 2012-08-04: 1 via OPHTHALMIC

## 2012-08-04 MED ORDER — FENTANYL CITRATE 0.05 MG/ML IJ SOLN
INTRAMUSCULAR | Status: DC | PRN
  Administered 2012-08-04 (×7): 50 ug via INTRAVENOUS
  Administered 2012-08-04: 150 ug via INTRAVENOUS
  Administered 2012-08-04 (×5): 50 ug via INTRAVENOUS

## 2012-08-04 MED ORDER — PHENYLEPHRINE HCL 10 MG/ML IJ SOLN
INTRAMUSCULAR | Status: DC | PRN
  Administered 2012-08-04 (×4): 40 ug via INTRAVENOUS

## 2012-08-04 MED ORDER — GLIMEPIRIDE 4 MG PO TABS
6.0000 mg | ORAL_TABLET | Freq: Every day | ORAL | Status: DC
  Administered 2012-08-05 – 2012-08-07 (×3): 6 mg via ORAL
  Filled 2012-08-04 (×4): qty 1

## 2012-08-04 MED ORDER — NEOSTIGMINE METHYLSULFATE 1 MG/ML IJ SOLN
INTRAMUSCULAR | Status: DC | PRN
  Administered 2012-08-04: 3 mg via INTRAVENOUS

## 2012-08-04 MED ORDER — LIDOCAINE HCL (CARDIAC) 20 MG/ML IV SOLN
INTRAVENOUS | Status: DC | PRN
  Administered 2012-08-04: 80 mg via INTRAVENOUS

## 2012-08-04 MED ORDER — METFORMIN HCL ER 500 MG PO TB24
1000.0000 mg | ORAL_TABLET | Freq: Two times a day (BID) | ORAL | Status: DC
  Filled 2012-08-04: qty 2

## 2012-08-04 MED ORDER — HYDROMORPHONE HCL PF 1 MG/ML IJ SOLN
0.2500 mg | INTRAMUSCULAR | Status: DC | PRN
  Administered 2012-08-04 (×3): 0.5 mg via INTRAVENOUS

## 2012-08-04 MED ORDER — ONDANSETRON HCL 4 MG/2ML IJ SOLN: 4.0000 mg | Freq: Four times a day (QID) | INTRAMUSCULAR | Status: DC | PRN

## 2012-08-04 MED ORDER — EPHEDRINE SULFATE 50 MG/ML IJ SOLN
INTRAMUSCULAR | Status: DC | PRN
  Administered 2012-08-04 (×4): 5 mg via INTRAVENOUS

## 2012-08-04 MED ORDER — OXYCODONE HCL 5 MG PO TABS: 5.0000 mg | ORAL_TABLET | Freq: Once | ORAL | Status: DC | PRN

## 2012-08-04 MED ORDER — BUPIVACAINE HCL (PF) 0.25 % IJ SOLN
INTRAMUSCULAR | Status: DC | PRN
  Administered 2012-08-04: 30 mL

## 2012-08-04 MED ORDER — BUPIVACAINE LIPOSOME 1.3 % IJ SUSP
20.0000 mL | Freq: Once | INTRAMUSCULAR | Status: DC
  Filled 2012-08-04: qty 20

## 2012-08-04 MED ORDER — COUMADIN BOOK
Freq: Once | Status: AC
  Administered 2012-08-04: 18:00:00
  Filled 2012-08-04: qty 1

## 2012-08-04 MED ORDER — DIPHENHYDRAMINE HCL 12.5 MG/5ML PO ELIX: 12.5000 mg | ORAL_SOLUTION | Freq: Four times a day (QID) | ORAL | Status: DC | PRN

## 2012-08-04 MED ORDER — LABETALOL HCL 5 MG/ML IV SOLN
INTRAVENOUS | Status: DC | PRN
  Administered 2012-08-04 (×2): 5 mg via INTRAVENOUS

## 2012-08-04 MED ORDER — NALOXONE HCL 0.4 MG/ML IJ SOLN: 0.4000 mg | INTRAMUSCULAR | Status: DC | PRN

## 2012-08-04 MED ORDER — CEFUROXIME SODIUM 1.5 G IJ SOLR
INTRAMUSCULAR | Status: DC | PRN
  Administered 2012-08-04: 1.5 g

## 2012-08-04 MED ORDER — OXYCODONE HCL 5 MG/5ML PO SOLN: 5.0000 mg | Freq: Once | ORAL | Status: DC | PRN

## 2012-08-04 MED ORDER — ROCURONIUM BROMIDE 100 MG/10ML IV SOLN
INTRAVENOUS | Status: DC | PRN
  Administered 2012-08-04: 50 mg via INTRAVENOUS

## 2012-08-04 MED ORDER — METOCLOPRAMIDE HCL 5 MG/ML IJ SOLN: 5.0000 mg | Freq: Three times a day (TID) | INTRAMUSCULAR | Status: DC | PRN

## 2012-08-04 MED ORDER — INSULIN ASPART 100 UNIT/ML ~~LOC~~ SOLN: 0.0000 [IU] | Freq: Three times a day (TID) | SUBCUTANEOUS | Status: DC

## 2012-08-04 MED ORDER — MEPERIDINE HCL 25 MG/ML IJ SOLN: 6.2500 mg | INTRAMUSCULAR | Status: DC | PRN

## 2012-08-04 MED ORDER — DOCUSATE SODIUM 100 MG PO CAPS
100.0000 mg | ORAL_CAPSULE | Freq: Two times a day (BID) | ORAL | Status: DC
  Administered 2012-08-04 – 2012-08-07 (×6): 100 mg via ORAL
  Filled 2012-08-04 (×7): qty 1

## 2012-08-04 MED ORDER — HYDROMORPHONE 0.3 MG/ML IV SOLN
INTRAVENOUS | Status: AC
  Filled 2012-08-04: qty 25

## 2012-08-04 MED ORDER — CEFUROXIME SODIUM 1.5 G IJ SOLR
INTRAMUSCULAR | Status: AC
  Filled 2012-08-04: qty 1.5

## 2012-08-04 MED ORDER — PHENOL 1.4 % MT LIQD: 1.0000 | OROMUCOSAL | Status: DC | PRN

## 2012-08-04 MED ORDER — POTASSIUM CHLORIDE IN NACL 20-0.9 MEQ/L-% IV SOLN
INTRAVENOUS | Status: DC
  Administered 2012-08-04 – 2012-08-05 (×2): via INTRAVENOUS
  Filled 2012-08-04 (×2): qty 1000

## 2012-08-04 MED ORDER — METHOCARBAMOL 500 MG PO TABS
500.0000 mg | ORAL_TABLET | Freq: Three times a day (TID) | ORAL | Status: DC | PRN
  Administered 2012-08-04 – 2012-08-07 (×6): 500 mg via ORAL
  Filled 2012-08-04 (×5): qty 1

## 2012-08-04 MED ORDER — LACTATED RINGERS IV SOLN
INTRAVENOUS | Status: DC | PRN
  Administered 2012-08-04 (×4): via INTRAVENOUS

## 2012-08-04 MED ORDER — SODIUM CHLORIDE 0.9 % IJ SOLN
INTRAMUSCULAR | Status: AC
  Filled 2012-08-04: qty 12

## 2012-08-04 MED ORDER — MIDAZOLAM HCL 5 MG/5ML IJ SOLN
INTRAMUSCULAR | Status: DC | PRN
  Administered 2012-08-04 (×2): 1 mg via INTRAVENOUS

## 2012-08-04 MED ORDER — ONDANSETRON HCL 4 MG PO TABS
4.0000 mg | ORAL_TABLET | Freq: Four times a day (QID) | ORAL | Status: DC | PRN
  Administered 2012-08-07: 4 mg via ORAL
  Filled 2012-08-04: qty 1

## 2012-08-04 MED ORDER — BUPIVACAINE HCL (PF) 0.25 % IJ SOLN
INTRAMUSCULAR | Status: AC
  Filled 2012-08-04: qty 30

## 2012-08-04 MED ORDER — SODIUM CHLORIDE 0.9 % IJ SOLN: 9.0000 mL | INTRAMUSCULAR | Status: DC | PRN

## 2012-08-04 MED ORDER — PROPOFOL 10 MG/ML IV BOLUS
INTRAVENOUS | Status: DC | PRN
  Administered 2012-08-04: 30 mg via INTRAVENOUS
  Administered 2012-08-04 (×2): 50 mg via INTRAVENOUS
  Administered 2012-08-04: 150 mg via INTRAVENOUS
  Administered 2012-08-04: 50 mg via INTRAVENOUS

## 2012-08-04 MED ORDER — WARFARIN - PHARMACIST DOSING INPATIENT
Freq: Every day | Status: DC
  Administered 2012-08-06: 1

## 2012-08-04 MED ORDER — KETOROLAC TROMETHAMINE 30 MG/ML IJ SOLN
INTRAMUSCULAR | Status: DC | PRN
  Administered 2012-08-04: 30 mg via INTRAVENOUS

## 2012-08-04 MED ORDER — ONDANSETRON HCL 4 MG/2ML IJ SOLN
INTRAMUSCULAR | Status: DC | PRN
  Administered 2012-08-04: 4 mg via INTRAVENOUS

## 2012-08-04 MED ORDER — HYDROCODONE-ACETAMINOPHEN 10-325 MG PO TABS
1.0000 | ORAL_TABLET | ORAL | Status: DC | PRN
  Administered 2012-08-05 – 2012-08-07 (×9): 2 via ORAL
  Filled 2012-08-04 (×9): qty 2

## 2012-08-04 MED ORDER — MENTHOL 3 MG MT LOZG: 1.0000 | LOZENGE | OROMUCOSAL | Status: DC | PRN

## 2012-08-04 MED ORDER — METFORMIN HCL ER 500 MG PO TB24
1000.0000 mg | ORAL_TABLET | Freq: Two times a day (BID) | ORAL | Status: DC
  Administered 2012-08-05: 1000 mg via ORAL
  Administered 2012-08-05: 500 mg via ORAL
  Administered 2012-08-06 (×2): 1000 mg via ORAL
  Filled 2012-08-04 (×8): qty 2

## 2012-08-04 MED ORDER — ACETAMINOPHEN 650 MG RE SUPP: 650.0000 mg | Freq: Four times a day (QID) | RECTAL | Status: DC | PRN

## 2012-08-04 MED ORDER — WARFARIN SODIUM 7.5 MG PO TABS
7.5000 mg | ORAL_TABLET | Freq: Once | ORAL | Status: AC
  Administered 2012-08-04: 7.5 mg via ORAL
  Filled 2012-08-04: qty 1

## 2012-08-04 MED ORDER — DIPHENHYDRAMINE HCL 50 MG/ML IJ SOLN: 12.5000 mg | Freq: Four times a day (QID) | INTRAMUSCULAR | Status: DC | PRN

## 2012-08-04 MED ORDER — GLYCOPYRROLATE 0.2 MG/ML IJ SOLN
INTRAMUSCULAR | Status: DC | PRN
  Administered 2012-08-04: .4 mg via INTRAVENOUS
  Administered 2012-08-04: 0.4 mg via INTRAVENOUS

## 2012-08-04 MED ORDER — ACETAMINOPHEN 325 MG PO TABS: 650.0000 mg | ORAL_TABLET | Freq: Four times a day (QID) | ORAL | Status: DC | PRN

## 2012-08-04 MED ORDER — HYDROMORPHONE HCL PF 1 MG/ML IJ SOLN
0.5000 mg | INTRAMUSCULAR | Status: DC | PRN
  Administered 2012-08-07: 0.5 mg via INTRAVENOUS
  Filled 2012-08-04 (×2): qty 1

## 2012-08-04 SURGICAL SUPPLY — 96 items
ADAPTER BOLT FEMORAL +2/-2 (Knees) ×2 IMPLANT
ADPR FEM +2/-2 OFST BOLT (Knees) ×1 IMPLANT
AUG FEM SZ2.5 4STRL LF KN LT (Knees) ×1 IMPLANT
AUGMENT DIST PFC 4MM (Knees) ×1 IMPLANT
BANDAGE ELASTIC 4 VELCRO ST LF (GAUZE/BANDAGES/DRESSINGS) ×2 IMPLANT
BANDAGE ELASTIC 6 VELCRO ST LF (GAUZE/BANDAGES/DRESSINGS) ×2 IMPLANT
BANDAGE ESMARK 6X9 LF (GAUZE/BANDAGES/DRESSINGS) ×1 IMPLANT
BLADE LONG MED 31X9 (MISCELLANEOUS) ×2 IMPLANT
BLADE SAG 18X100X1.27 (BLADE) ×2 IMPLANT
BLADE SAW SGTL 13.0X1.19X90.0M (BLADE) ×2 IMPLANT
BLADE SAW SGTL 81X20 HD (BLADE) ×4 IMPLANT
BLADE SURG 10 STRL SS (BLADE) ×8 IMPLANT
BNDG CMPR 9X6 STRL LF SNTH (GAUZE/BANDAGES/DRESSINGS) ×1
BNDG COHESIVE 6X5 TAN STRL LF (GAUZE/BANDAGES/DRESSINGS) ×4 IMPLANT
BNDG ESMARK 6X9 LF (GAUZE/BANDAGES/DRESSINGS) ×2
BOWL SMART MIX CTS (DISPOSABLE) ×2 IMPLANT
CATH URET WHISTLE 8FR 331008 (CATHETERS) ×2 IMPLANT
CEMENT HV SMART SET (Cement) ×6 IMPLANT
CLOTH BEACON ORANGE TIMEOUT ST (SAFETY) ×2 IMPLANT
COVER BACK TABLE 24X17X13 BIG (DRAPES) IMPLANT
COVER SURGICAL LIGHT HANDLE (MISCELLANEOUS) ×2 IMPLANT
CUFF TOURNIQUET SINGLE 34IN LL (TOURNIQUET CUFF) ×2 IMPLANT
CUFF TOURNIQUET SINGLE 44IN (TOURNIQUET CUFF) IMPLANT
DISAL AUG PFC 4MM (Knees) ×2 IMPLANT
DRAPE INCISE IOBAN 66X45 STRL (DRAPES) ×2 IMPLANT
DRAPE ORTHO SPLIT 77X108 STRL (DRAPES) ×4
DRAPE PROXIMA HALF (DRAPES) ×2 IMPLANT
DRAPE SURG ORHT 6 SPLT 77X108 (DRAPES) ×2 IMPLANT
DRAPE U-SHAPE 47X51 STRL (DRAPES) ×2 IMPLANT
DRAPE X-RAY CASS 24X20 (DRAPES) IMPLANT
DRSG PAD ABDOMINAL 8X10 ST (GAUZE/BANDAGES/DRESSINGS) ×4 IMPLANT
DURAPREP 26ML APPLICATOR (WOUND CARE) ×2 IMPLANT
ELECT REM PT RETURN 9FT ADLT (ELECTROSURGICAL) ×2
ELECTRODE REM PT RTRN 9FT ADLT (ELECTROSURGICAL) ×1 IMPLANT
EVACUATOR 1/8 PVC DRAIN (DRAIN) ×2 IMPLANT
FACESHIELD LNG OPTICON STERILE (SAFETY) ×2 IMPLANT
FEMORAL ADAPTER (Orthopedic Implant) ×2 IMPLANT
FEMORAL PFC TC3 (Orthopedic Implant) ×2 IMPLANT
GAUZE XEROFORM 5X9 LF (GAUZE/BANDAGES/DRESSINGS) ×2 IMPLANT
GLOVE BIO SURGEON ST LM GN SZ9 (GLOVE) ×2 IMPLANT
GLOVE BIOGEL PI IND STRL 7.5 (GLOVE) ×1 IMPLANT
GLOVE BIOGEL PI IND STRL 8 (GLOVE) ×1 IMPLANT
GLOVE BIOGEL PI INDICATOR 7.5 (GLOVE) ×1
GLOVE BIOGEL PI INDICATOR 8 (GLOVE) ×1
GLOVE ECLIPSE 7.0 STRL STRAW (GLOVE) ×2 IMPLANT
GLOVE SURG ORTHO 8.0 STRL STRW (GLOVE) ×2 IMPLANT
GOWN PREVENTION PLUS LG XLONG (DISPOSABLE) IMPLANT
GOWN PREVENTION PLUS XLARGE (GOWN DISPOSABLE) ×4 IMPLANT
GOWN STRL NON-REIN LRG LVL3 (GOWN DISPOSABLE) ×2 IMPLANT
HANDPIECE INTERPULSE COAX TIP (DISPOSABLE) ×1
HOOD PEEL AWAY FACE SHEILD DIS (HOOD) ×6 IMPLANT
IMMOBILIZER KNEE 20 (SOFTGOODS)
IMMOBILIZER KNEE 20 THIGH 36 (SOFTGOODS) IMPLANT
IMMOBILIZER KNEE 22 UNIV (SOFTGOODS) ×2 IMPLANT
IMMOBILIZER KNEE 24 THIGH 36 (MISCELLANEOUS) IMPLANT
IMMOBILIZER KNEE 24 UNIV (MISCELLANEOUS)
INSERT TC3 RP TIBIAL  2.5 22.5 ×2 IMPLANT
KIT BASIN OR (CUSTOM PROCEDURE TRAY) ×2 IMPLANT
KIT ROOM TURNOVER OR (KITS) ×2 IMPLANT
MANIFOLD NEPTUNE II (INSTRUMENTS) ×2 IMPLANT
NEEDLE 22X1 1/2 (OR ONLY) (NEEDLE) ×2 IMPLANT
NEEDLE SPNL 18GX3.5 QUINCKE PK (NEEDLE) ×2 IMPLANT
NS IRRIG 1000ML POUR BTL (IV SOLUTION) ×2 IMPLANT
PACK TOTAL JOINT (CUSTOM PROCEDURE TRAY) ×2 IMPLANT
PAD ARMBOARD 7.5X6 YLW CONV (MISCELLANEOUS) ×4 IMPLANT
PAD CAST 4YDX4 CTTN HI CHSV (CAST SUPPLIES) ×1 IMPLANT
PADDING CAST COTTON 4X4 STRL (CAST SUPPLIES) ×2
PADDING CAST COTTON 6X4 STRL (CAST SUPPLIES) ×2 IMPLANT
POST AUG PFC 8MM SZ 2.5 (Knees) ×2 IMPLANT
POST AUG PFC SIGMA 8MM 2.5 LT (Knees) ×2 IMPLANT
RUBBERBAND STERILE (MISCELLANEOUS) ×2 IMPLANT
SET HNDPC FAN SPRY TIP SCT (DISPOSABLE) ×1 IMPLANT
SLEEVE TIB MBT 27/45 40 (Knees) ×2 IMPLANT
SPONGE GAUZE 4X4 12PLY (GAUZE/BANDAGES/DRESSINGS) ×2 IMPLANT
SPONGE LAP 18X18 X RAY DECT (DISPOSABLE) IMPLANT
STAPLER VISISTAT 35W (STAPLE) ×2 IMPLANT
STEM UNIVERSAL REVISION 75X16 (Stem) ×2 IMPLANT
SUCTION FRAZIER TIP 10 FR DISP (SUCTIONS) ×2 IMPLANT
SUT ETHILON 3 0 PS 1 (SUTURE) IMPLANT
SUT VIC AB 0 CTB1 27 (SUTURE) ×6 IMPLANT
SUT VIC AB 1 CT1 27 (SUTURE) ×10
SUT VIC AB 1 CT1 27XBRD ANBCTR (SUTURE) ×5 IMPLANT
SUT VIC AB 2-0 CT1 27 (SUTURE) ×10
SUT VIC AB 2-0 CT1 TAPERPNT 27 (SUTURE) ×5 IMPLANT
SWAB COLLECTION DEVICE MRSA (MISCELLANEOUS) ×4 IMPLANT
SYR 20CC LL (SYRINGE) ×2 IMPLANT
SYR 30ML SLIP (SYRINGE) ×2 IMPLANT
SYR 50ML LL SCALE MARK (SYRINGE) ×2 IMPLANT
SYR CONTROL 10ML LL (SYRINGE) ×2 IMPLANT
TOWEL OR 17X24 6PK STRL BLUE (TOWEL DISPOSABLE) ×2 IMPLANT
TOWEL OR 17X26 10 PK STRL BLUE (TOWEL DISPOSABLE) ×4 IMPLANT
TRAY FOLEY CATH 16FRSI W/METER (SET/KITS/TRAYS/PACK) ×2 IMPLANT
TRAY REVISION SZ 2.5 (Knees) ×2 IMPLANT
TUBE ANAEROBIC SPECIMEN COL (MISCELLANEOUS) ×4 IMPLANT
WATER STERILE IRR 1000ML POUR (IV SOLUTION) ×6 IMPLANT
YANKAUER SUCT BULB TIP NO VENT (SUCTIONS) ×2 IMPLANT

## 2012-08-04 NOTE — Progress Notes (Signed)
Orthopedic Tech Progress Note Patient Details:  Brittany Klein 02/19/1955 [id number]  Patient ID: [name], female   DOB: 1955-03-09, 57 y.o.   MRN: [id number]  Viewed order from rn order list [name] 08/04/2012, 2:01 PM

## 2012-08-04 NOTE — Progress Notes (Signed)
Patient received from PACU.  Patient alert and oriented x4.  Patient oriented to room and unit.  CPM in place on left leg.  Vitals stable.  Will continue to monitor.

## 2012-08-04 NOTE — Brief Op Note (Signed)
08/04/2012  12:46 PM  PATIENT:  Brittany Klein  57 y.o. female  PRE-OPERATIVE DIAGNOSIS:  Left Total Knee Arthroplasty Loosening  POST-OPERATIVE DIAGNOSIS:  Left Total Knee Arthroplasty Loosening  PROCEDURE:  Procedure(s): LEFT TOTAL KNEE REVISION  SURGEON:  Surgeon(s): Cammy Copa, MD  ASSISTANT: carla bethune rnfa  ANESTHESIA:   spinal  EBL: 250 ml    Total I/O In: 3100 [I.V.:3100] Out: 450 [Urine:250; Blood:200]  BLOOD ADMINISTERED: none  DRAINS: none   LOCAL MEDICATIONS USED:  none  SPECIMEN:  No Specimen  COUNTS:  YES  TOURNIQUET:   Total Tourniquet Time Documented: Thigh (Left) - 111 minutes Thigh (Left) - 28 minutes Total: Thigh (Left) - 139 minutes   DICTATION: .Other Dictation: Dictation Number [id number]  PLAN OF CARE: Admit to inpatient   PATIENT DISPOSITION:  PACU - hemodynamically stable

## 2012-08-04 NOTE — Progress Notes (Signed)
Orthopedic Tech Progress Note Patient Details:  Brittany Klein 1955-02-20 [id number]  CPM Left Knee CPM Left Knee: On Left Knee Flexion (Degrees): 40 Left Knee Extension (Degrees): 0 Additional Comments: trapeze bar patient helper   [name] 08/04/2012, 2:01 PM

## 2012-08-04 NOTE — Anesthesia Procedure Notes (Signed)
Procedure Name: Intubation Date/Time: 08/04/2012 7:45 AM Performed by: Sherie Don Pre-anesthesia Checklist: Patient identified, Emergency Drugs available, Suction available, Patient being monitored and Timeout performed Patient Re-evaluated:Patient Re-evaluated prior to inductionOxygen Delivery Method: Circle system utilized Preoxygenation: Pre-oxygenation with 100% oxygen Intubation Type: IV induction Ventilation: Mask ventilation without difficulty Laryngoscope Size: Mac and 3 Grade View: Grade I Tube type: Oral Number of attempts: 1 Airway Equipment and Method: Stylet Placement Confirmation: positive ETCO2,  ETT inserted through vocal cords under direct vision and breath sounds checked- equal and bilateral Secured at: 22 cm Tube secured with: Tape Dental Injury: Teeth and Oropharynx as per pre-operative assessment

## 2012-08-04 NOTE — Transfer of Care (Signed)
Immediate Anesthesia Transfer of Care Note  [name]  Procedure(s) Performed: Procedure(s): LEFT TOTAL KNEE REVISION (Left)  Patient Location: PACU  Anesthesia Type:General  Level of Consciousness: awake  Airway & Oxygen Therapy: Patient Spontanous Breathing and Patient connected to face mask oxygen  Post-op Assessment: Report given to PACU RN, Post -op Vital signs reviewed and stable and Patient moving all extremities X 4  Post vital signs: Reviewed and stable  Complications: No apparent anesthesia complications

## 2012-08-04 NOTE — H&P (Addendum)
TOTAL KNEE REVISION ADMISSION H&P  Patient is being admitted forleft revision total knee arthroplasty.  Subjective:  Chief Complaint:left knee pain.  HPI: Brittany Klein, 57 y.o. female, has a history of pain and functional disability in the left knee(s) due to failed previous arthroplasty and patient has failed non-surgical conservative treatments for greater than 12 weeks to include NSAID's and/or analgesics, flexibility and strengthening excercises, use of assistive devices and activity modification. The indications for the revision of the total knee arthroplasty are loosening of one or more components and bearing surface wear leading to symptomatic synovitis. Onset of symptoms was gradual starting 2 years ago with gradually worsening course since that time.  Prior procedures on the left knee(s) include arthroplasty.  Patient currently rates pain in theleft knee(s) at 7 out of 10 with activity. There is worsening of pain with activity and weight bearing, pain that interferes with activities of daily living, crepitus and joint swelling.  Patient has evidence of prosthetic loosening by imaging studies. This condition presents safety issues increasing the risk of falls. This patient has had increasing pain preventing adls and work.  There is no current active infection.  There are no active problems to display for this patient.  Past Medical History  Diagnosis Date  . Back pain, chronic   . Diabetes mellitus without complication   . Dysrhythmia     Tachycardia, comes and goes. Increases with activity  . Arthritis   . Hyperlipemia   . History of blood transfusion   . Anemia     Past Surgical History  Procedure Laterality Date  . Knee surgery    . Joint replacement Left 10/2002  . Hernia repair      umbilical  . Carpal tunnel release Right   . Tubal ligation      Prescriptions prior to admission  Medication Sig Dispense Refill  . aspirin EC 81 MG tablet Take 81 mg by mouth daily.       Marland Kitchen glimepiride (AMARYL) 4 MG tablet Take 6 mg by mouth daily before breakfast.      . HYDROcodone-acetaminophen (NORCO/VICODIN) 5-325 MG per tablet Take 1 tablet by mouth every 6 (six) hours as needed for pain.  15 tablet  0  . Menthol, Topical Analgesic, (ICY HOT EX) Apply 1 application topically at bedtime.      . metFORMIN (GLUCOPHAGE-XR) 500 MG 24 hr tablet Take 1,000 mg by mouth 2 (two) times daily.      . methocarbamol (ROBAXIN) 500 MG tablet Take 500 mg by mouth every 8 (eight) hours as needed. For spasms      . simvastatin (ZOCOR) 40 MG tablet Take 40 mg by mouth at bedtime.       Allergies  Allergen Reactions  . Morphine And Related Itching    History  Substance Use Topics  . Smoking status: Former Smoker -- 15 years    Quit date: 12/01/1998  . Smokeless tobacco: Not on file  . Alcohol Use: No    No family history on file.    Review of Systems  Constitutional: Negative.   HENT: Negative.   Eyes: Negative.   Respiratory: Negative.   Cardiovascular: Negative.   Gastrointestinal: Negative.   Musculoskeletal: Positive for joint pain.  Skin: Negative.   Neurological: Negative.   Endo/Heme/Allergies: Negative.   Psychiatric/Behavioral: Negative.      Objective:  Physical Exam  Constitutional: She appears well-developed.  HENT:  Head: Normocephalic.  Eyes: Pupils are equal, round, and reactive to light.  Neck: Normal range of motion.  Cardiovascular: Normal rate.   Respiratory: Effort normal.  GI: Soft.  Neurological: She is alert.  Skin: Skin is warm.  left knee excellent rom and alignment - dp 2/4 - collaterals stable - effusion present  Vital signs in last 24 hours: Temp:  [97.7 F (36.5 C)] 97.7 F (36.5 C) (07/15 0614) Pulse Rate:  [69] 69 (07/15 0614) Resp:  [20] 20 (07/15 0614) BP: (114)/(76) 114/76 mmHg (07/15 0614) SpO2:  [98 %] 98 % (07/15 9562)  Labs:  There is no weight on file to calculate BMI.  Imaging Review Plain radiographs  demonstrate no degenerative joint disease of the left knee(s). The overall alignment is neutral.There is evidence of loosening of the femoral and tibial components. The bone quality appears to be fair for age and reported activity level. There is bone scan evidence of loosening - labs neg for infxn.  Assessment/Plan:  End stage arthritis,left knee(s) with failed previous arthroplasty.   The patient history, physical examination, clinical judgment of the provider and imaging studies are consistent with end stage degenerative joint disease of the left knee(s), previous total knee arthroplasty. Revision total knee arthroplasty is deemed medically necessary. The treatment options including medical management, injection therapy, arthroscopy and revision arthroplasty were discussed at length. The risks and benefits of revision total knee arthroplasty were presented and reviewed. The risks due to aseptic loosening, infection, stiffness, patella tracking problems, thromboembolic complications and other imponderables were discussed. The patient acknowledged the explanation, agreed to proceed with the plan and consent was signed. Patient is being admitted for inpatient treatment for surgery, pain control, PT, OT, prophylactic antibiotics, VTE prophylaxis, progressive ambulation and ADL's and discharge planning.The patient is planning to be discharged to skilled nursing facility

## 2012-08-04 NOTE — Anesthesia Postprocedure Evaluation (Signed)
Anesthesia Post Note  [name]  Procedure(s) Performed: Procedure(s) (LRB): LEFT TOTAL KNEE REVISION (Left)  Anesthesia type: General  Patient location: PACU  Post pain: Pain level controlled and Adequate analgesia  Post assessment: Post-op Vital signs reviewed, Patient's Cardiovascular Status Stable, Respiratory Function Stable, Patent Airway and Pain level controlled  Last Vitals:  Filed Vitals:   08/04/12 1615  BP: 118/52  Pulse: 74  Temp: 36.8 C  Resp: 12    Post vital signs: Reviewed and stable  Level of consciousness: awake, alert  and oriented  Complications: No apparent anesthesia complications

## 2012-08-04 NOTE — Progress Notes (Signed)
ANTICOAGULATION CONSULT NOTE - Initial Consult  Pharmacy Consult for Warfarin Indication: VTE prophylaxis  Allergies  Allergen Reactions  . Morphine And Related Itching   Patient Measurements: Weight = 98.4 kg on 07/28/12 Height = 166 cm on 07/28/12 Vital Signs: Temp: 97.6 F (36.4 C) (07/15 1646) Temp src: Oral (07/15 1646) BP: 109/48 mmHg (07/15 1646) Pulse Rate: 63 (07/15 1646) Labs:  Recent Labs  08/04/12 0627  APTT 30  LABPROT 12.1  INR 0.91   CrCl is unknown because there is no height on file for the current visit.  Medical History: Past Medical History  Diagnosis Date  . Back pain, chronic   . Diabetes mellitus without complication   . Dysrhythmia     Tachycardia, comes and goes. Increases with activity  . Arthritis   . Hyperlipemia   . History of blood transfusion   . Anemia    Assessment: 77 YOF s/p left TK revision to start Coumadin for VTE prophylaxis. Patient was not on any anticoagulation other than low-dose aspirin prior to admission. INR at baseline is within normal limits.   Goal of Therapy:  INR 2-3 Monitor platelets by anticoagulation protocol: Yes   Plan:  1. Coumadin 7.5mg  po x1 tonight. 2. Coumadin book and video to initiate education. 3. Daily PT/INR.   Link Snuffer, PharmD, BCPS Clinical Pharmacist [phone] 08/04/2012,5:40 PM

## 2012-08-04 NOTE — Anesthesia Preprocedure Evaluation (Addendum)
Anesthesia Evaluation  Patient identified by MRN, date of birth, ID band Patient awake    Reviewed: Allergy & Precautions, H&P , NPO status , Patient's Chart, lab work & pertinent test results  Airway Mallampati: I TM Distance: >3 FB Neck ROM: Full    Dental  (+) Dental Advisory Given and Teeth Intact   Pulmonary          Cardiovascular     Neuro/Psych    GI/Hepatic   Endo/Other  diabetes, Well Controlled, Type 2, Oral Hypoglycemic Agents  Renal/GU      Musculoskeletal   Abdominal   Peds  Hematology   Anesthesia Other Findings   Reproductive/Obstetrics                         Anesthesia Physical Anesthesia Plan  ASA: II  Anesthesia Plan: General   Post-op Pain Management:    Induction: Intravenous  Airway Management Planned: Oral ETT  Additional Equipment:   Intra-op Plan:   Post-operative Plan: Extubation in OR  Informed Consent: I have reviewed the patients History and Physical, chart, labs and discussed the procedure including the risks, benefits and alternatives for the proposed anesthesia with the patient or authorized representative who has indicated his/her understanding and acceptance.   Dental advisory given  Plan Discussed with: CRNA  Anesthesia Plan Comments:        Anesthesia Quick Evaluation

## 2012-08-04 NOTE — Preoperative (Signed)
Beta Blockers   Reason not to administer Beta Blockers:Not Applicable 

## 2012-08-05 ENCOUNTER — Encounter (HOSPITAL_COMMUNITY): Payer: Self-pay | Admitting: General Practice

## 2012-08-05 ENCOUNTER — Inpatient Hospital Stay (HOSPITAL_COMMUNITY): Payer: BC Managed Care – PPO

## 2012-08-05 LAB — BASIC METABOLIC PANEL
Chloride: 101 mEq/L (ref 96–112)
GFR calc Af Amer: >90 mL/min (ref >90)
GFR calc non Af Amer: >90 mL/min (ref >90)
Potassium: 4.1 mEq/L (ref 3.5–5.1)
Sodium: 138 mEq/L (ref 135–145)

## 2012-08-05 LAB — CBC
Hemoglobin: 7.5 g/dL — ABNORMAL LOW (ref 12.0–15.0)
MCHC: 32.6 g/dL (ref 30.0–36.0)
RDW: 14.4 % (ref 11.5–15.5)
WBC: 9.8 10*3/uL (ref 4.0–10.5)

## 2012-08-05 IMAGING — CR DG KNEE 1-2V*L*
2 series · 2 of 2 positions shown · non-contrast
Comparison: [DATE]

CLINICAL DATA: Postop redo left total knee replacement

LEFT KNEE - 1-2 VIEW

[x knee ap left]
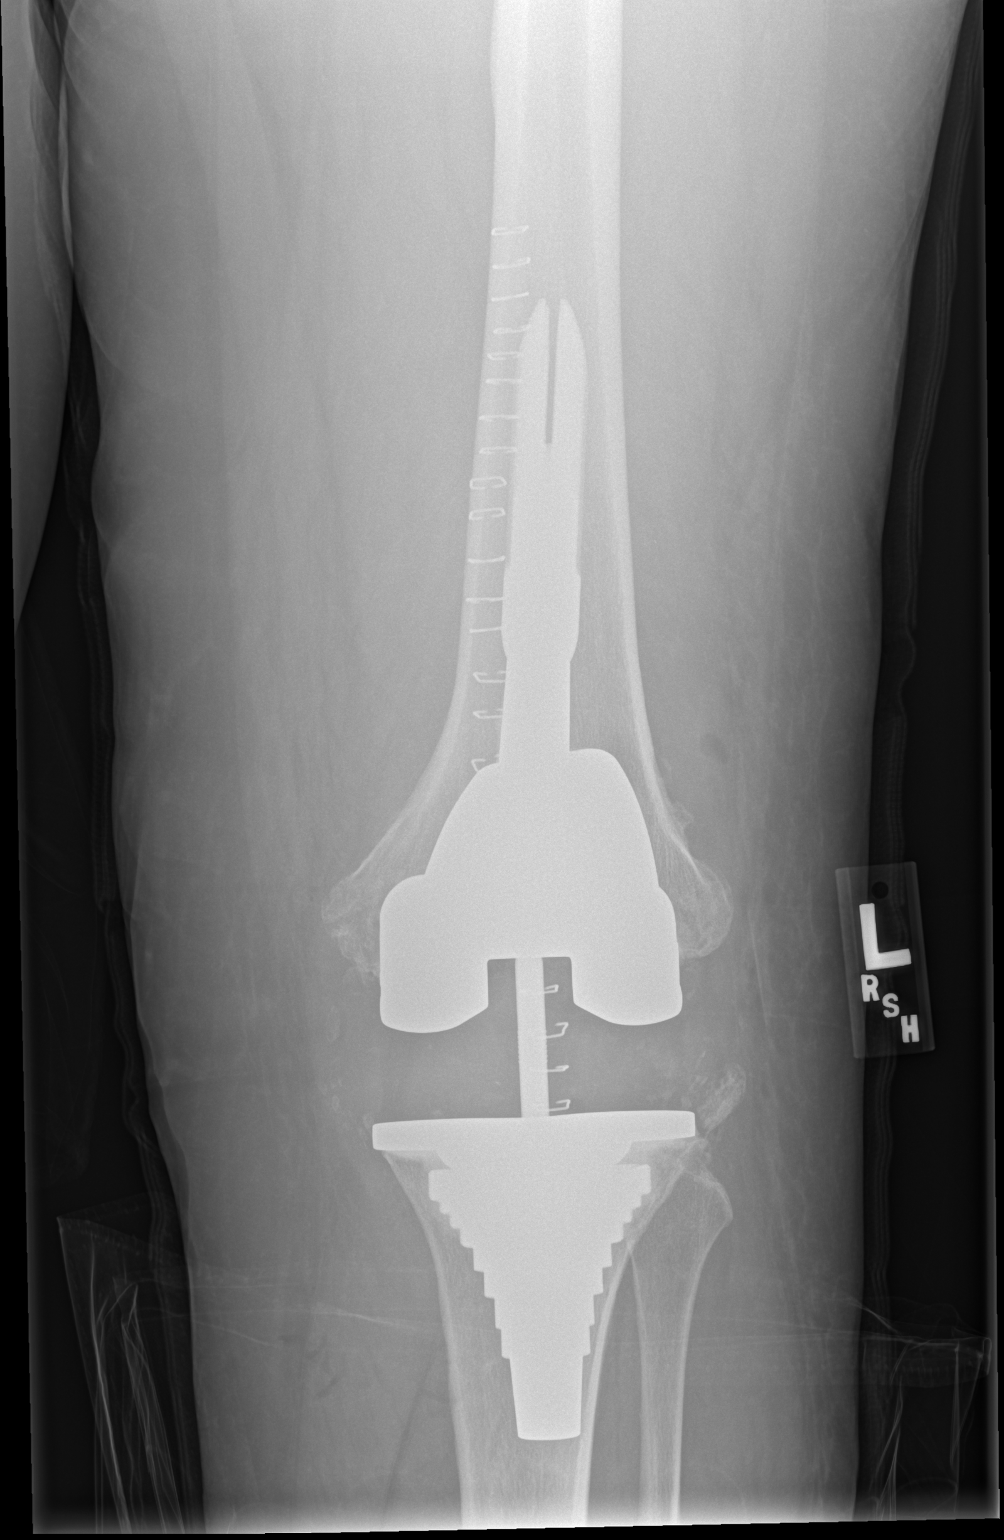

[x knee lat left]
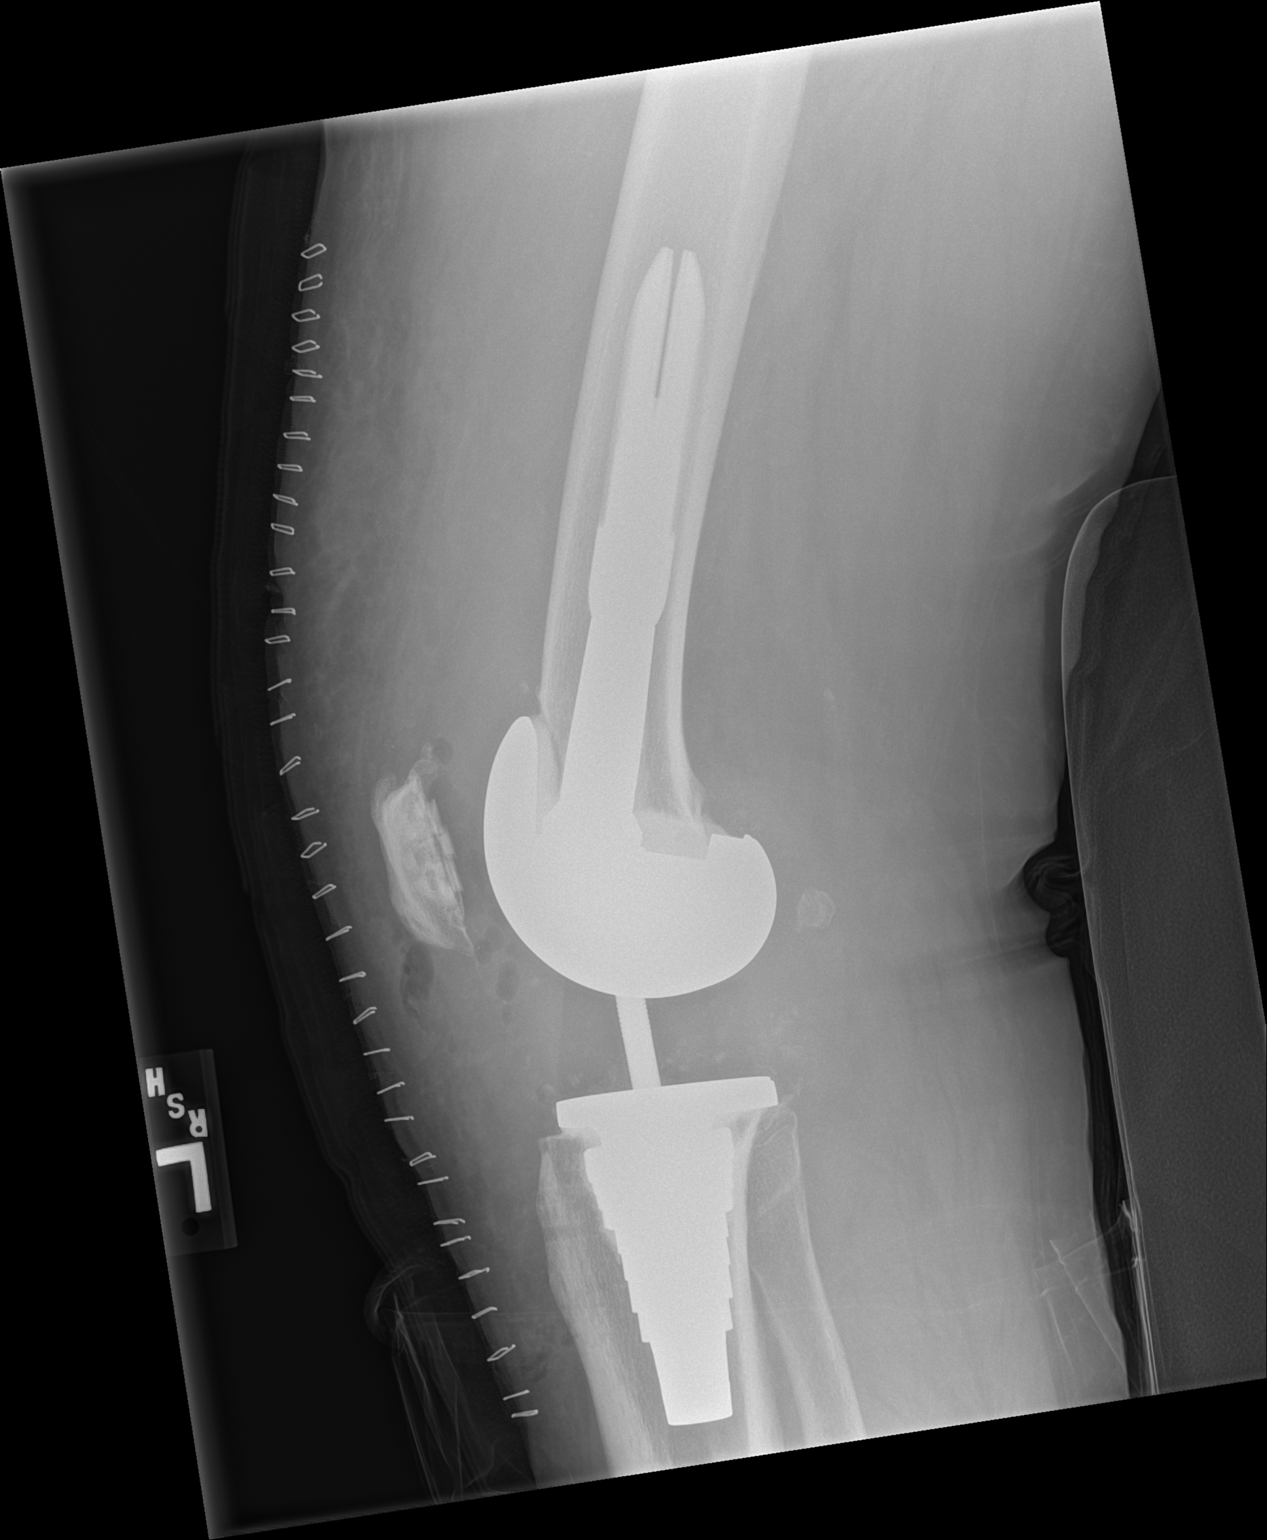

[2 of 2 positions shown; findings below may reference images not displayed]

FINDINGS: The patient has undergone a redo left total knee replacement with
extended femoral and tibial components.

Postoperative osseous fragment adjacent to the lateral tibial
plateau.  No fracture or dislocation.  Intra-articular joint space
is now widened.  Alignment appears otherwise anatomic.  There are
several small foci of expected postoperative intra-articular air.
Multiple midline skin staples.  No radiopaque foreign body.
IMPRESSION: Post redo left total hip replacement without evidence of
complication.

## 2012-08-05 MED ORDER — WARFARIN SODIUM 7.5 MG PO TABS
7.5000 mg | ORAL_TABLET | Freq: Once | ORAL | Status: AC
  Administered 2012-08-05: 7.5 mg via ORAL
  Filled 2012-08-05: qty 1

## 2012-08-05 NOTE — Progress Notes (Signed)
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 Morphine And Related Itching    Patient Measurements: Weight: 98.4kg on 07/28/2012  Vital Signs: Temp: 98.8 F (37.1 C) (07/16 0501) BP: 115/61 mmHg (07/16 0501) Pulse Rate: 77 (07/16 0501)  Labs:  Recent Labs  08/04/12 0627 08/05/12 0645  HGB  --  7.5*  HCT  --  23.0*  PLT  --  190  APTT 30  --   LABPROT 12.1 13.7  INR 0.91 1.07  CREATININE  --  0.70    CrCl is unknown because there is no height on file for the current visit.   Medications:  Scheduled:  . aspirin EC  81 mg Oral Daily  . docusate sodium  100 mg Oral BID  . glimepiride  6 mg Oral QAC breakfast  . insulin aspart  0-15 Units Subcutaneous TID WC  . metFORMIN  1,000 mg Oral BID WC  . simvastatin  40 mg Oral QHS  . warfarin   Does not apply Once  . Warfarin - Pharmacist Dosing Inpatient   Does not apply q1800    Assessment: This patient is an initial start, no prior warfarin. Day #2 INR 1.07. Received Coumadin 7.5 yesterday, will dose at 7.5mg  po x1 today and f/up INR in AM.  Patient not receiving lovenox at this time. Patient is concurrently taking aspirin.   Goal of Therapy:  INR 2-3 Monitor platelets by anticoagulation protocol: Yes   Plan:  - Warfarin 7.5mg  x 1 dose today - Follow up INR with AM labs  Harrold Donath E. Achilles Dunk, PharmD Clinical Pharmacist - Resident Pager: [phone] Pharmacy: [phone] 08/05/2012 11:35 AM

## 2012-08-05 NOTE — Progress Notes (Signed)
UR COMPLETED  

## 2012-08-05 NOTE — Evaluation (Signed)
Physical Therapy Evaluation Patient Details Name: Brittany Klein MRN: [id number] DOB: [birth year] Today's Date: 08/05/2012 Time: 4098-1191 PT Time Calculation (min): 38 min  PT Assessment / Plan / Recommendation History of Present Illness  [name]  Clinical Impression  Pt presents with limitations due to postoperative pain and restricted ROM as expected following knee surgery.  Demonstrates fair mobility on evaluation and will likely be on track for d/c directly home.  Daughter has new job beginning Monday and pt will be home for periods alone, educated on what services HH will and won't provide and will monitor progress for best d/c plan, however HHPT likely best option for now.     PT Assessment  Patient needs continued PT services    Follow Up Recommendations  Home health PT;Supervision - Intermittent    Does the patient have the potential to tolerate intense rehabilitation      Barriers to Discharge Decreased caregiver support daughter will start M-F job soon    Equipment Recommendations  Rolling walker with 5" wheels;3in1 (PT)    Recommendations for Other Services     Frequency 7X/week    Precautions / Restrictions Precautions Precautions: Knee Precaution Booklet Issued: No Precaution Comments: Knee Immobilizer at all times; CPM 2 hours daily Required Braces or Orthoses: Knee Immobilizer - Left Knee Immobilizer - Left: On at all times;On except when in CPM Restrictions Weight Bearing Restrictions: Yes LLE Weight Bearing: Weight bearing as tolerated Other Position/Activity Restrictions: no pillow behind left knee   Pertinent Vitals/Pain 7/10, premedicated >90 min prior to visit      Mobility  Bed Mobility Bed Mobility: Sit to Supine Sit to Supine: 3: Mod assist;HOB elevated Details for Bed Mobility Assistance: physical assistance to move operated leg to left EOB and to support trunk into flexion with verbal instruction for self-assist using ankle  support Transfers Transfers: Sit to Stand;Stand to Sit Sit to Stand: 4: Min assist;With upper extremity assist;From bed Stand to Sit: 4: Min assist;With upper extremity assist;With armrests;To chair/3-in-1 Details for Transfer Assistance: instructional and demonstrational cues for safest position of operated leg into sitting for high commode seat and low recliner seat; physical assist from lower surface to initiate stand and instructional cues for hand placement transitioning to RW Ambulation/Gait Ambulation/Gait Assistance: 5: Supervision Ambulation Distance (Feet): 25 Feet (in room, to bathroom, to chair) Assistive device: Rolling walker Ambulation/Gait Assistance Details: instructional and demonstrational cues for optimal sequencing of operated leg with RW to control load bearing through painful limb, pt rapidly able to integrate technique into activity, with occasional cues for complex movements like turning, avoiding bathroom door and navigating room environment.   Gait Pattern: Step-to pattern;Decreased stance time - left;Antalgic Gait velocity: unmeasured but severely decreased General Gait Details: ambulatory in room for 10-15 minutes to practice sequencing and then to access bathroom to brush teeth and urinate. Stairs: No    Exercises     PT Diagnosis: Acute pain;Difficulty walking  PT Problem List: Pain;Decreased knowledge of use of DME;Decreased mobility;Decreased range of motion;Decreased strength PT Treatment Interventions: Modalities;Patient/family education;Therapeutic exercise;Therapeutic activities;Functional mobility training;Gait training;DME instruction     PT Goals(Current goals can be found in the care plan section) Acute Rehab PT Goals Patient Stated Goal: go home PT Goal Formulation: With patient Time For Goal Achievement: 08/05/12 Potential to Achieve Goals: Good  Visit Information  Last PT Received On: 08/05/12 Assistance Needed: +1 History of Present  Illness: [name]  Prior Functioning  Home Living Family/patient expects to be discharged to:: Private residence Living Arrangements: Children;Other relatives Available Help at Discharge: Family;Available PRN/intermittently Type of Home: House Home Access: Level entry Home Layout: One level Home Equipment: None Prior Function Level of Independence: Independent Communication Communication: No difficulties Dominant Hand: Right    Cognition  Cognition Arousal/Alertness: Awake/alert Behavior During Therapy: WFL for tasks assessed/performed Overall Cognitive Status: Within Functional Limits for tasks assessed    Extremity/Trunk Assessment Upper Extremity Assessment Upper Extremity Assessment: Overall WFL for tasks assessed Lower Extremity Assessment Lower Extremity Assessment: LLE deficits/detail LLE Deficits / Details: total knee revision with full leg bandagePOD1 LLE: Unable to fully assess due to immobilization   Balance    End of Session PT - End of Session Equipment Utilized During Treatment: Gait belt;Left knee immobilizer Activity Tolerance: Patient tolerated treatment well;Patient limited by pain Patient left: in chair;with call bell/phone within reach Nurse Communication: Mobility status CPM Left Knee CPM Left Knee: Off  GP     Narda Amber [facility] 08/05/2012, 1:47 PM

## 2012-08-05 NOTE — Op Note (Signed)
Brittany Klein, Brittany Klein NO.:  [id number]  MEDICAL RECORD NO.:  [id number]  LOCATION:  [id number]                        FACILITY:  [facility]  PHYSICIAN:  Burnard Bunting, M.D.    DATE OF BIRTH:  1955/04/03  DATE OF PROCEDURE: DATE OF DISCHARGE:                              OPERATIVE REPORT   PREOPERATIVE DIAGNOSIS:  Loose left total knee replacement.  POSTOPERATIVE DIAGNOSIS:  Loose left total knee replacement.  PROCEDURE:  Left total knee arthroplasty, revision of femoral and tibial components.  SURGEON:  Burnard Bunting, M.D.  ASSISTANT:  Patrick Jupiter, RNFA  INDICATIONS:  Brittany Klein is a patient with 10 years out from total knee replacement, presents now for operative management of loose TKA.  No evidence of infection on preop laboratory studies.  PROCEDURE IN DETAIL:  The patient was brought to the operating room, where general endotracheal anesthesia was induced.  Perioperative IV antibiotics were continued.  Time-out was called.  Preoperative antibiotics were administered.  Left leg was prescrubbed with alcohol and Betadine, which was allowed to air dry.  Prepped with DuraPrep solution and draped in a sterile manner.  Brittany Klein was used cover the operative field.  Leg was elevated and exsanguinated.  Initial tourniquet time 1 hour and 51 minutes.  Anterior approach to the knee was made.  Skin and subcutaneous tissues were sharply divided. Synovectomy was performed.  Joint fluid clear was encountered and this was sent for culture.  Synovectomy was performed where particulate matter was present.  Following complete synovectomy, the femur was removed using combination of a twin saw blade and osteotome.  The tibia was then removed using thin osteotome and saw blade.  Excess cement was removed.  Patella was well fixed and intact.  At this time, the tibia was drilled and prepared.  Initially, it was going to be a stemmed component; however, the stem would not seat fully  allowing surface-to- surface contact.  After cleanup cut was made on the tibia, it was then prepared for press-fit sleeve fixation.  This gave excellent fixation. The tibia was cut in about 40 degrees of slope with AP alignment intact. Cleanup cut was then made on the distal end of the femur.  Chamfer cut was made.  The patient required posteromedial augment as well as 4-mm medial distal augment, 8-mm lateral distal augment.  With stemmed trial component in the femur as well as in the tibial compartment, the patient had excellent range of motion, good stability, varus and valgus stress at 0, 30 and 90 degrees with 20 spacer in place.  Box cut was made prior to placing the trial.  Trial component was removed.  Tourniquet was released.  Bleeding points were encountered and controlled with electrocautery.  Thorough irrigation was performed with 6 liters of irrigating solution.  Exparel was then placed into the joint capsule. Incision was then thoroughly irrigated again.  Tourniquet was placed back up for additional approximately 27 minutes.  True components were cemented into position with a 22 polyethylene spacer utilized.  Again, this gave excellent patellar tracking, excellent stability, varus and valgus stress at 0 and 30 degrees.  The patient achieved full extension. Again, tourniquet was  released.  Thorough irrigation was performed.  Bleeding points were encountered and controlled with electrocautery.  The incision was then closed using interrupted inverted #1 Vicryl suture, 0 Vicryl suture, 2-0 Vicryl suture and skin staples. The patient tolerated the procedure well without immediate complication. Bulky dressing and knee immobilizer were placed.     Burnard Bunting, M.D.     GSD/MEDQ  D:  08/04/2012  T:  08/05/2012  Job:  [id number]

## 2012-08-05 NOTE — Progress Notes (Signed)
Subjective: Pt stable - pain present   Objective: Vital signs in last 24 hours: Temp:  [97.6 F (36.4 C)-98.8 F (37.1 C)] 98.8 F (37.1 C) (07/16 0501) Pulse Rate:  [53-93] 77 (07/16 0501) Resp:  [10-27] 16 (07/16 0501) BP: (86-126)/(48-75) 115/61 mmHg (07/16 0501) SpO2:  [93 %-100 %] 100 % (07/16 0501)  Intake/Output from previous day: 07/15 0701 - 07/16 0700 In: 4480 [P.O.:480; I.V.:4000] Out: 1525 [Urine:1325; Blood:200] Intake/Output this shift:    Exam:  Neurovascular intact Sensation intact distally Intact pulses distally  Labs:  Recent Labs  08/05/12 0645  HGB 7.5*    Recent Labs  08/05/12 0645  WBC 9.8  RBC 2.81*  HCT 23.0*  PLT 190    Recent Labs  08/05/12 0645  NA 138  K 4.1  CL 101  CO2 29  BUN 18  CREATININE 0.70  GLUCOSE 176*  CALCIUM 8.5    Recent Labs  08/04/12 0627 08/05/12 0645  INR 0.91 1.07    Assessment/Plan: hgb low - may need tx am - hl iv - dc pca - cpm today for 2 hours per shift   DEAN,GREGORY SCOTT 08/05/2012, 7:56 AM

## 2012-08-06 LAB — CBC
HCT: 21 % — ABNORMAL LOW (ref 36.0–46.0)
Hemoglobin: 6.8 g/dL — CL (ref 12.0–15.0)
MCV: 82 fL (ref 78.0–100.0)
Platelets: 178 10*3/uL (ref 150–400)
RBC: 2.56 MIL/uL — ABNORMAL LOW (ref 3.87–5.11)
WBC: 8.2 10*3/uL (ref 4.0–10.5)

## 2012-08-06 LAB — WOUND CULTURE: Culture: NO GROWTH

## 2012-08-06 LAB — GLUCOSE, CAPILLARY
Glucose-Capillary: 197 mg/dL — ABNORMAL HIGH (ref 70–99)
Glucose-Capillary: 171 mg/dL — ABNORMAL HIGH (ref 70–99)
Glucose-Capillary: 107 mg/dL — ABNORMAL HIGH (ref 70–99)

## 2012-08-06 MED ORDER — WHITE PETROLATUM GEL
Status: AC
  Administered 2012-08-06: 0.2
  Filled 2012-08-06: qty 5

## 2012-08-06 MED ORDER — WARFARIN SODIUM 7.5 MG PO TABS
7.5000 mg | ORAL_TABLET | Freq: Once | ORAL | Status: AC
  Administered 2012-08-06: 7.5 mg via ORAL
  Filled 2012-08-06: qty 1

## 2012-08-06 NOTE — Progress Notes (Signed)
Physical Therapy Treatment Patient Details Name: Brittany Klein MRN: [id number] DOB: [birth year] Today's Date: 08/06/2012 Time: 8295-6213 PT Time Calculation (min): 11 min  PT Assessment / Plan / Recommendation  PT Comments   Pt with low Hgb this AM and getting ready to get blood.  Currently IV being placed so treatment limited to ex's.    Follow Up Recommendations  Home health PT;Supervision - Intermittent     Does the patient have the potential to tolerate intense rehabilitation     Barriers to Discharge        Equipment Recommendations  Rolling walker with 5" wheels;3in1 (PT)    Recommendations for Other Services    Frequency 7X/week   Progress towards PT Goals Progress towards PT goals: Not progressing toward goals - comment (low Hgb and need for blood)  Plan Current plan remains appropriate    Precautions / Restrictions Precautions Precautions: Knee Precaution Comments: Knee Immobilizer at all times; CPM 2 hours daily Required Braces or Orthoses: Knee Immobilizer - Left Knee Immobilizer - Left: On at all times;On except when in CPM Restrictions LLE Weight Bearing: Weight bearing as tolerated Other Position/Activity Restrictions: no pillow behind left knee   Pertinent Vitals/Pain See flow sheet    Mobility  Ambulation/Gait Ambulation/Gait Assistance: Not tested (comment) (Pt having IV started to get blood.)    Exercises Total Joint Exercises Ankle Circles/Pumps: AROM;Both;10 reps;Seated Quad Sets: Strengthening;Left;10 reps;Seated Heel Slides: AAROM;Left;10 reps;Seated Straight Leg Raises: AAROM;Left;10 reps;Seated Knee Flexion: AAROM;Left;5 reps;Seated Goniometric ROM: Grossly 50 degrees flexion - difficult to assess due to wrap.   PT Diagnosis:    PT Problem List:   PT Treatment Interventions:     PT Goals (current goals can now be found in the care plan section)    Visit Information  Last PT Received On: 08/06/12 Assistance Needed: +1 History of Present  Illness: [name]. Pt with decr Hgb and to get 1 unit blood.    Subjective Data      Cognition  Cognition Arousal/Alertness: Awake/alert Behavior During Therapy: WFL for tasks assessed/performed Overall Cognitive Status: Within Functional Limits for tasks assessed    Balance     End of Session PT - End of Session Activity Tolerance: Other (comment) (Treatment limited to ex's due to pt getting IV for blood) CPM Left Knee CPM Left Knee: Off   GP     Brittany Klein 08/06/2012, 9:07 AM  [facility] PT [phone]

## 2012-08-06 NOTE — Progress Notes (Signed)
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 Morphine And Related Itching    Patient Measurements: Weight: 98.4kg on 07/28/2012  Vital Signs: Temp: 98.3 F (36.8 C) (07/17 1305) Temp src: Oral (07/17 1305) BP: 127/82 mmHg (07/17 1305) Pulse Rate: 94 (07/17 1305)  Labs:  Recent Labs  08/04/12 0627 08/05/12 0645 08/06/12 0700  HGB  --  7.5* 6.8*  HCT  --  23.0* 21.0*  PLT  --  190 178  APTT 30  --   --   LABPROT 12.1 13.7 17.2*  INR 0.91 1.07 1.44  CREATININE  --  0.70  --     CrCl is unknown because there is no height on file for the current visit.   Medications:  Scheduled:  . aspirin EC  81 mg Oral Daily  . docusate sodium  100 mg Oral BID  . glimepiride  6 mg Oral QAC breakfast  . insulin aspart  0-15 Units Subcutaneous TID WC  . metFORMIN  1,000 mg Oral BID WC  . simvastatin  40 mg Oral QHS  . warfarin   Does not apply Once  . Warfarin - Pharmacist Dosing Inpatient   Does not apply q1800    Assessment: 69 YOF s/p left total knee revision, on Day #3 coumadin for VTE px, INR 1.44, trending up nicely. Hgb dropping slightly 7.5 > 6.8, plt stable = 178, received 1 unit PRBC, No bleeding noted per chart.  Goal of Therapy:  INR 2-3 Monitor platelets by anticoagulation protocol: Yes   Plan:  - Warfarin 7.5mg  x 1 dose today - Follow up INR with AM labs  Bayard Hugger, PharmD, BCPS  Clinical Pharmacist  Pager: [phone]   08/06/2012 2:12 PM

## 2012-08-06 NOTE — Evaluation (Signed)
Occupational Therapy Evaluation Patient Details Name: Brittany Klein MRN: [id number] DOB: 1955-05-22 Today's Date: 08/06/2012 Time: 4098-1191 OT Time Calculation (min): 22 min  OT Assessment / Plan / Recommendation History of present illness [name]. Pt with decr Hgb and to get 1 unit blood today.   Clinical Impression   Presents with limitations due to postoperative pain and restricted left knee ROM.  Patient is currently overall supervision-min assist with BADL and functional mobility.  She reports that she has experience with rehab from knee replacement in 2004, "I know what I'm in for".  Patient will be home alone during the day while daughter works therefore OT will follow her acutely however do not foresee need for HHOT at this time.    OT Assessment  Patient needs continued OT Services    Follow Up Recommendations  No OT follow up    Barriers to Discharge Decreased caregiver support Patient will be home alone during the day when her daughter is at work.  Equipment Recommendations  3 in 1 bedside comode (Reports she will sponge bath; declined shower DME for now)    Frequency  Min 2X/week    Precautions / Restrictions Precautions Precautions: Knee Precaution Comments: Knee Immobilizer at all times; CPM 2 hours daily Required Braces or Orthoses: Knee Immobilizer - Left Knee Immobilizer - Left: On at all times;On except when in CPM Restrictions Weight Bearing Restrictions: Yes LLE Weight Bearing: Weight bearing as tolerated Other Position/Activity Restrictions: no pillow behind left knee   Pertinent Vitals/Pain 4/10 before activity, 6/10 after activity, premedicated, rest and repositioned.    ADL  Upper Body Bathing: Simulated;Set up Where Assessed - Upper Body Bathing: Unsupported sitting Lower Body Bathing: Simulated;Minimal assistance Where Assessed - Lower Body Bathing: Supported standing;Unsupported sitting Upper Body Dressing: Simulated;Set up Where  Assessed - Upper Body Dressing: Unsupported sitting Lower Body Dressing: Simulated;Performed;Minimal assistance Where Assessed - Lower Body Dressing: Supported standing;Unsupported sitting ADL Comments: LLE with ace which makes the task of sock of and on difficult.  Patient reports that MD to remove ace tomorrow.    OT Diagnosis: Generalized weakness;Acute pain  OT Problem List: Decreased activity tolerance;Decreased knowledge of use of DME or AE;Decreased safety awareness;Pain OT Treatment Interventions: Self-care/ADL training;Energy conservation;DME and/or AE instruction;Therapeutic activities;Patient/family education   OT Goals(Current goals can be found in the care plan section) Acute Rehab OT Goals Patient Stated Goal: go home and take care of myself. OT Goal Formulation: With patient Time For Goal Achievement: 08/20/12 Potential to Achieve Goals: Good ADL Goals Pt Will Perform Lower Body Bathing: with modified independence (AE PRN) Pt Will Perform Lower Body Dressing: with modified independence (AE PRN) Pt Will Transfer to Toilet: with modified independence;bedside commode Pt Will Perform Toileting - Clothing Manipulation and hygiene: with modified independence  Visit Information  Last OT Received On: 08/06/12 Assistance Needed: +1 History of Present Illness: [name]. Pt with decr Hgb and to get 1 unit blood.       Cognition  Cognition Arousal/Alertness: Awake/alert Behavior During Therapy: WFL for tasks assessed/performed Overall Cognitive Status: Within Functional Limits for tasks assessed    Extremity/Trunk Assessment Upper Extremity Assessment Upper Extremity Assessment: Overall WFL for tasks assessed Lower Extremity Assessment Lower Extremity Assessment: Defer to PT evaluation     Mobility Transfers Sit to Stand: With upper extremity assist;4: Min guard;From chair/3-in-1;From bed;With armrests Stand to Sit: With upper extremity assist;With armrests;To  chair/3-in-1;To bed;4: Min guard Details for Transfer Assistance: instructional  and demonstrational cues for safest position of operated leg for high and low surfaces; Practiced side stepping with RW and close supervision to simulate technique she will need to use to enter/exit bathroom doorway.     End of Session OT - End of Session Equipment Utilized During Treatment: Rolling walker;Left knee immobilizer Activity Tolerance: Patient tolerated treatment well Patient left: in chair;with call bell/phone within reach;with family/visitor present CPM Left Knee CPM Left Knee: Off  GO     Leocadia Idleman 08/06/2012, 10:13 AM

## 2012-08-06 NOTE — Progress Notes (Signed)
Subjective: Pt stable - moving well   Objective: Vital signs in last 24 hours: Temp:  [99 F (37.2 C)-99.3 F (37.4 C)] 99.3 F (37.4 C) (07/17 0559) Pulse Rate:  [85-102] 100 (07/17 0559) Resp:  [16] 16 (07/17 0559) BP: (121-136)/(62-70) 121/63 mmHg (07/17 0559) SpO2:  [92 %-100 %] 100 % (07/17 0559)  Intake/Output from previous day: 07/16 0701 - 07/17 0700 In: 360 [P.O.:360] Out: 500 [Urine:500] Intake/Output this shift:    Exam:  Neurovascular intact Sensation intact distally Intact pulses distally  Labs:  Recent Labs  08/05/12 0645 08/06/12 0700  HGB 7.5* 6.8*    Recent Labs  08/05/12 0645 08/06/12 0700  WBC 9.8 8.2  RBC 2.81* 2.56*  HCT 23.0* 21.0*  PLT 190 178    Recent Labs  08/05/12 0645  NA 138  K 4.1  CL 101  CO2 29  BUN 18  CREATININE 0.70  GLUCOSE 176*  CALCIUM 8.5    Recent Labs  08/05/12 0645 08/06/12 0700  INR 1.07 1.44    Assessment/Plan: hgb low - tx 1 u prbc - mobilize with PT - ready for tx to snf friday   Brittany Klein SCOTT 08/06/2012, 8:17 AM

## 2012-08-06 NOTE — Progress Notes (Signed)
Physical Therapy Treatment Patient Details Name: Brittany Klein MRN: [id number] DOB: 1955-09-21 Today's Date: 08/06/2012 Time: 4098-1191 PT Time Calculation (min): 25 min  PT Assessment / Plan / Recommendation  PT Comments   Pt making good progress.  Follow Up Recommendations  Home health PT;Supervision - Intermittent     Does the patient have the potential to tolerate intense rehabilitation     Barriers to Discharge        Equipment Recommendations  Rolling walker with 5" wheels;3in1 (PT)    Recommendations for Other Services    Frequency 7X/week   Progress towards PT Goals Progress towards PT goals: Progressing toward goals  Plan Current plan remains appropriate    Precautions / Restrictions Precautions Precautions: Knee Required Braces or Orthoses: Knee Immobilizer - Left Restrictions LLE Weight Bearing: Weight bearing as tolerated Other Position/Activity Restrictions: no pillow behind left knee   Pertinent Vitals/Pain Lt knee pain. Repositioned.    Mobility  Bed Mobility Bed Mobility: Sit to Supine Sit to Supine: 4: Min assist Details for Bed Mobility Assistance: assist to bring rt leg up to bed Transfers Sit to Stand: 4: Min guard;With upper extremity assist;With armrests;From chair/3-in-1 Stand to Sit: 4: Min guard;With upper extremity assist;To bed Details for Transfer Assistance: Correct placement of hands and good technique Ambulation/Gait Ambulation/Gait Assistance: 5: Supervision Ambulation Distance (Feet): 125 Feet Assistive device: Rolling walker Ambulation/Gait Assistance Details: No cues for sequence Gait Pattern: Step-through pattern;Decreased stance time - left;Decreased step length - right Gait velocity: unmeasured but severely decreased    Exercises     PT Diagnosis:    PT Problem List:   PT Treatment Interventions:     PT Goals (current goals can now be found in the care plan section)    Visit Information  Last PT Received On:  08/06/12 History of Present Illness: [name]. Pt with decr Hgb and to getting 1 unit blood.    Subjective Data      Cognition  Cognition Arousal/Alertness: Awake/alert Behavior During Therapy: WFL for tasks assessed/performed Overall Cognitive Status: Within Functional Limits for tasks assessed    Balance  Balance Balance Assessed: Yes Static Standing Balance Static Standing - Balance Support: Bilateral upper extremity supported Static Standing - Level of Assistance: 5: Stand by assistance  End of Session PT - End of Session Equipment Utilized During Treatment: Gait belt;Left knee immobilizer Activity Tolerance: Patient tolerated treatment well Patient left: in bed;with call bell/phone within reach Nurse Communication: Mobility status CPM Left Knee CPM Left Knee: Off   GP     Shatera Rennert 08/06/2012, 3:19 PM  [facility] PT [phone]

## 2012-08-06 NOTE — Progress Notes (Signed)
Orthopedic Tech Progress Note Patient Details:  Brittany Klein [birth year] [id number] Put on cpm at 8:15 pm LLE 0-40 Patient ID: Brittany Klein, female   DOB: 16-Oct-1955, 57 y.o.   MRN: [id number]   Brittany Klein 08/06/2012, 8:14 PM

## 2012-08-06 NOTE — Care Management Note (Signed)
    Page 1 of 2   08/07/2012     2:17:19 PM   CARE MANAGEMENT NOTE 08/07/2012  Patient:  Brittany Klein, Brittany Klein   Account Number:  [id number]  Date Initiated:  08/05/2012  Documentation initiated by:  [facility]  Subjective/Objective Assessment:   admitted postop left total knee arthroplasty     Action/Plan:   plan return home with HHPT and HHRN   Anticipated DC Date:  08/07/2012   Anticipated DC Plan:  HOME W HOME HEALTH SERVICES      DC Planning Services  CM consult      Choice offered to / List presented to:     DME arranged  CPM  3-N-1  WALKER - ROLLING      DME agency  [employer]     HH arranged  HH-1 RN  HH-2 PT      HH agency  [employer]   Status of service:  Completed, signed off Medicare Important Message given?   (If response is "NO", the following Medicare IM given date fields will be blank) Date Medicare IM given:   Date Additional Medicare IM given:    Discharge Disposition:  HOME W HOME HEALTH SERVICES  Per UR Regulation:    If discussed at Long Length of Stay Meetings, dates discussed:    Comments:  08/05/12 Patient set up with [facility] for HHPT by MD office. Patient on coumadin, contacted Debbie with [facility] and added [facility]. Spoke with patient, no change in discharge plan, daughter will be able to assist patient after d/c. Jacquelynn Cree RN, BSN, CCM

## 2012-08-07 ENCOUNTER — Encounter (HOSPITAL_COMMUNITY): Payer: Self-pay | Admitting: Orthopedic Surgery

## 2012-08-07 LAB — PROTIME-INR
INR: 1.89 — ABNORMAL HIGH (ref 0.00–1.49)
Prothrombin Time: 21.1 seconds — ABNORMAL HIGH (ref 11.6–15.2)

## 2012-08-07 LAB — CBC
Hemoglobin: 8 g/dL — ABNORMAL LOW (ref 12.0–15.0)
MCH: 27 pg (ref 26.0–34.0)
RBC: 2.96 MIL/uL — ABNORMAL LOW (ref 3.87–5.11)

## 2012-08-07 LAB — TYPE AND SCREEN
ABO/RH(D): O POS
Unit division: 0

## 2012-08-07 LAB — GLUCOSE, CAPILLARY: Glucose-Capillary: 157 mg/dL — ABNORMAL HIGH (ref 70–99)

## 2012-08-07 MED ORDER — OXYCODONE HCL 10 MG PO TABS: 10.0000 mg | ORAL_TABLET | ORAL | Status: DC | PRN

## 2012-08-07 MED ORDER — METHOCARBAMOL 500 MG PO TABS: 500.0000 mg | ORAL_TABLET | Freq: Three times a day (TID) | ORAL | Status: DC | PRN

## 2012-08-07 MED ORDER — OXYCODONE HCL 5 MG PO TABS
10.0000 mg | ORAL_TABLET | ORAL | Status: DC | PRN
  Administered 2012-08-07: 10 mg via ORAL
  Filled 2012-08-07: qty 2

## 2012-08-07 MED ORDER — WARFARIN SODIUM 5 MG PO TABS: 5.0000 mg | ORAL_TABLET | Freq: Every day | ORAL | Status: DC

## 2012-08-07 MED ORDER — DSS 100 MG PO CAPS: 100.0000 mg | ORAL_CAPSULE | Freq: Two times a day (BID) | ORAL | Status: DC

## 2012-08-07 NOTE — Progress Notes (Signed)
Occupational Therapy Treatment Patient Details Name: Brittany Klein MRN: [id number] DOB: 1955/12/07 Today's Date: 08/07/2012 Time: 4132-4401 OT Time Calculation (min): 25 min  OT Assessment / Plan / Recommendation  OT comments  Pt progressing well with decreased assist required for LB ADLs and ADL mobility.   Follow Up Recommendations  No OT follow up    Barriers to Discharge   None    Equipment Recommendations  3 in 1 bedside comode;Tub/shower bench    Recommendations for Other Services    Frequency     Progress towards OT Goals Progress towards OT goals: Progressing toward goals  Plan Discharge plan remains appropriate    Precautions / Restrictions Precautions Precautions: Knee Precaution Booklet Issued: No Precaution Comments: Knee Immobilizer at all times; CPM 2 hours daily Required Braces or Orthoses: Knee Immobilizer - Left Knee Immobilizer - Left: On at all times;On except when in CPM Restrictions Weight Bearing Restrictions: Yes LLE Weight Bearing: Weight bearing as tolerated Other Position/Activity Restrictions: no pillow behind left knee   Pertinent Vitals/Pain 2/10 L knee    ADL  Grooming: Performed;Wash/dry hands;Wash/dry face;Supervision/safety Where Assessed - Grooming: Supported standing Lower Body Bathing: Performed;Minimal assistance;Min guard Lower Body Dressing: Performed;Min guard;Minimal assistance Toilet Transfer: Research scientist (life sciences) Method: Sit to Barista: Regular height toilet;Grab bars Toileting - Clothing Manipulation and Hygiene: Performed;Supervision/safety;Min guard Where Assessed - Engineer, mining and Hygiene: Standing Equipment Used: Gait belt;Rolling walker Transfers/Ambulation Related to ADLs: cues for safety with RW for closeness to sink during grooming and ADL ADL Comments: cues for safety    OT Diagnosis:    OT Problem List:   OT Treatment Interventions:     OT  Goals(current goals can now be found in the care plan section) Acute Rehab OT Goals Patient Stated Goal: go home and take care of myself.  Visit Information  Last OT Received On: 08/07/12 Assistance Needed: +1 History of Present Illness: [name].     Subjective Data      Prior Functioning       Cognition  Cognition Arousal/Alertness: Awake/alert Behavior During Therapy: WFL for tasks assessed/performed Overall Cognitive Status: Within Functional Limits for tasks assessed    Mobility  Bed Mobility Bed Mobility: Not assessed Supine to Sit: 6: Modified independent (Device/Increase time);With rails Details for Bed Mobility Assistance: pt demonstrates ability to self-assist operated leg to come to sitting at EOB Transfers Transfers: Sit to Stand;Stand to Sit Sit to Stand: 5: Supervision;With upper extremity assist;From chair/3-in-1;From toilet Stand to Sit: 5: Supervision;With upper extremity assist;To chair/3-in-1;To toilet Details for Transfer Assistance: cues for safety       Balance Balance Balance Assessed: Yes Static Standing Balance Static Standing - Balance Support: During functional activity Static Standing - Level of Assistance: 5: Stand by assistance Dynamic Standing Balance Dynamic Standing - Balance Support: During functional activity Dynamic Standing - Level of Assistance: 5: Stand by assistance;4: Min assist   End of Session OT - End of Session Activity Tolerance: Patient tolerated treatment well Patient left: in chair;with call bell/phone within reach;with family/visitor present  GO     Galen Manila 08/07/2012, 1:22 PM

## 2012-08-07 NOTE — Progress Notes (Signed)
Subjective: Pt stable but nauseated   Objective: Vital signs in last 24 hours: Temp:  [98.2 F (36.8 C)-99.3 F (37.4 C)] 99.3 F (37.4 C) (07/18 0628) Pulse Rate:  [76-112] 112 (07/18 0628) Resp:  [16-18] 18 (07/18 0628) BP: (105-127)/(60-82) 106/75 mmHg (07/18 0628) SpO2:  [97 %-100 %] 99 % (07/18 0628)  Intake/Output from previous day: 07/17 0701 - 07/18 0700 In: 630 [P.O.:360; I.V.:250; Blood:20] Out: -  Intake/Output this shift:    Exam:  Neurovascular intact Sensation intact distally Intact pulses distally  Labs:  Recent Labs  08/05/12 0645 08/06/12 0700 08/07/12 0610  HGB 7.5* 6.8* 8.0*    Recent Labs  08/06/12 0700 08/07/12 0610  WBC 8.2 10.0  RBC 2.56* 2.96*  HCT 21.0* 24.9*  PLT 178 193    Recent Labs  08/05/12 0645  NA 138  K 4.1  CL 101  CO2 29  BUN 18  CREATININE 0.70  GLUCOSE 176*  CALCIUM 8.5    Recent Labs  08/06/12 0700 08/07/12 0610  INR 1.44 1.89*    Assessment/Plan: Plan to change to norco - incision ok dressing changed - ok to dc if pain controlled and not nauseated   Kortez Murtagh SCOTT 08/07/2012, 8:06 AM

## 2012-08-07 NOTE — Progress Notes (Signed)
Physical Therapy Treatment Patient Details Name: Brittany Klein MRN: [id number] DOB: 1955/02/07 Today's Date: 08/07/2012 Time: 9147-8295 PT Time Calculation (min): 38 min  PT Assessment / Plan / Recommendation  PT Comments   Pt continues to show excellent progression.  Gait remains slow but pt is safe and mobile with use of RW.  Observed knee instability in standing and with single gait cycle and re-applied knee immobilizer. Pt request clarification if MD prefers to keep KI in place for mobility.  Educated on elevation and ice application for control of pain and edema, pt verbalizes.  Hopeful to d/c home today if nausea remains in control and pain is managed.  Confident in her ability to function at home with intermittent assist and [facility] services as currently planned.    Follow Up Recommendations  Home health PT;Supervision - Intermittent     Does the patient have the potential to tolerate intense rehabilitation     Barriers to Discharge        Equipment Recommendations       Recommendations for Other Services    Frequency 7X/week   Progress towards PT Goals Progress towards PT goals: Progressing toward goals  Plan Current plan remains appropriate    Precautions / Restrictions Precautions Precautions: Knee Precaution Comments: Knee Immobilizer at all times; CPM 2 hours daily Required Braces or Orthoses: Knee Immobilizer - Left Knee Immobilizer - Left: On at all times;On except when in CPM Restrictions Weight Bearing Restrictions: Yes LLE Weight Bearing: Weight bearing as tolerated Other Position/Activity Restrictions: no pillow behind left knee   Pertinent Vitals/Pain 6/10, feels tight;  Performed ROM and light massage around knee to assist with fluid removal with observable increase in comfort and ROM.  Elevated and applied ice to anterior knee with instruction for 15-20 min ice ON, remove ice 30 minutes minimum before reapply.    Mobility  Bed Mobility Bed Mobility: Supine to  Sit Supine to Sit: 6: Modified independent (Device/Increase time);With rails Details for Bed Mobility Assistance: pt demonstrates ability to self-assist operated leg to come to sitting at EOB Transfers Transfers: Sit to Stand;Stand to Sit Sit to Stand: 5: Supervision;With upper extremity assist;From bed;From chair/3-in-1 Stand to Sit: 5: Supervision;With upper extremity assist;To bed;To chair/3-in-1 Details for Transfer Assistance: standby for safety during transition and to observe consistency of use of safety instruction for pain management and protect surgical side Ambulation/Gait Ambulation/Gait Assistance: 5: Supervision Ambulation Distance (Feet): 100 Feet Assistive device: Rolling walker Ambulation/Gait Assistance Details: cues to increase step length with non-operated side to begin transition to step-through pattern, needs further practice; cues for safe use of UE, relax shoulders, lowered RW 1 inch;  Gait Pattern: Step-to pattern;Step-through pattern;Decreased stance time - left;Decreased weight shift to left;Antalgic Stairs: Yes Stairs Assistance: 5: Supervision;6: Modified independent (Device/Increase time) Stairs Assistance Details (indicate cue type and reason): instructional and demonstrational cues for ascend/descend technique for single threshold, pt demonstrates safety and increased independence with up backward, down forward  Stair Management Technique: No rails;Step to pattern;Backwards;Forwards;With walker Number of Stairs: 1    Exercises Total Joint Exercises Ankle Circles/Pumps: AROM;Both;10 reps;Seated Quad Sets: Strengthening;Left;10 reps;Seated Short Arc Quad: Strengthening;Left;5 reps;Seated Straight Leg Raises: AAROM;Left;10 reps;Seated Long Arc Quad: Strengthening;AAROM;Left;10 reps;Seated Knee Flexion: AAROM;Left;5 reps;Seated Goniometric ROM: estimated by observation at 75-85 degrees is AAROM seated knee flexion   PT Diagnosis:    PT Problem List:   PT  Treatment Interventions:     PT Goals (current goals can now be found in the care plan  section) Acute Rehab PT Goals Patient Stated Goal: go home and take care of myself. Time For Goal Achievement: 08/12/12 Potential to Achieve Goals: Good  Visit Information  Last PT Received On: 08/07/12 Assistance Needed: +1 History of Present Illness: [name].     Subjective Data  Subjective: I hope I can go home but I got sick again this morning.  Dr. Riley Churches my medicine Patient Stated Goal: go home and take care of myself.   Cognition  Cognition Arousal/Alertness: Awake/alert Behavior During Therapy: WFL for tasks assessed/performed Overall Cognitive Status: Within Functional Limits for tasks assessed    Balance  Balance Balance Assessed: Yes Static Standing Balance Static Standing - Balance Support: During functional activity (pt stands unsupported to pull up pants) Static Standing - Level of Assistance: 5: Stand by assistance  End of Session PT - End of Session Equipment Utilized During Treatment: Gait belt;Left knee immobilizer Activity Tolerance: Patient tolerated treatment well Patient left: in chair;with call bell/phone within reach Nurse Communication: Mobility status   GP     Dennis Bast 08/07/2012, 10:33 AM

## 2012-08-09 LAB — ANAEROBIC CULTURE

## 2012-08-14 NOTE — Discharge Summary (Signed)
Physician Discharge Summary  Patient ID: Brittany Klein MRN: [id number] DOB/AGE: [birth year] 56 y.o.  Admit date: 08/04/2012 Discharge date: 08/07/2012 Admission Diagnoses:  Loose left tka  Discharge Diagnoses:  Same  Surgeries: Procedure(s): LEFT TOTAL KNEE REVISION on 08/04/2012   Consultants:    Discharged Condition: Stable  Hospital Course: Brittany Klein is Brittany 57 y.o. female who was admitted 08/04/2012 with a chief complaint of knee pain, and found to have a diagnosis of loose TKA.  They were brought to the operating room on 08/04/2012 and underwent the above named procedures. She tolerated the procedure well and mobilized with PT before dc.   Antibiotics given:  Anti-infectives   Start     Dose/Rate Route Frequency Ordered Stop   08/04/12 1715  ceFAZolin (ANCEF) IVPB 2 g/50 mL premix     2 g 100 mL/hr over 30 Minutes Intravenous 3 times per day 08/04/12 1712 08/04/12 2257   08/04/12 1056  cefUROXime (ZINACEF) injection  Status:  Discontinued       As needed 08/04/12 1056 08/04/12 1225   08/04/12 0600  ceFAZolin (ANCEF) IVPB 2 g/50 mL premix     2 g 100 mL/hr over 30 Minutes Intravenous On call to O.R. 08/03/12 1420 08/04/12 1132    .  Recent vital signs:  Filed Vitals:   08/07/12 0628  BP: 106/75  Pulse: 112  Temp: 99.3 F (37.4 C)  Resp: 18    Recent laboratory studies:  Results for orders placed during the hospital encounter of 08/04/12  URINE CULTURE      Result Value Range   Specimen Description URINE, RANDOM     Special Requests ADDED [id number]     Culture  Setup Time 07/29/2012 17:14     Colony Count NO GROWTH     Culture NO GROWTH     Report Status 07/30/2012 FINAL    ANAEROBIC CULTURE      Result Value Range   Specimen Description WOUND KNEE LEFT     Special Requests LEFT KNEE TOTAL ARTHROPLASTY     Gram Stain       Value: RARE WBC PRESENT,BOTH PMN AND MONONUCLEAR     NO SQUAMOUS EPITHELIAL CELLS SEEN     NO ORGANISMS SEEN   Culture NO ANAEROBES  ISOLATED     Report Status 08/09/2012 FINAL    WOUND CULTURE      Result Value Range   Specimen Description WOUND KNEE LEFT     Special Requests LEFT KNEE TOTAL ARTHROPLASTY     Gram Stain       Value: RARE WBC PRESENT, PREDOMINANTLY MONONUCLEAR     NO SQUAMOUS EPITHELIAL CELLS SEEN     NO ORGANISMS SEEN   Culture NO GROWTH 2 DAYS     Report Status 08/06/2012 FINAL    GLUCOSE, CAPILLARY      Result Value Range   Glucose-Capillary 136 (*) 70 - 99 mg/dL  APTT      Result Value Range   aPTT 30  24 - 37 seconds  PROTIME-INR      Result Value Range   Prothrombin Time 12.1  11.6 - 15.2 seconds   INR 0.91  0.00 - 1.49  GLUCOSE, CAPILLARY      Result Value Range   Glucose-Capillary 252 (*) 70 - 99 mg/dL  PROTIME-INR      Result Value Range   Prothrombin Time 13.7  11.6 - 15.2 seconds   INR 1.07  0.00 - 1.49  CBC  Result Value Range   WBC 9.8  4.0 - 10.5 K/uL   RBC 2.81 (*) 3.87 - 5.11 MIL/uL   Hemoglobin 7.5 (*) 12.0 - 15.0 g/dL   HCT 82.9 (*) 56.2 - 13.0 %   MCV 81.9  78.0 - 100.0 fL   MCH 26.7  26.0 - 34.0 pg   MCHC 32.6  30.0 - 36.0 g/dL   RDW 86.5  78.4 - 69.6 %   Platelets 190  150 - 400 K/uL  BASIC METABOLIC PANEL      Result Value Range   Sodium 138  135 - 145 mEq/L   Potassium 4.1  3.5 - 5.1 mEq/L   Chloride 101  96 - 112 mEq/L   CO2 29  19 - 32 mEq/L   Glucose, Bld 176 (*) 70 - 99 mg/dL   BUN 18  6 - 23 mg/dL   Creatinine, Ser 2.95  0.50 - 1.10 mg/dL   Calcium 8.5  8.4 - 28.4 mg/dL   GFR calc non Af Amer >90  >90 mL/min   GFR calc Af Amer >90  >90 mL/min  GLUCOSE, CAPILLARY      Result Value Range   Glucose-Capillary 301 (*) 70 - 99 mg/dL   Comment 1 Documented in Chart     Comment 2 Notify RN    GLUCOSE, CAPILLARY      Result Value Range   Glucose-Capillary 237 (*) 70 - 99 mg/dL  GLUCOSE, CAPILLARY      Result Value Range   Glucose-Capillary 150 (*) 70 - 99 mg/dL  GLUCOSE, CAPILLARY      Result Value Range   Glucose-Capillary 161 (*) 70 - 99  mg/dL   Comment 1 Notify RN    PROTIME-INR      Result Value Range   Prothrombin Time 17.2 (*) 11.6 - 15.2 seconds   INR 1.44  0.00 - 1.49  CBC      Result Value Range   WBC 8.2  4.0 - 10.5 K/uL   RBC 2.56 (*) 3.87 - 5.11 MIL/uL   Hemoglobin 6.8 (*) 12.0 - 15.0 g/dL   HCT 13.2 (*) 44.0 - 10.2 %   MCV 82.0  78.0 - 100.0 fL   MCH 26.6  26.0 - 34.0 pg   MCHC 32.4  30.0 - 36.0 g/dL   RDW 72.5  36.6 - 44.0 %   Platelets 178  150 - 400 K/uL  GLUCOSE, CAPILLARY      Result Value Range   Glucose-Capillary 251 (*) 70 - 99 mg/dL  GLUCOSE, CAPILLARY      Result Value Range   Glucose-Capillary 100 (*) 70 - 99 mg/dL  GLUCOSE, CAPILLARY      Result Value Range   Glucose-Capillary 197 (*) 70 - 99 mg/dL  GLUCOSE, CAPILLARY      Result Value Range   Glucose-Capillary 161 (*) 70 - 99 mg/dL   Comment 1 Notify RN    PROTIME-INR      Result Value Range   Prothrombin Time 21.1 (*) 11.6 - 15.2 seconds   INR 1.89 (*) 0.00 - 1.49  CBC      Result Value Range   WBC 10.0  4.0 - 10.5 K/uL   RBC 2.96 (*) 3.87 - 5.11 MIL/uL   Hemoglobin 8.0 (*) 12.0 - 15.0 g/dL   HCT 34.7 (*) 42.5 - 95.6 %   MCV 84.1  78.0 - 100.0 fL   MCH 27.0  26.0 - 34.0 pg   MCHC 32.1  30.0 - 36.0 g/dL   RDW 16.1  09.6 - 04.5 %   Platelets 193  150 - 400 K/uL  GLUCOSE, CAPILLARY      Result Value Range   Glucose-Capillary 171 (*) 70 - 99 mg/dL   Comment 1 Notify RN    GLUCOSE, CAPILLARY      Result Value Range   Glucose-Capillary 107 (*) 70 - 99 mg/dL  GLUCOSE, CAPILLARY      Result Value Range   Glucose-Capillary 157 (*) 70 - 99 mg/dL  GLUCOSE, CAPILLARY      Result Value Range   Glucose-Capillary 127 (*) 70 - 99 mg/dL   Comment 1 Notify RN    PREPARE RBC (CROSSMATCH)      Result Value Range   Order Confirmation ORDER PROCESSED BY BLOOD BANK      Discharge Medications:     Medication List    STOP taking these medications       HYDROcodone-acetaminophen 5-325 MG per tablet  Commonly known as:   NORCO/VICODIN      TAKE these medications       aspirin EC 81 MG tablet  Take 81 mg by mouth daily.     DSS 100 MG Caps  Take 100 mg by mouth 2 (two) times daily.     glimepiride 4 MG tablet  Commonly known as:  AMARYL  Take 6 mg by mouth daily before breakfast.     ICY HOT EX  Apply 1 application topically at bedtime.     metFORMIN 500 MG 24 hr tablet  Commonly known as:  GLUCOPHAGE-XR  Take 1,000 mg by mouth 2 (two) times daily.     methocarbamol 500 MG tablet  Commonly known as:  ROBAXIN  Take 1 tablet (500 mg total) by mouth every 8 (eight) hours as needed.     Oxycodone HCl 10 MG Tabs  Take 1 tablet (10 mg total) by mouth every 4 (four) hours as needed.     simvastatin 40 MG tablet  Commonly known as:  ZOCOR  Take 40 mg by mouth at bedtime.     traMADol 50 MG tablet  Commonly known as:  ULTRAM  Take 50 mg by mouth every 6 (six) hours as needed for pain.     warfarin 5 MG tablet  Commonly known as:  COUMADIN  Take 1 tablet (5 mg total) by mouth daily.        Diagnostic Studies: Dg Chest 2 View  07/28/2012   *RADIOLOGY REPORT*  Clinical Data: Preoperative evaluation for knee revision, history diabetes, hyperlipidemia  CHEST - 2 VIEW  Comparison: None available  Findings: Upper-normal size of cardiac silhouette. Minimal elongation thoracic aorta. Pulmonary vascularity normal. Peribronchial thickening without infiltrate, pleural effusion or pneumothorax. Scattered endplate spur formation thoracic spine.  IMPRESSION: Mild bronchitic changes.   Original Report Authenticated By: Ulyses Southward, M.D.   Dg Knee 1-2 Views Left  08/05/2012   *RADIOLOGY REPORT*  Clinical Data: Postop redo left total knee replacement  LEFT KNEE - 1-2 VIEW  Comparison: 06/23/2012  Findings:  The patient has undergone a redo left total knee replacement with extended femoral and tibial components.  Postoperative osseous fragment adjacent to the lateral tibial plateau.  No fracture or dislocation.   Intra-articular joint space is now widened.  Alignment appears otherwise anatomic.  There are several small foci of expected postoperative intra-articular air. Multiple midline skin staples.  No radiopaque foreign body.  IMPRESSION: Post redo left total hip replacement without evidence  of complication.   Original Report Authenticated By: Tacey Ruiz, MD    Disposition: 06-Home-Health Care Svc      Discharge Orders   Future Orders Complete By Expires     Call MD / Call 911  As directed     Comments:      If you experience chest pain or shortness of breath, CALL 911 and be transported to the hospital emergency room.  If you develope a fever above 101 F, pus (white drainage) or increased drainage or redness at the wound, or calf pain, call your surgeon's office.    Constipation Prevention  As directed     Comments:      Drink plenty of fluids.  Prune juice may be helpful.  You may use a stool softener, such as Colace (over the counter) 100 mg twice a day.  Use MiraLax (over the counter) for constipation as needed.    Diet - low sodium heart healthy  As directed     Discharge instructions  As directed     Comments:      1. Weight bearing as tolerated with crutches 2. CPM 6 hours per day 3.Keep incision dry    Increase activity slowly as tolerated  As directed           Signed: Tayvian Holycross SCOTT 08/14/2012, 3:44 PM

## 2012-08-31 ENCOUNTER — Other Ambulatory Visit: Payer: Self-pay | Admitting: Internal Medicine

## 2012-08-31 DIAGNOSIS — Z1231 Encounter for screening mammogram for malignant neoplasm of breast: Secondary | ICD-10-CM

## 2012-09-07 ENCOUNTER — Ambulatory Visit
Admission: RE | Admit: 2012-09-07 | Discharge: 2012-09-07 | Disposition: A | Discharge status: Home / Self Care | Payer: BC Managed Care – PPO | Source: Ambulatory Visit | Attending: Internal Medicine | Admitting: Internal Medicine

## 2012-09-07 DIAGNOSIS — Z1231 Encounter for screening mammogram for malignant neoplasm of breast: Secondary | ICD-10-CM

## 2012-09-07 IMAGING — MG MM SCREEN MAMMOGRAM BILATERAL
3 series · 3 of 3 positions shown · non-contrast
Comparison: Previous exam(s).

CLINICAL DATA: Screening.

DIGITAL SCREENING BILATERAL MAMMOGRAM WITH CAD

[R CC]
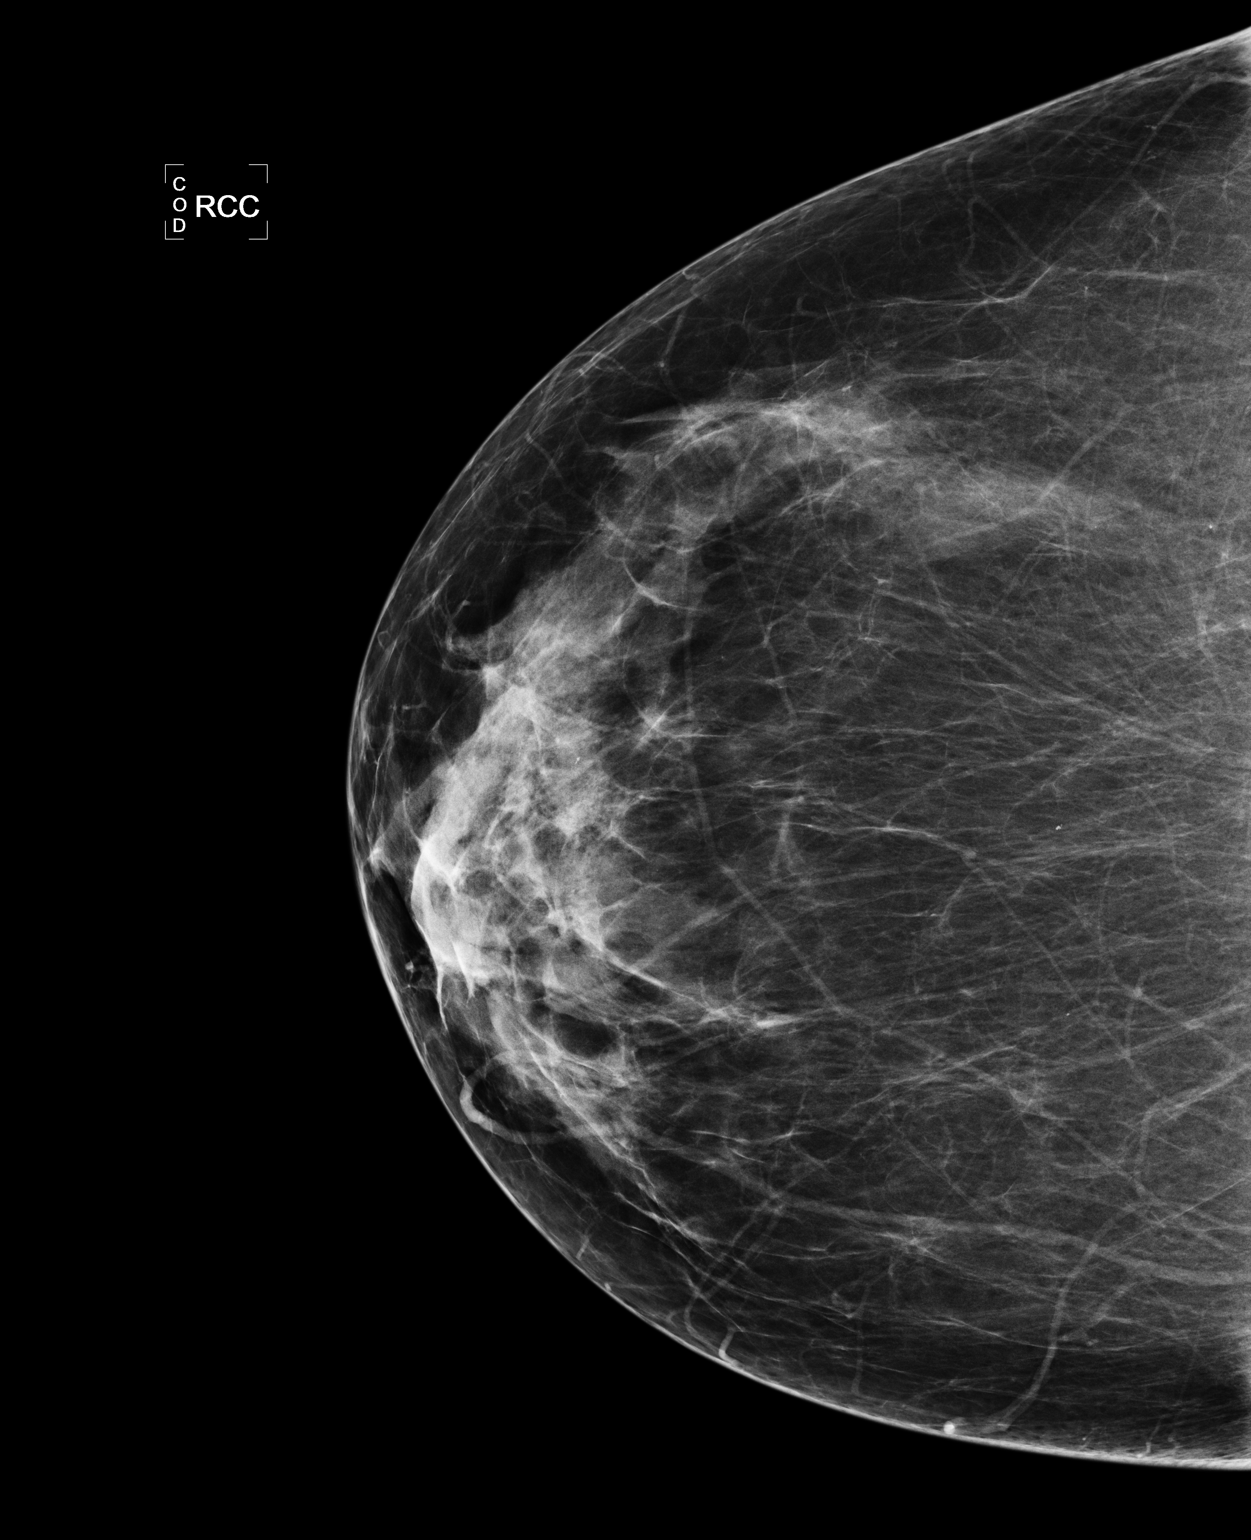

[L CC]
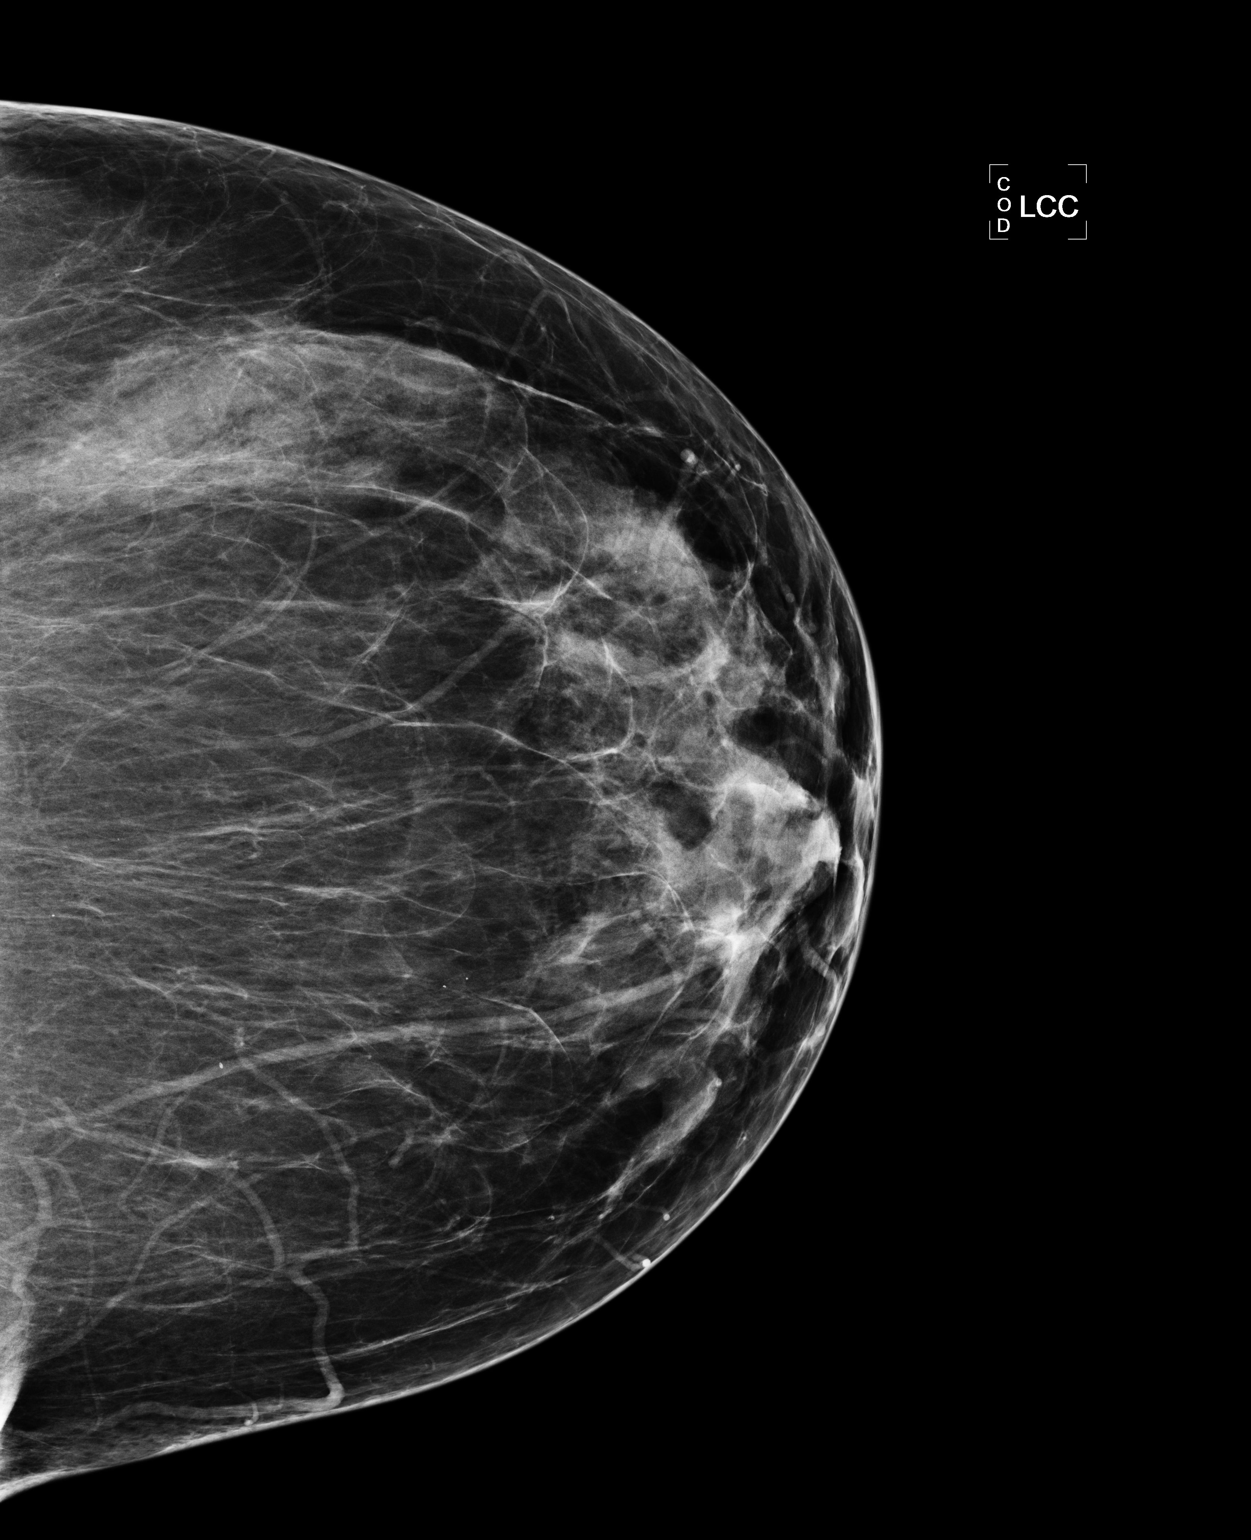

[R MLO]
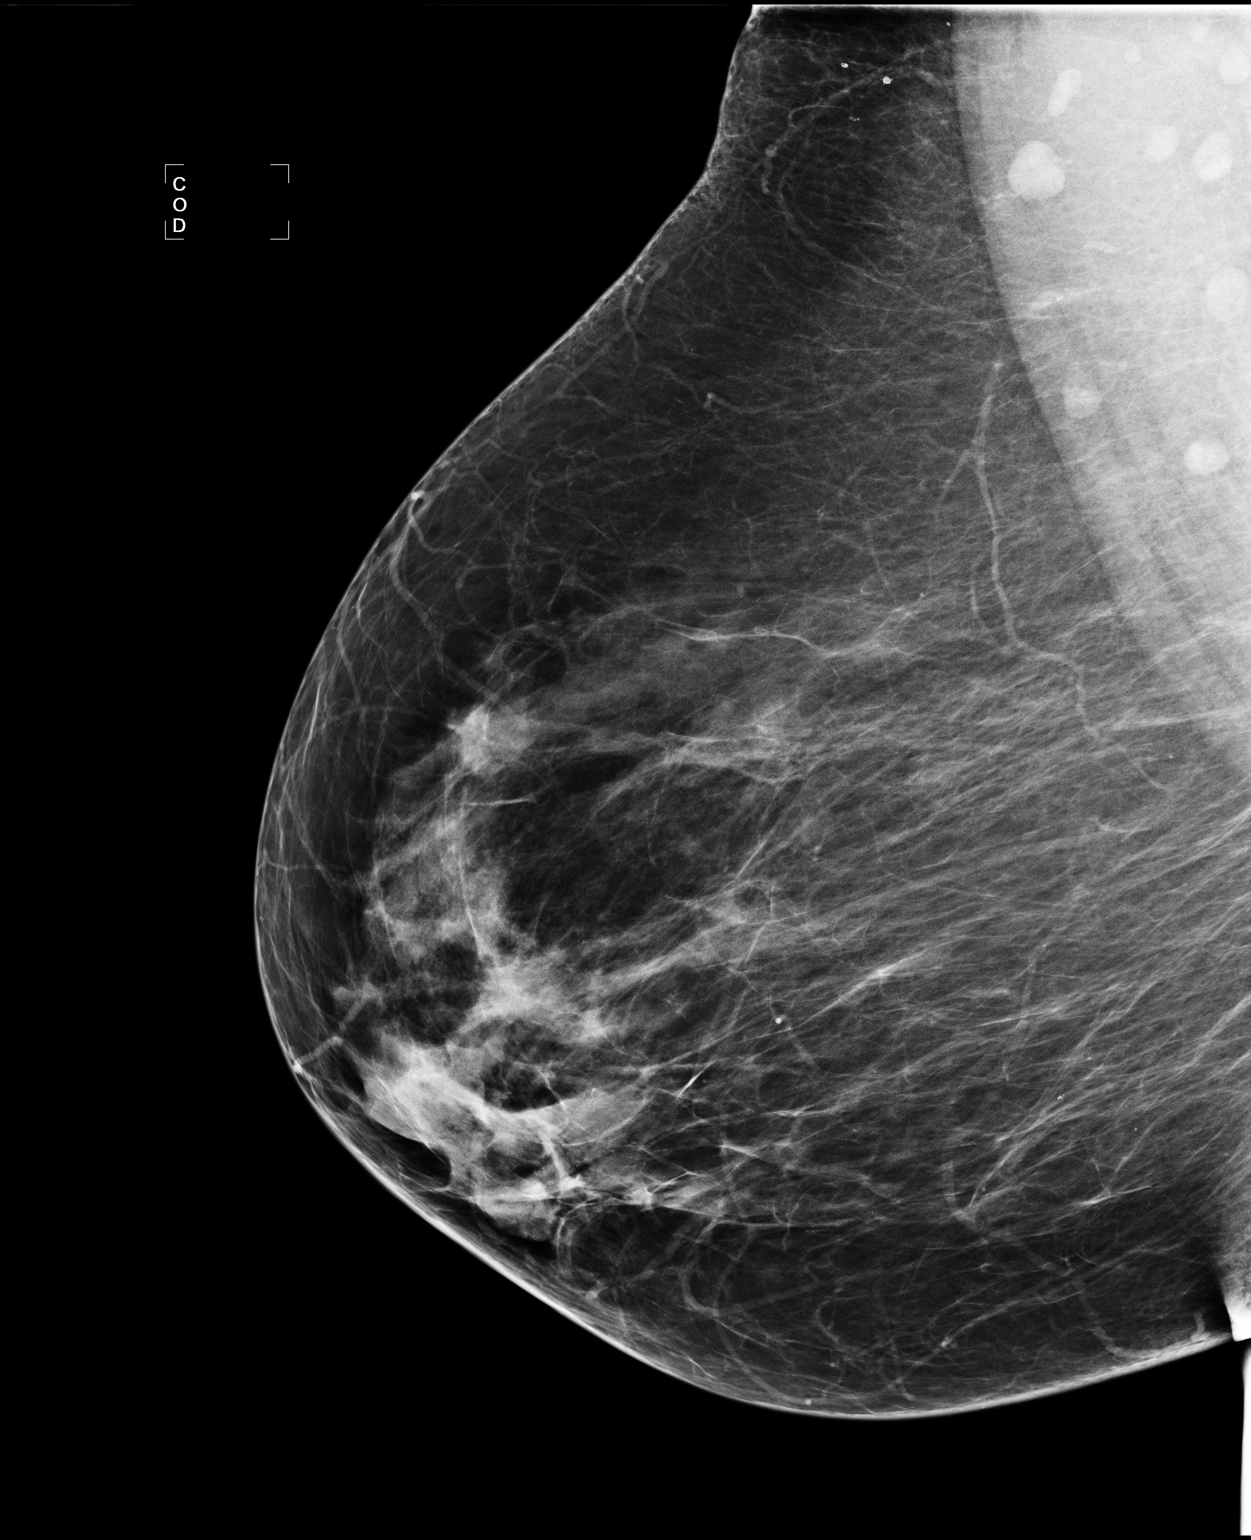

[3 of 3 positions shown; findings below may reference images not displayed]

FINDINGS: ACR Breast Density Category b:  There are scattered areas of
fibroglandular density.

There are no findings suspicious for malignancy.

Images were processed with CAD.
IMPRESSION: No mammographic evidence of malignancy.

A result letter of this screening mammogram will be mailed directly
to the patient.

RECOMMENDATION:
Screening mammogram in one year. (Code:[C3])

BI-RADS CATEGORY 1:  Negative.

## 2012-11-26 ENCOUNTER — Other Ambulatory Visit: Payer: Self-pay

## 2013-06-03 ENCOUNTER — Other Ambulatory Visit: Payer: Self-pay

## 2013-06-04 ENCOUNTER — Other Ambulatory Visit: Payer: Self-pay

## 2013-06-04 DIAGNOSIS — Z1231 Encounter for screening mammogram for malignant neoplasm of breast: Secondary | ICD-10-CM

## 2013-09-08 ENCOUNTER — Ambulatory Visit: Payer: BC Managed Care – PPO

## 2013-09-08 ENCOUNTER — Ambulatory Visit
Admission: RE | Admit: 2013-09-08 | Discharge: 2013-09-08 | Disposition: A | Discharge status: Home / Self Care | Payer: BC Managed Care – PPO | Source: Ambulatory Visit

## 2013-09-08 DIAGNOSIS — Z1231 Encounter for screening mammogram for malignant neoplasm of breast: Secondary | ICD-10-CM

## 2013-09-08 IMAGING — MG MM SCREEN MAMMOGRAM BILATERAL
6 series · 6 of 6 positions shown · non-contrast
Comparison: Previous exam(s).

CLINICAL DATA: Screening.

EXAM:
DIGITAL SCREENING BILATERAL MAMMOGRAM WITH CAD

[R CC (1 of 2)]
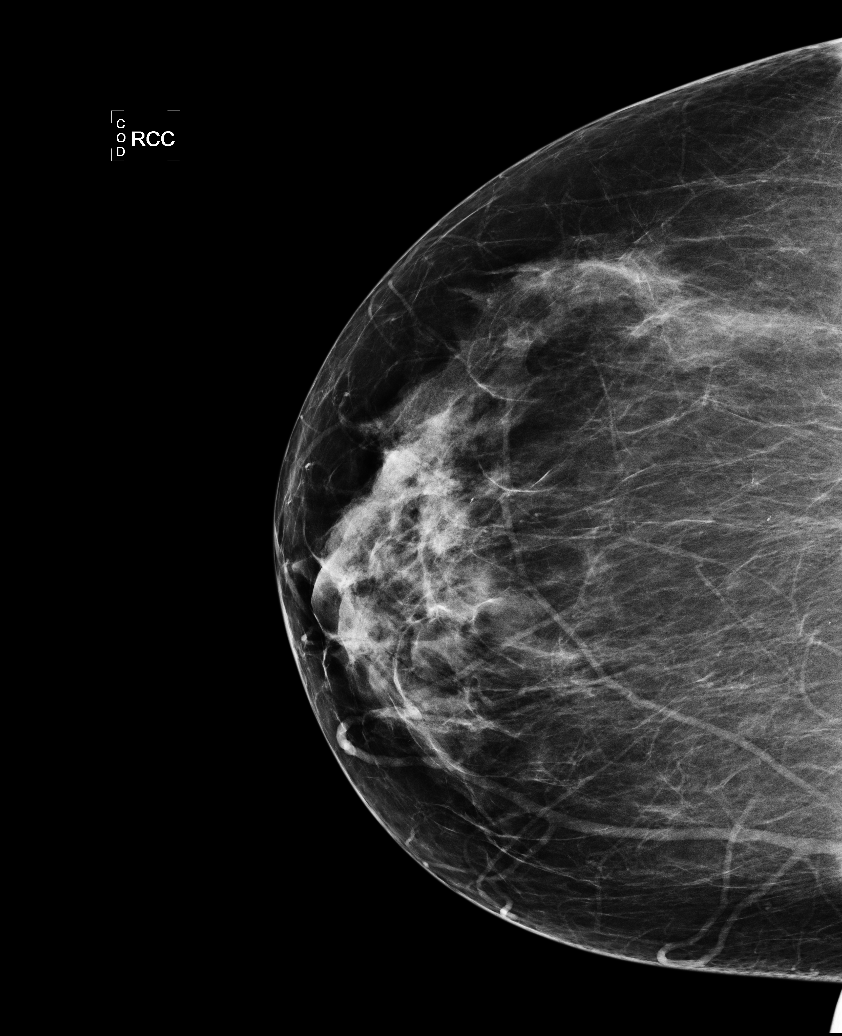

[L CC (1 of 2)]
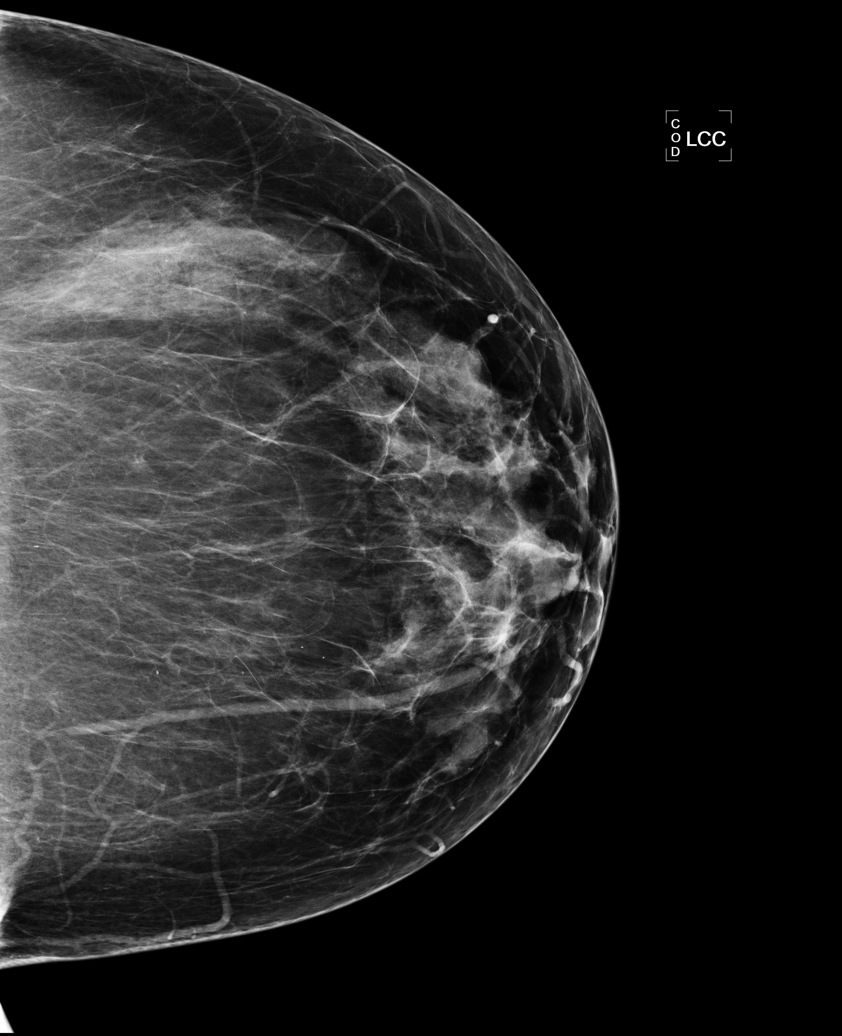

[L MLO]
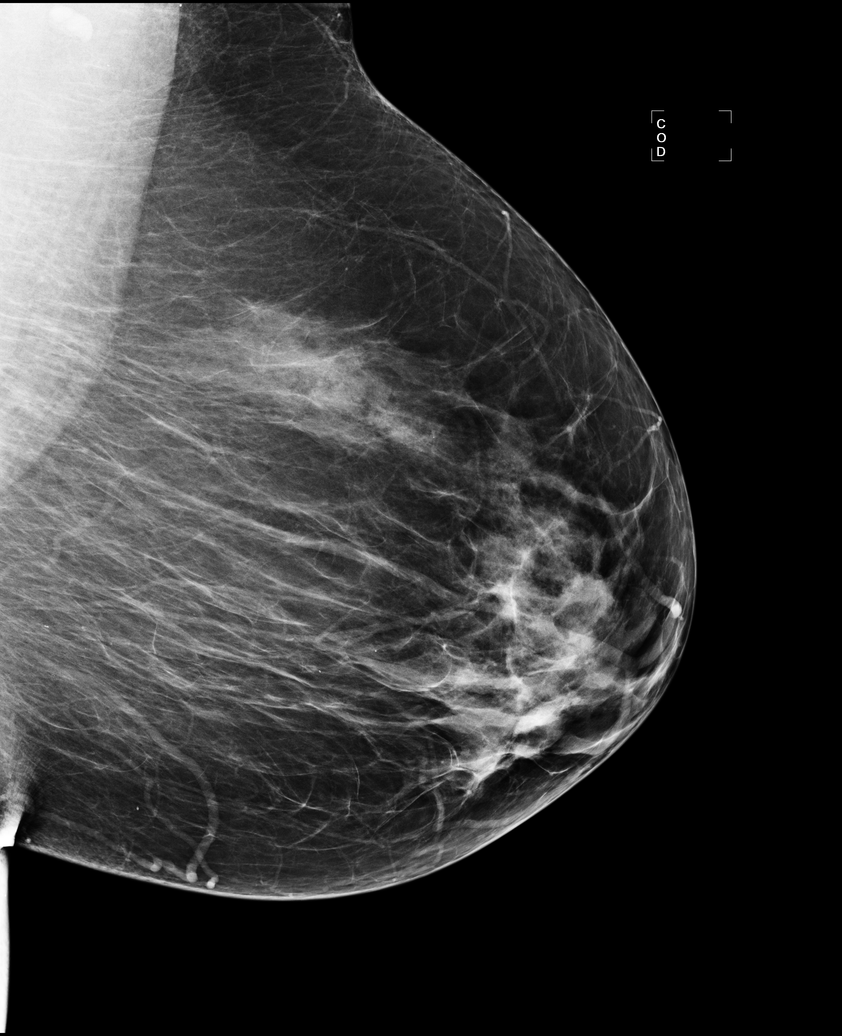

[R MLO]
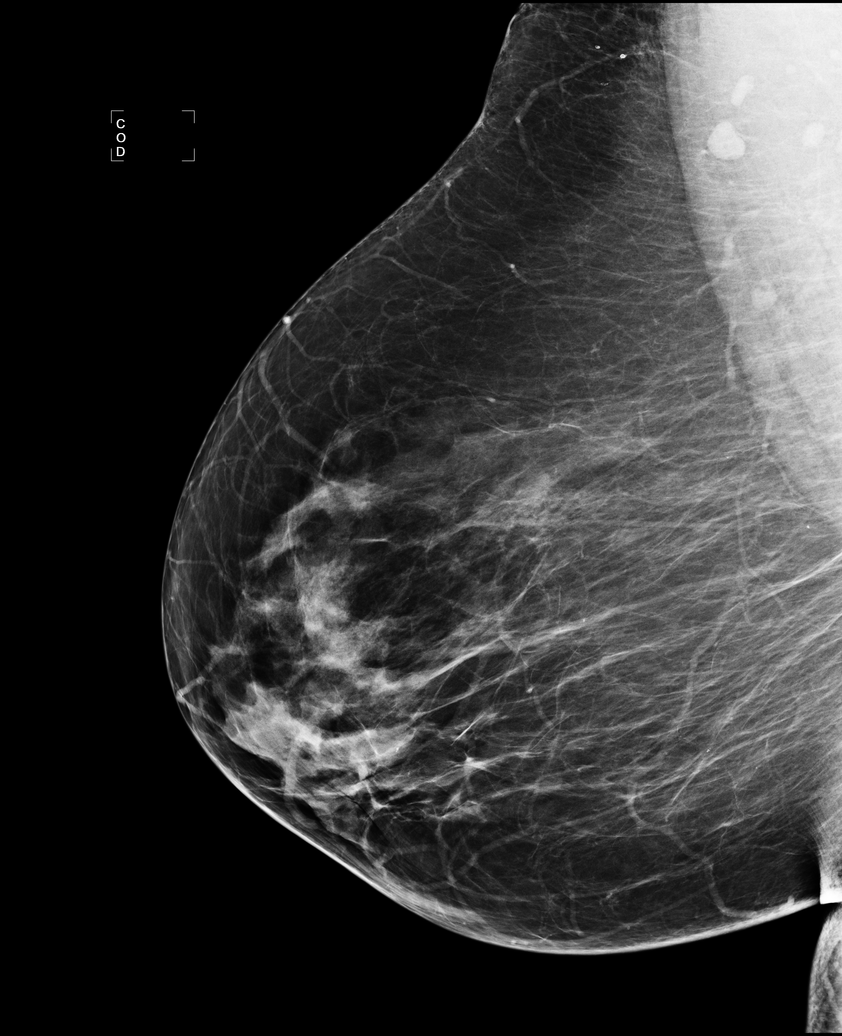

[L CC (2 of 2)]
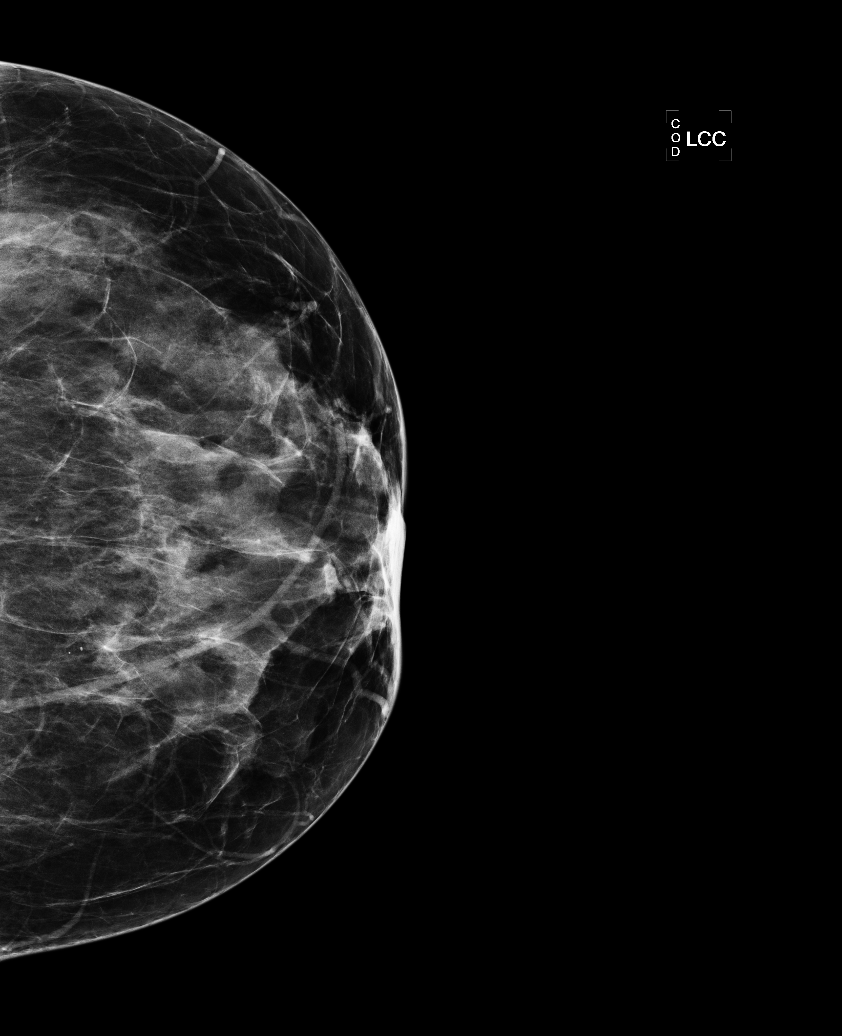

[R CC (2 of 2)]
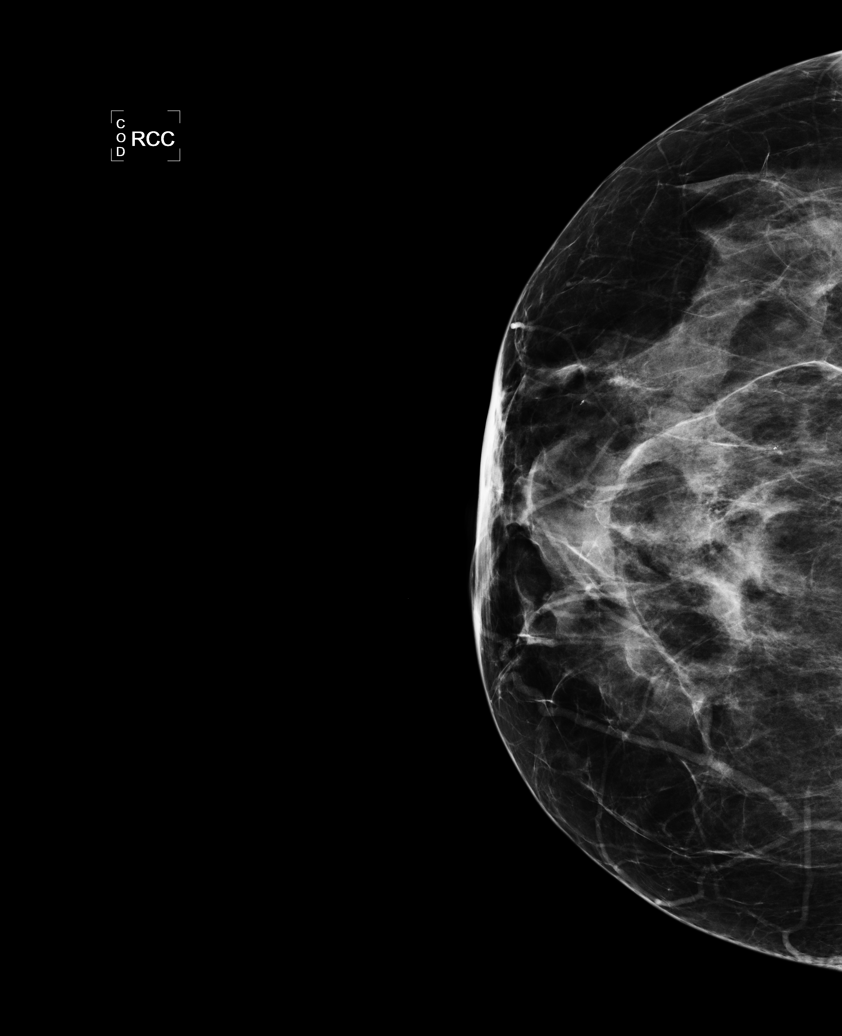

[6 of 6 positions shown; findings below may reference images not displayed]

ACR Breast Density Category c: The breast tissue is heterogeneously
dense, which may obscure small masses.
FINDINGS: There are no findings suspicious for malignancy. Images were
processed with CAD.
IMPRESSION: No mammographic evidence of malignancy. A result letter of this
screening mammogram will be mailed directly to the patient.

RECOMMENDATION:
Screening mammogram in one year. (Code:[0J])

BI-RADS CATEGORY  1: Negative.

## 2013-12-03 ENCOUNTER — Ambulatory Visit (INDEPENDENT_AMBULATORY_CARE_PROVIDER_SITE_OTHER): Payer: BC Managed Care – PPO | Admitting: Podiatrist

## 2013-12-03 ENCOUNTER — Encounter: Payer: Self-pay | Admitting: Podiatrist

## 2013-12-03 VITALS — BP 136/80 | HR 94 | Resp 16

## 2013-12-03 DIAGNOSIS — E1140 Type 2 diabetes mellitus with diabetic neuropathy, unspecified: Secondary | ICD-10-CM

## 2013-12-03 DIAGNOSIS — L84 Corns and callosities: Secondary | ICD-10-CM

## 2013-12-03 DIAGNOSIS — Q828 Other specified congenital malformations of skin: Secondary | ICD-10-CM

## 2013-12-03 NOTE — Progress Notes (Signed)
   Subjective:    Patient ID: Brittany Klein, female    DOB: 12/26/1955, 58 y.o.   MRN: [id number]  HPI Comments: "I have these calluses that I need trimmed"  Patient c/o thick calluses plantar forefoot and tips of toes for several years. She tries to keep them trimmed but says she is told not to because she is diabetic. Says they are real painful the thicker they get.     Review of Systems  Cardiovascular: Positive for palpitations.  Musculoskeletal: Positive for arthralgias.  Hematological: Bruises/bleeds easily.  All other systems reviewed and are negative.      Objective:   Physical Exam Neurovascular status is intact. Pulses are palpable at 1/4 dp and pt bilateral. Neurological sensation decreased to the distal digits.  SWMF intact at 3/5 sited.  Calluses present submet 3 and hallux bilateral.  Intact integument is present post debridement.  No sign of ulceration post debridement is noted.  Mild contracture of digits is presnt bilateral with excess pressure to high pressure point areas.       Assessment & Plan:  Diabetes, neuropathy, calluses  Plan:  Debridement of calluses carried out today without complication.  She will be seen back for routine care or call when the calluses need further debridement.  If she notices any redness, swelling or signs of infection, she will call.

## 2013-12-03 NOTE — Patient Instructions (Signed)
Diabetes and Foot Care Diabetes may cause you to have problems because of poor blood supply (circulation) to your feet and legs. This may cause the skin on your feet to become thinner, break easier, and heal more slowly. Your skin may become dry, and the skin may peel and crack. You may also have nerve damage in your legs and feet causing decreased feeling in them. You may not notice minor injuries to your feet that could lead to infections or more serious problems. Taking care of your feet is one of the most important things you can do for yourself.  HOME CARE INSTRUCTIONS  Wear shoes at all times, even in the house. Do not go barefoot. Bare feet are easily injured.  Check your feet daily for blisters, cuts, and redness. If you cannot see the bottom of your feet, use a mirror or ask someone for help.  Wash your feet with warm water (do not use hot water) and mild soap. Then pat your feet and the areas between your toes until they are completely dry. Do not soak your feet as this can dry your skin.  Apply a moisturizing lotion or petroleum jelly (that does not contain alcohol and is unscented) to the skin on your feet and to dry, brittle toenails. Do not apply lotion between your toes.  Trim your toenails straight across. Do not dig under them or around the cuticle. File the edges of your nails with an emery board or nail file.  Do not cut corns or calluses or try to remove them with medicine.  Wear clean socks or stockings every day. Make sure they are not too tight. Do not wear knee-high stockings since they may decrease blood flow to your legs.  Wear shoes that fit properly and have enough cushioning. To break in new shoes, wear them for just a few hours a day. This prevents you from injuring your feet. Always look in your shoes before you put them on to be sure there are no objects inside.  Do not cross your legs. This may decrease the blood flow to your feet.  If you find a minor scrape,  cut, or break in the skin on your feet, keep it and the skin around it clean and dry. These areas may be cleansed with mild soap and water. Do not cleanse the area with peroxide, alcohol, or iodine.  When you remove an adhesive bandage, be sure not to damage the skin around it.  If you have a wound, look at it several times a day to make sure it is healing.  Do not use heating pads or hot water bottles. They may burn your skin. If you have lost feeling in your feet or legs, you may not know it is happening until it is too late.  Make sure your health care provider performs a complete foot exam at least annually or more often if you have foot problems. Report any cuts, sores, or bruises to your health care provider immediately. SEEK MEDICAL CARE IF:   You have an injury that is not healing.  You have cuts or breaks in the skin.  You have an ingrown nail.  You notice redness on your legs or feet.  You feel burning or tingling in your legs or feet.  You have pain or cramps in your legs and feet.  Your legs or feet are numb.  Your feet always feel cold. SEEK IMMEDIATE MEDICAL CARE IF:   There is increasing redness,   swelling, or pain in or around a wound.  There is a red line that goes up your leg.  Pus is coming from a wound.  You develop a fever or as directed by your health care provider.  You notice a bad smell coming from an ulcer or wound. Document Released: 01/05/2000 Document Revised: 09/09/2012 Document Reviewed: 06/16/2012 Exit[employer] Patient Information 2015 [employer]. This information is not intended to replace advice given to you by your health care provider. Make sure you discuss any questions you have with your health care provider.  

## 2014-06-16 ENCOUNTER — Ambulatory Visit (HOSPITAL_COMMUNITY)
Admission: RE | Admit: 2014-06-16 | Discharge: 2014-06-16 | Disposition: A | Discharge status: Home / Self Care | Payer: BC Managed Care – PPO | Source: Ambulatory Visit | Attending: Orthopedic Surgery | Admitting: Orthopedic Surgery

## 2014-06-16 ENCOUNTER — Other Ambulatory Visit (HOSPITAL_COMMUNITY): Payer: Self-pay | Admitting: Orthopedic Surgery

## 2014-06-16 DIAGNOSIS — M79662 Pain in left lower leg: Secondary | ICD-10-CM

## 2014-06-16 DIAGNOSIS — M79605 Pain in left leg: Secondary | ICD-10-CM

## 2014-06-16 DIAGNOSIS — M7989 Other specified soft tissue disorders: Principal | ICD-10-CM

## 2014-06-16 NOTE — Progress Notes (Signed)
Left Lower Ext. Completed. Negative for deep and superficial vein thrombosis in the left lower extremity. Brittany Klein, BS, RDMS, RVT

## 2014-07-20 ENCOUNTER — Other Ambulatory Visit: Payer: Self-pay

## 2014-07-20 DIAGNOSIS — Z1231 Encounter for screening mammogram for malignant neoplasm of breast: Secondary | ICD-10-CM

## 2014-09-12 ENCOUNTER — Ambulatory Visit
Admission: RE | Admit: 2014-09-12 | Discharge: 2014-09-12 | Disposition: A | Discharge status: Home / Self Care | Payer: BC Managed Care – PPO | Source: Ambulatory Visit

## 2014-09-12 DIAGNOSIS — Z1231 Encounter for screening mammogram for malignant neoplasm of breast: Secondary | ICD-10-CM

## 2014-09-12 IMAGING — MG MM SCREEN MAMMOGRAM BILATERAL
4 series · 4 of 4 positions shown · non-contrast
Comparison: Previous exam(s).

CLINICAL DATA: Screening.

EXAM:
DIGITAL SCREENING BILATERAL MAMMOGRAM WITH CAD

[R CC]
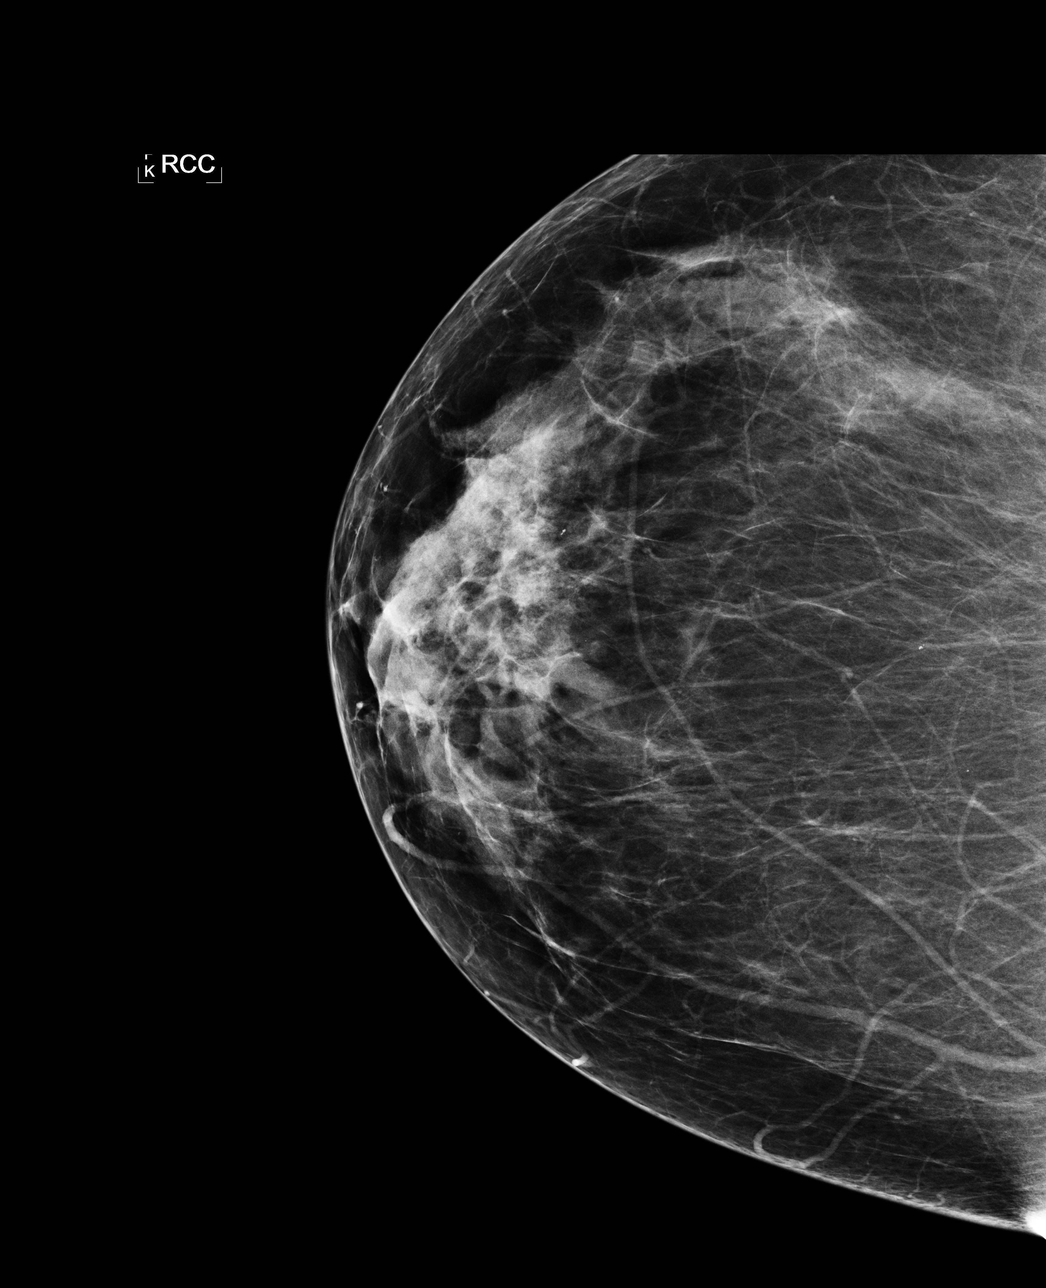

[L CC]
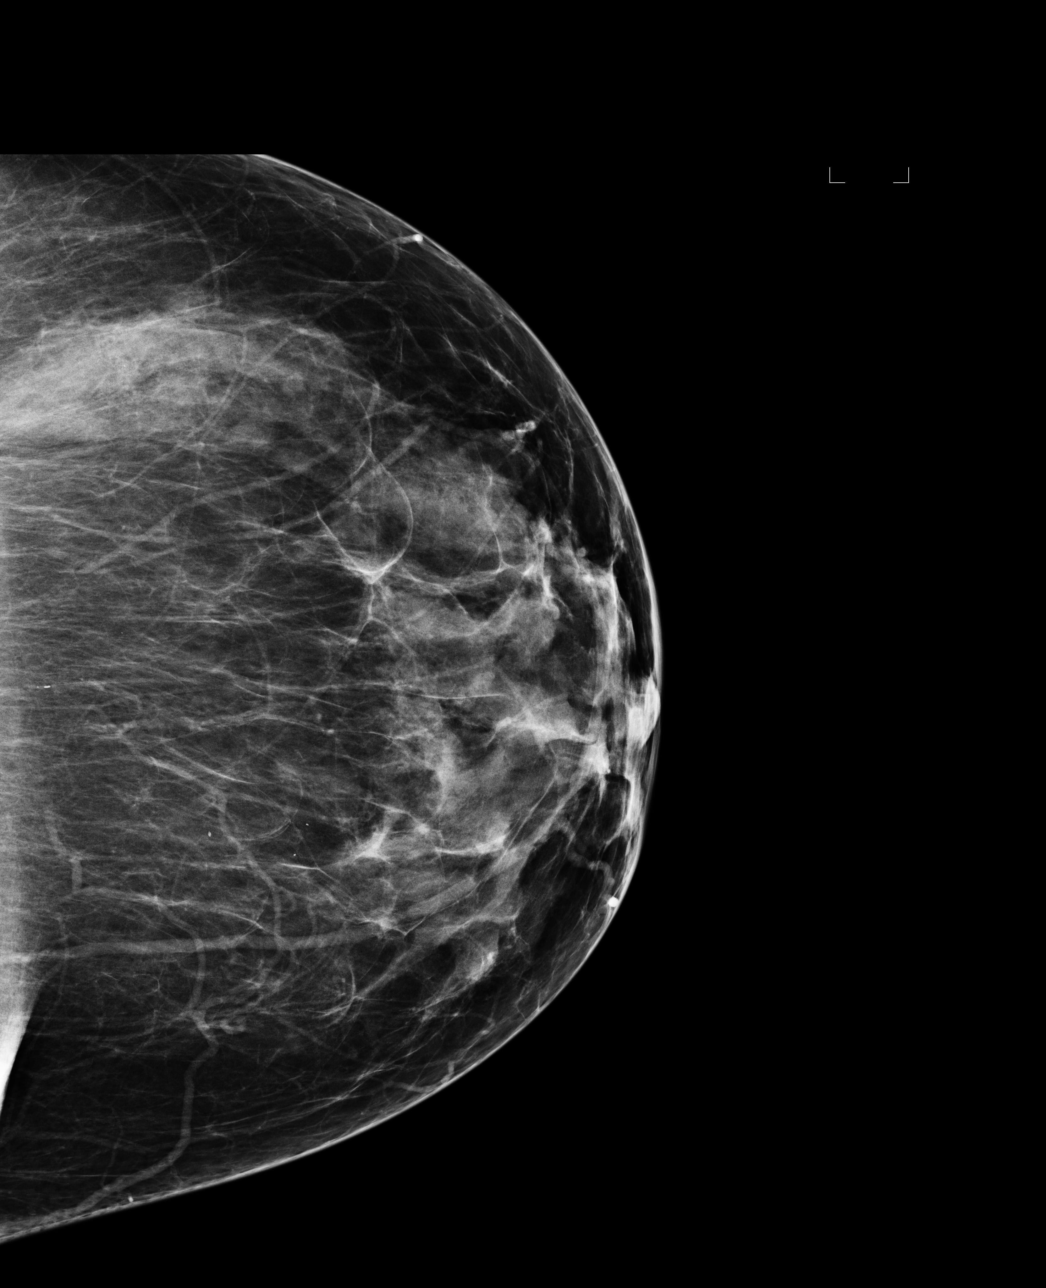

[L MLO]
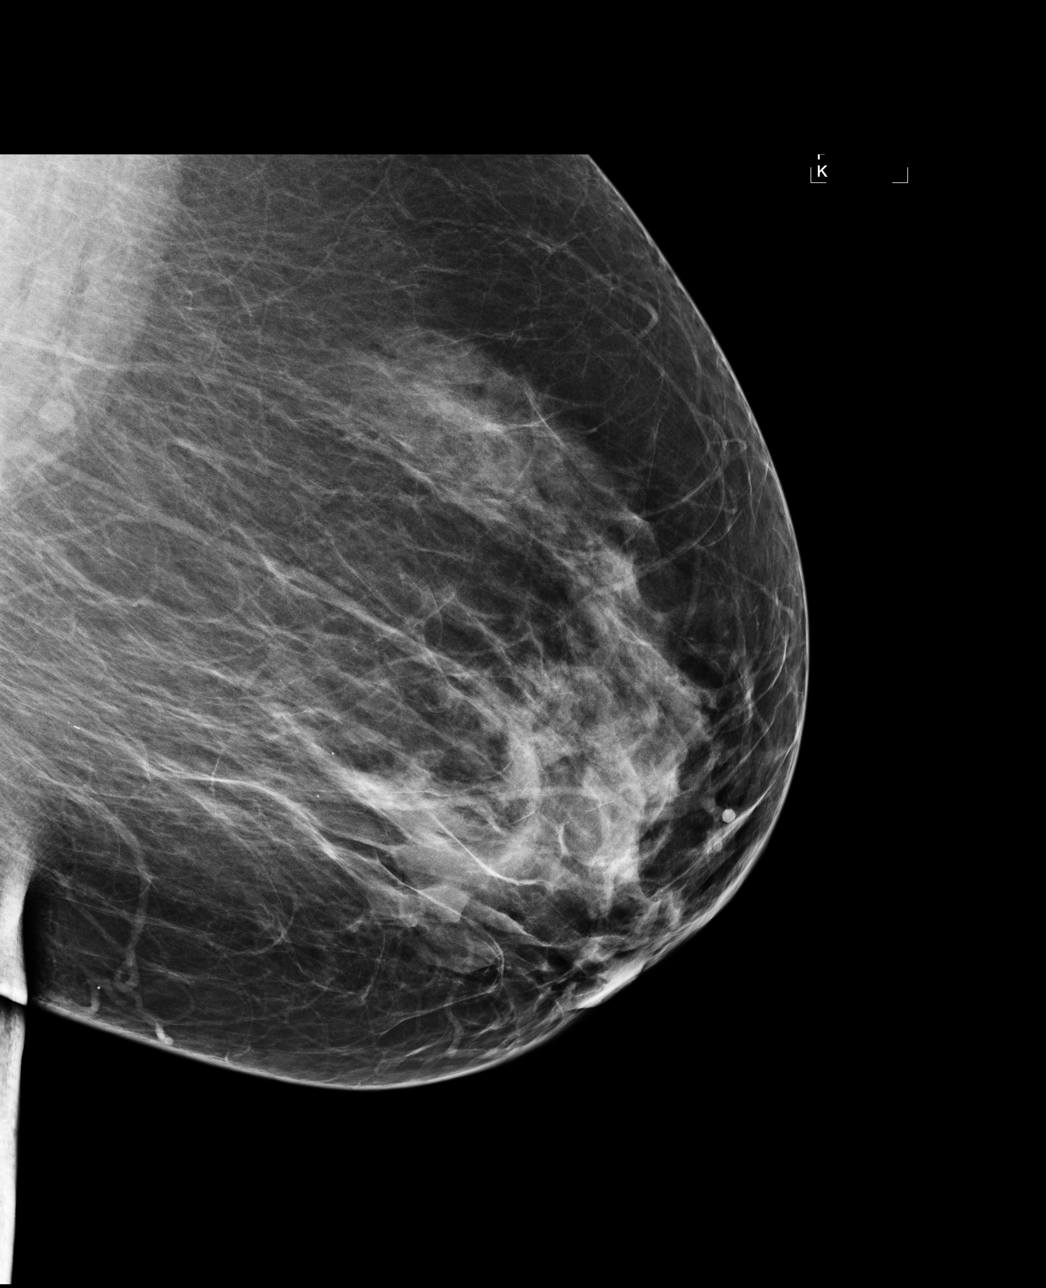

[R MLO]
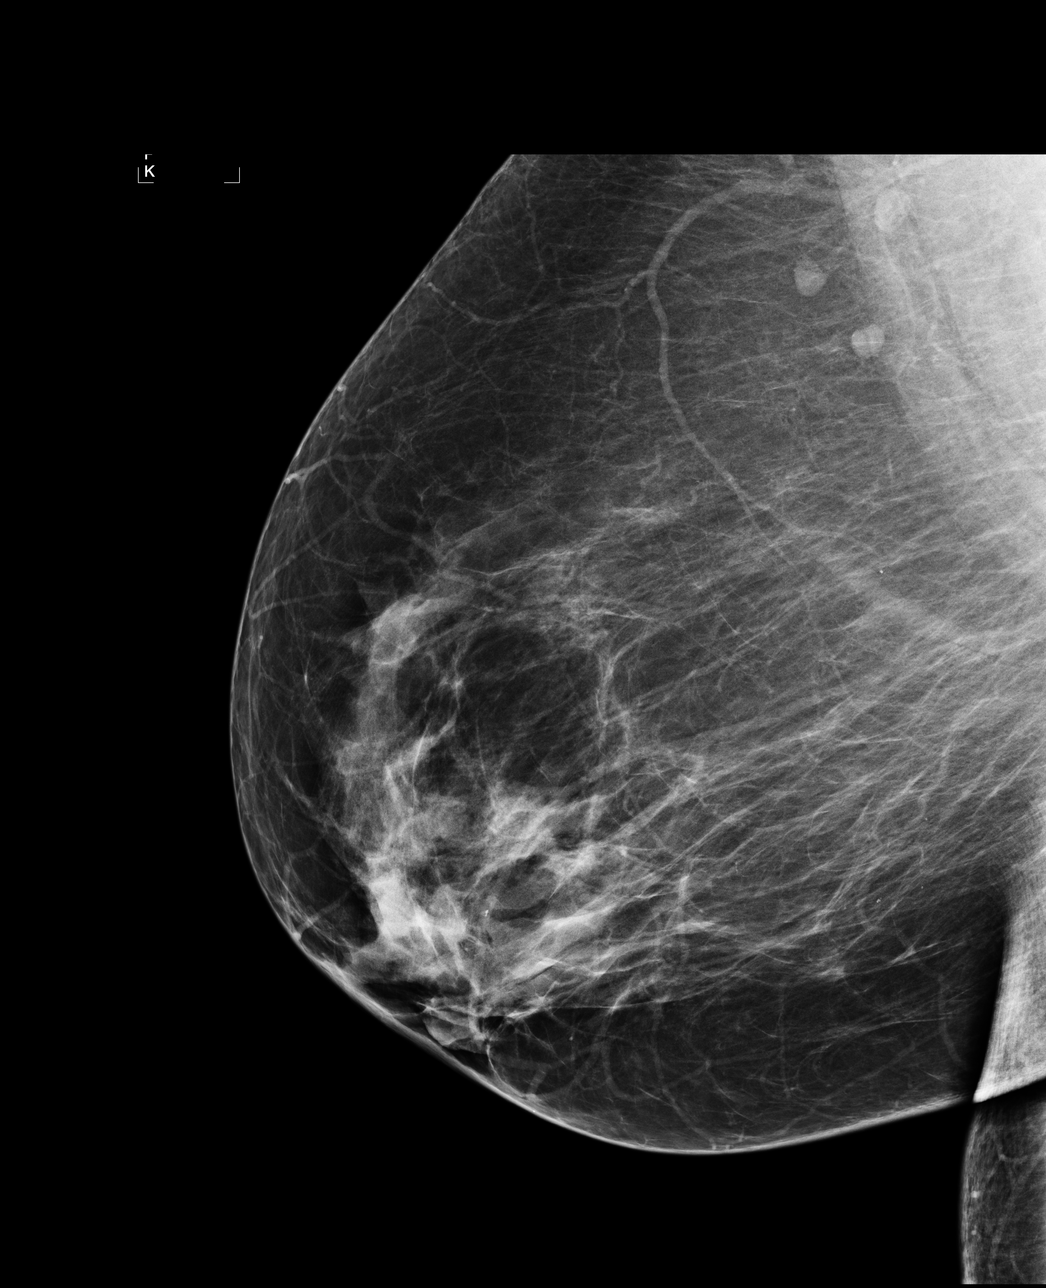

[4 of 4 positions shown; findings below may reference images not displayed]

ACR Breast Density Category b: There are scattered areas of
fibroglandular density.
FINDINGS: There are no findings suspicious for malignancy. Images were
processed with CAD.
IMPRESSION: No mammographic evidence of malignancy. A result letter of this
screening mammogram will be mailed directly to the patient.

RECOMMENDATION:
Screening mammogram in one year. (Code:[US])

BI-RADS CATEGORY  1: Negative.

## 2015-08-08 ENCOUNTER — Other Ambulatory Visit: Payer: Self-pay | Admitting: Nurse Practitioner

## 2015-08-08 DIAGNOSIS — Z1231 Encounter for screening mammogram for malignant neoplasm of breast: Secondary | ICD-10-CM

## 2015-09-13 ENCOUNTER — Ambulatory Visit: Payer: BC Managed Care – PPO

## 2015-09-18 ENCOUNTER — Ambulatory Visit
Admission: RE | Admit: 2015-09-18 | Discharge: 2015-09-18 | Disposition: A | Discharge status: Home / Self Care | Payer: BC Managed Care – PPO | Source: Ambulatory Visit | Attending: Internal Medicine | Admitting: Internal Medicine

## 2015-09-18 DIAGNOSIS — Z1231 Encounter for screening mammogram for malignant neoplasm of breast: Secondary | ICD-10-CM

## 2015-09-18 IMAGING — MG DIGITAL SCREENING BILATERAL MAMMOGRAM WITH CAD
6 series · 6 of 6 positions shown · non-contrast
Comparison: Previous exam(s).

CLINICAL DATA: Screening.

EXAM:
DIGITAL SCREENING BILATERAL MAMMOGRAM WITH CAD

[R CC (1 of 2)]
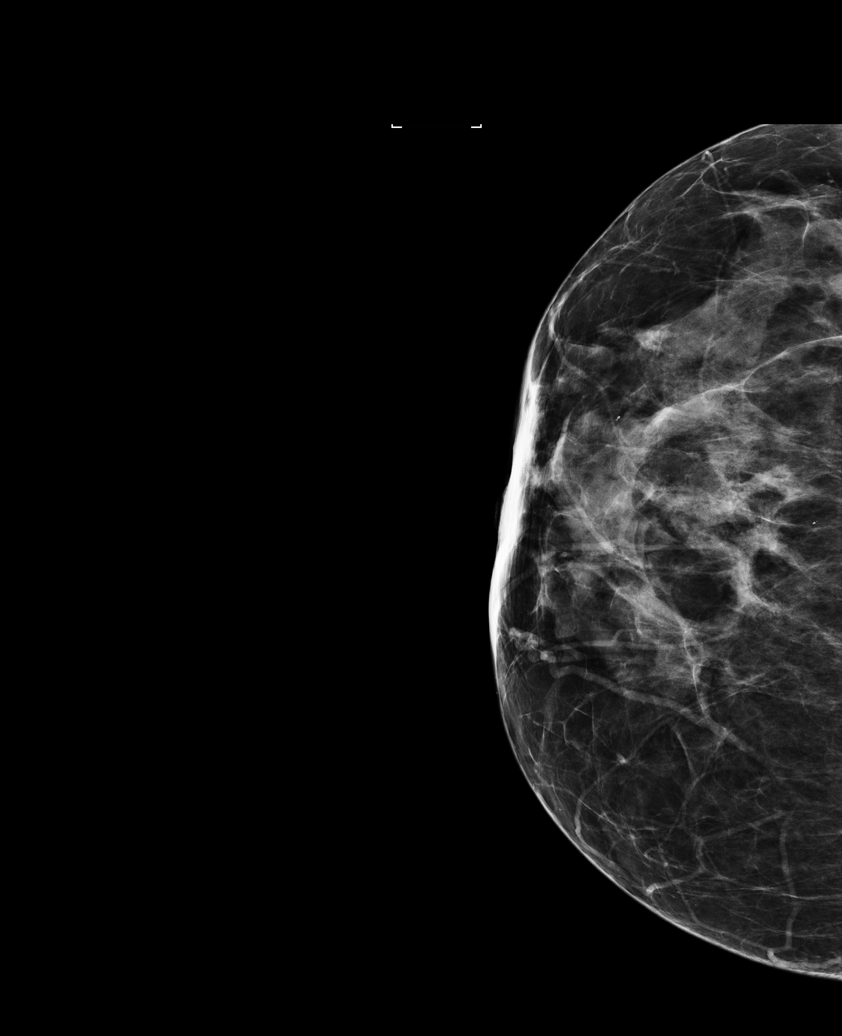

[R CC (2 of 2)]
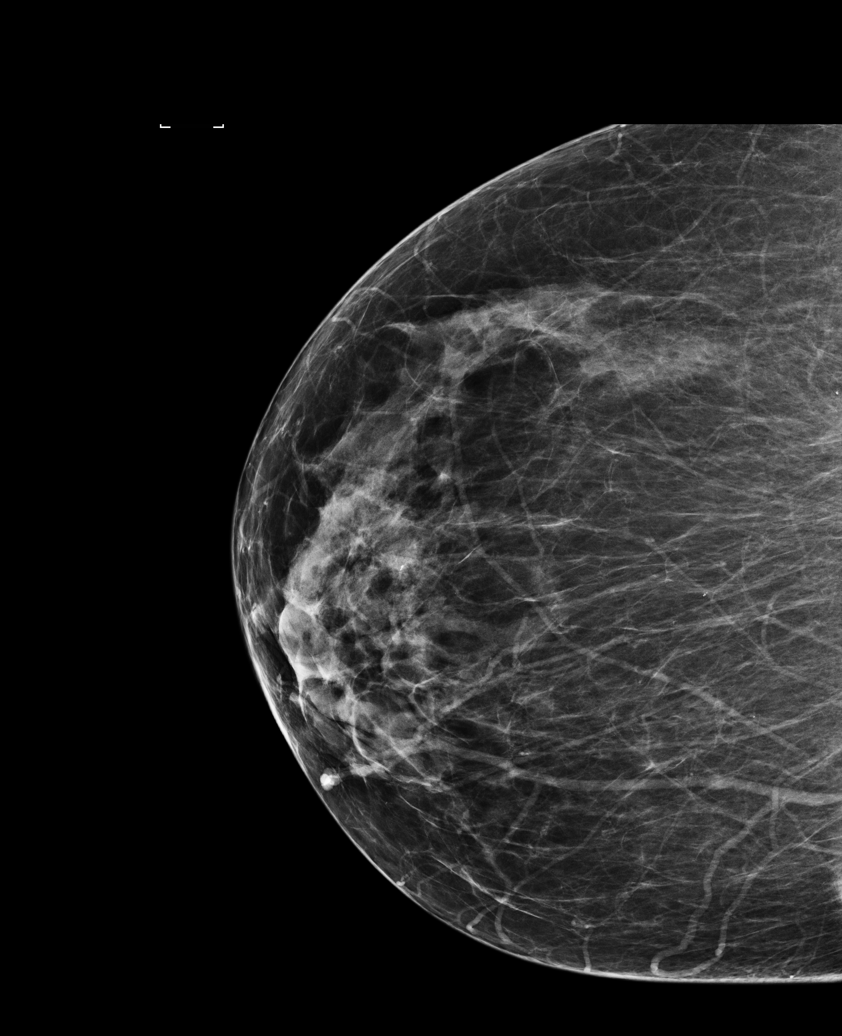

[L MLO]
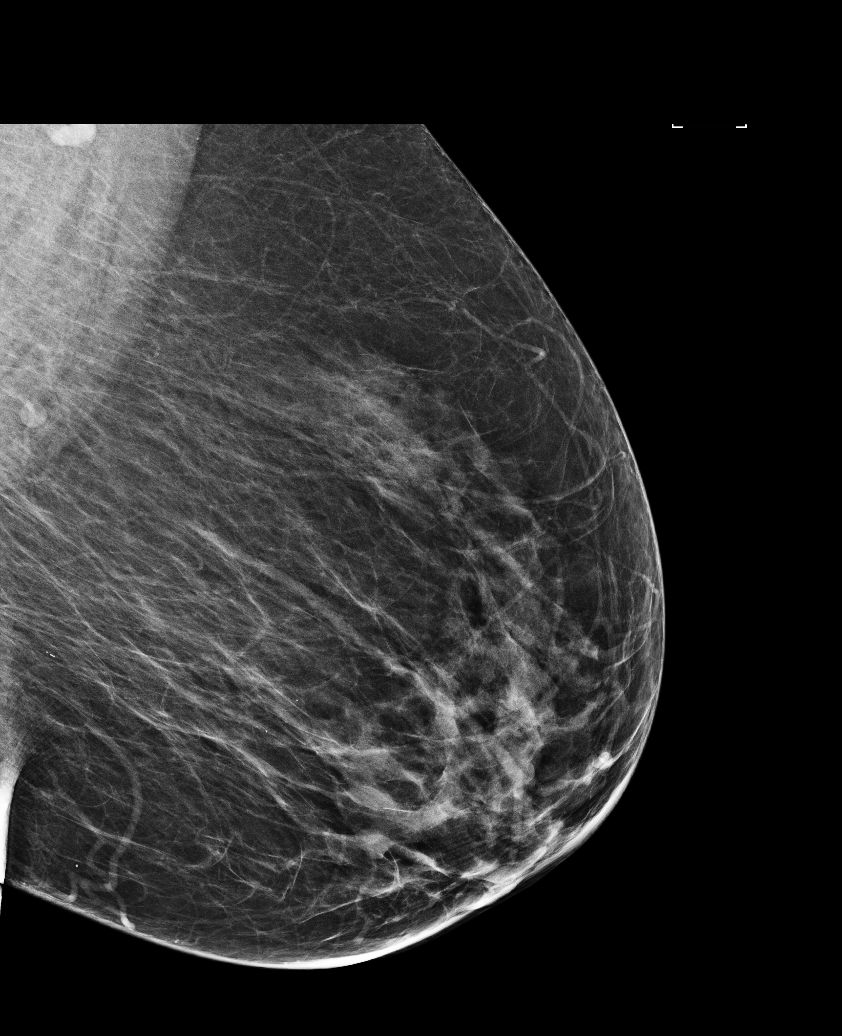

[L CC (1 of 2)]
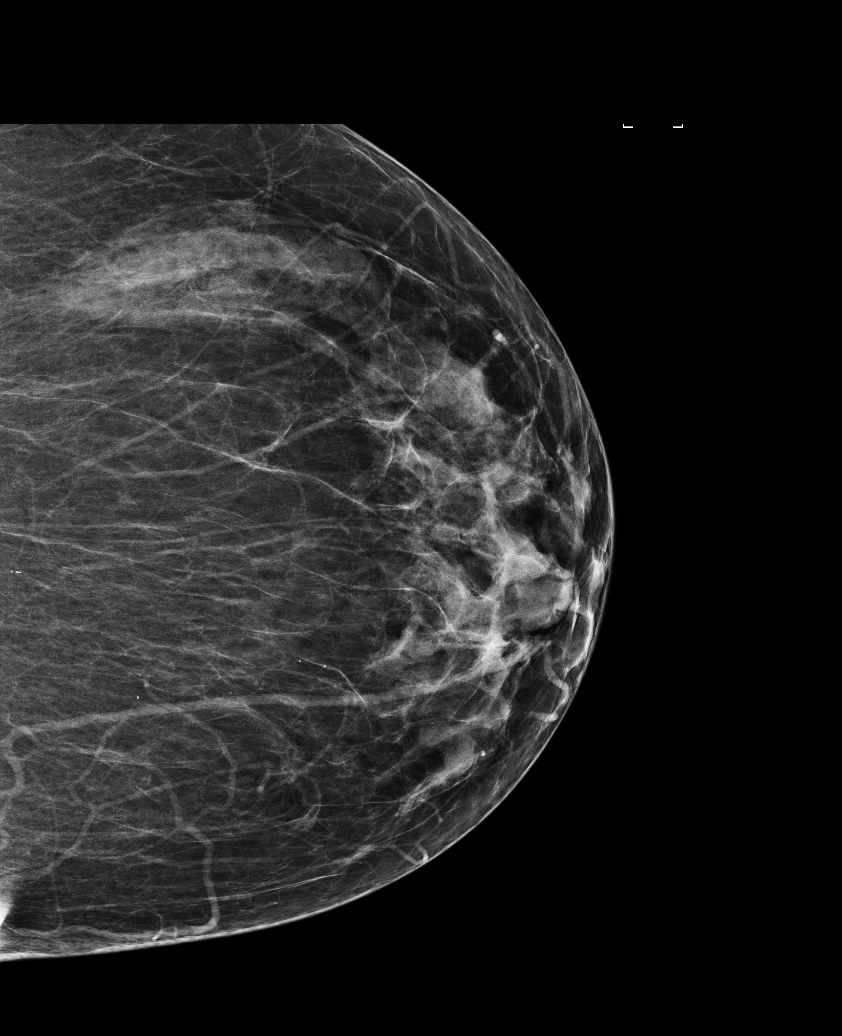

[L CC (2 of 2)]
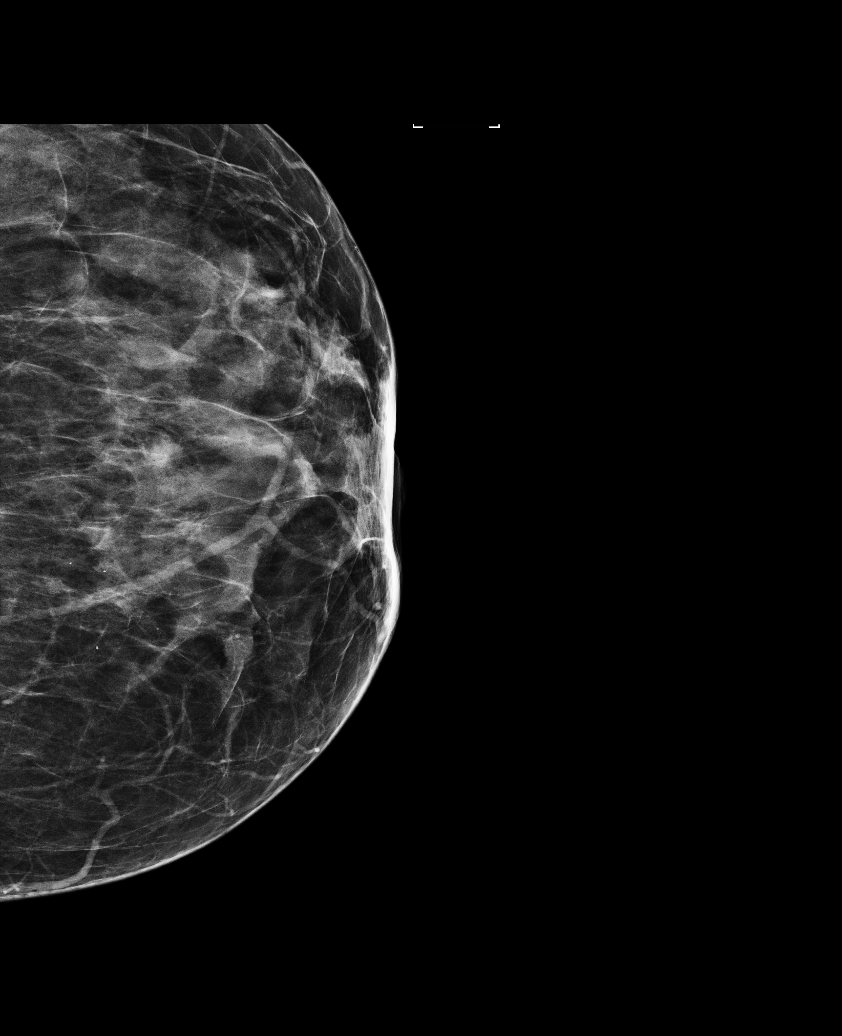

[R MLO]
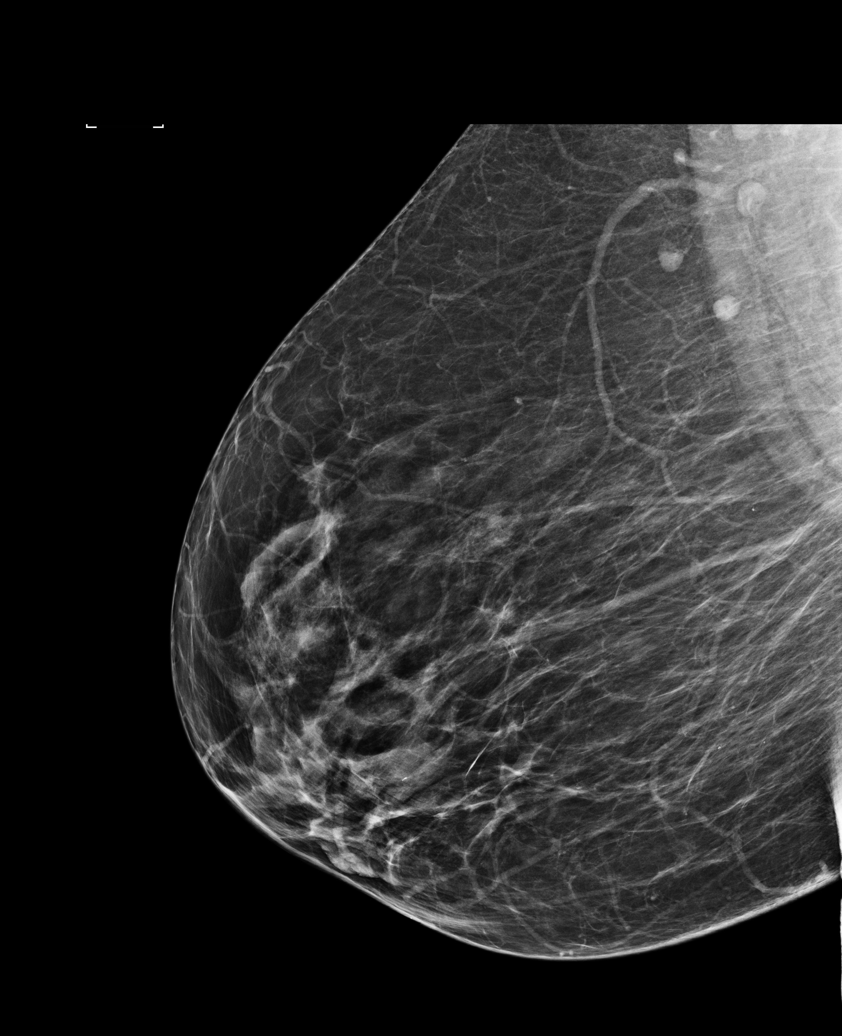

[6 of 6 positions shown; findings below may reference images not displayed]

ACR Breast Density Category c: The breast tissue is heterogeneously
dense, which may obscure small masses.
FINDINGS: There are no findings suspicious for malignancy. Images were
processed with CAD.
IMPRESSION: No mammographic evidence of malignancy. A result letter of this
screening mammogram will be mailed directly to the patient.

RECOMMENDATION:
Screening mammogram in one year. (Code:[0J])

BI-RADS CATEGORY  1: Negative.

## 2017-04-15 ENCOUNTER — Ambulatory Visit: Payer: BC Managed Care – PPO | Admitting: Sports Medicine

## 2017-04-16 ENCOUNTER — Ambulatory Visit: Payer: BLUE CROSS/BLUE SHIELD | Admitting: Podiatry

## 2017-04-16 ENCOUNTER — Encounter: Payer: Self-pay | Admitting: Podiatry

## 2017-04-16 VITALS — BP 144/72 | HR 72 | Ht 65.0 in | Wt 208.0 lb

## 2017-04-16 DIAGNOSIS — M21969 Unspecified acquired deformity of unspecified lower leg: Secondary | ICD-10-CM

## 2017-04-16 DIAGNOSIS — M7741 Metatarsalgia, right foot: Secondary | ICD-10-CM | POA: Diagnosis not present

## 2017-04-16 DIAGNOSIS — M216X1 Other acquired deformities of right foot: Secondary | ICD-10-CM | POA: Diagnosis not present

## 2017-04-16 DIAGNOSIS — M7742 Metatarsalgia, left foot: Secondary | ICD-10-CM

## 2017-04-16 DIAGNOSIS — M216X2 Other acquired deformities of left foot: Secondary | ICD-10-CM | POA: Diagnosis not present

## 2017-04-16 DIAGNOSIS — M21619 Bunion of unspecified foot: Secondary | ICD-10-CM | POA: Diagnosis not present

## 2017-04-16 NOTE — Patient Instructions (Signed)
Seen for bilateral foot pain. Noted of weak first metatarsal bone with bunion deformity and lateral weight shifting. Reviewed clinical findings and available treatment options. Ankle brace dispensed x 2 with instruction. Will benefit from Custom orthotics.

## 2017-04-16 NOTE — Progress Notes (Signed)
SUBJECTIVE: 62 y.o. year old female presents complaining of pain under the lateral side of both feet L>R for several months. 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  Review of Systems  Constitutional: Negative for chills, fever and weight loss.  HENT: Negative.   Eyes: Negative.   Respiratory: Negative.   Cardiovascular: Negative for chest pain, palpitations, orthopnea and claudication.       Diagnosed with Atrial Fibrillation 20 years ago.   Gastrointestinal: Negative.   Genitourinary: Negative.   Musculoskeletal:       Left knee replacement in 2005. Feeling of arthritic type pain in hands.  Skin: Negative.   Neurological: Negative.   Psychiatric/Behavioral: Negative.      OBJECTIVE: DERMATOLOGIC EXAMINATION: Normal findings.  VASCULAR EXAMINATION OF LOWER LIMBS: All pedal pulses are palpable with normal pulsation.  Capillary Filling times within 3 seconds in all digits.  No edema or erythema noted. Temperature gradient from tibial crest to dorsum of foot is within normal bilateral.  NEUROLOGIC EXAMINATION OF THE LOWER LIMBS: Diagnosed with diabetic neuropathy.   MUSCULOSKELETAL EXAMINATION: Positive for severe Hallux valgus with bunion deformity bilateral. Digital contracture 2nd bilateral.  ASSESSMENT: Lesser metatarsalgia bilateral. 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 STJ hyperpronation bilateral. Diabetic neuropathy.  PLAN: Reviewed clinical findings and available treatment options. Advised to try a new pair orthotics. Ankle braced placed for both feet to limit STJ pronation with instruction bilateral. Will get Orthotic coverage info prior to next appointment.

## 2017-04-25 ENCOUNTER — Telehealth: Payer: Self-pay | Admitting: *Deleted

## 2017-04-25 NOTE — Telephone Encounter (Signed)
Benefits for custom molded orthotics are as follows: Patient has a $1,000 deductible. $255.16 has been met of the deductible. Insurance will cover 70% of the allowed amount. Patient is eligible for 1 pair of orthotics per lifetime unless it's for replacement,foot growth or change in medical condition.Letter of medical necessity is necessary for replacement. Out of pocket max I 5,000 and 371.16 has been met.  Left message on vm for patient to call back.

## 2017-04-28 NOTE — Telephone Encounter (Signed)
Patient called back. Benefits were reviewed and they patient has declined to proceed with orthotics

## 2017-06-09 ENCOUNTER — Emergency Department (HOSPITAL_COMMUNITY)
Admission: EM | Admit: 2017-06-09 | Discharge: 2017-06-09 | Disposition: A | Discharge status: Home / Self Care | Payer: No Typology Code available for payment source | Attending: Emergency Medicine | Admitting: Emergency Medicine

## 2017-06-09 ENCOUNTER — Encounter (HOSPITAL_COMMUNITY): Payer: Self-pay | Admitting: *Deleted

## 2017-06-09 ENCOUNTER — Emergency Department (HOSPITAL_COMMUNITY): Payer: No Typology Code available for payment source

## 2017-06-09 ENCOUNTER — Other Ambulatory Visit: Payer: Self-pay

## 2017-06-09 DIAGNOSIS — W260XXA Contact with knife, initial encounter: Secondary | ICD-10-CM | POA: Insufficient documentation

## 2017-06-09 DIAGNOSIS — Y990 Civilian activity done for income or pay: Secondary | ICD-10-CM | POA: Insufficient documentation

## 2017-06-09 DIAGNOSIS — S91332A Puncture wound without foreign body, left foot, initial encounter: Secondary | ICD-10-CM | POA: Diagnosis present

## 2017-06-09 DIAGNOSIS — E119 Type 2 diabetes mellitus without complications: Secondary | ICD-10-CM | POA: Insufficient documentation

## 2017-06-09 DIAGNOSIS — E785 Hyperlipidemia, unspecified: Secondary | ICD-10-CM | POA: Insufficient documentation

## 2017-06-09 DIAGNOSIS — Z7984 Long term (current) use of oral hypoglycemic drugs: Secondary | ICD-10-CM | POA: Diagnosis not present

## 2017-06-09 DIAGNOSIS — Y9289 Other specified places as the place of occurrence of the external cause: Secondary | ICD-10-CM | POA: Insufficient documentation

## 2017-06-09 DIAGNOSIS — Y93E5 Activity, floor mopping and cleaning: Secondary | ICD-10-CM | POA: Diagnosis not present

## 2017-06-09 DIAGNOSIS — Z7982 Long term (current) use of aspirin: Secondary | ICD-10-CM | POA: Diagnosis not present

## 2017-06-09 DIAGNOSIS — Z87891 Personal history of nicotine dependence: Secondary | ICD-10-CM | POA: Diagnosis not present

## 2017-06-09 DIAGNOSIS — T148XXA Other injury of unspecified body region, initial encounter: Secondary | ICD-10-CM

## 2017-06-09 DIAGNOSIS — Z79899 Other long term (current) drug therapy: Secondary | ICD-10-CM | POA: Insufficient documentation

## 2017-06-09 IMAGING — CR DG FOOT COMPLETE 3+V*L*
3 series · 3 of 3 positions shown · non-contrast
Comparison: None.

CLINICAL DATA: Laceration to left foot.

EXAM:
LEFT FOOT - COMPLETE 3+ VIEW

[foot ap]
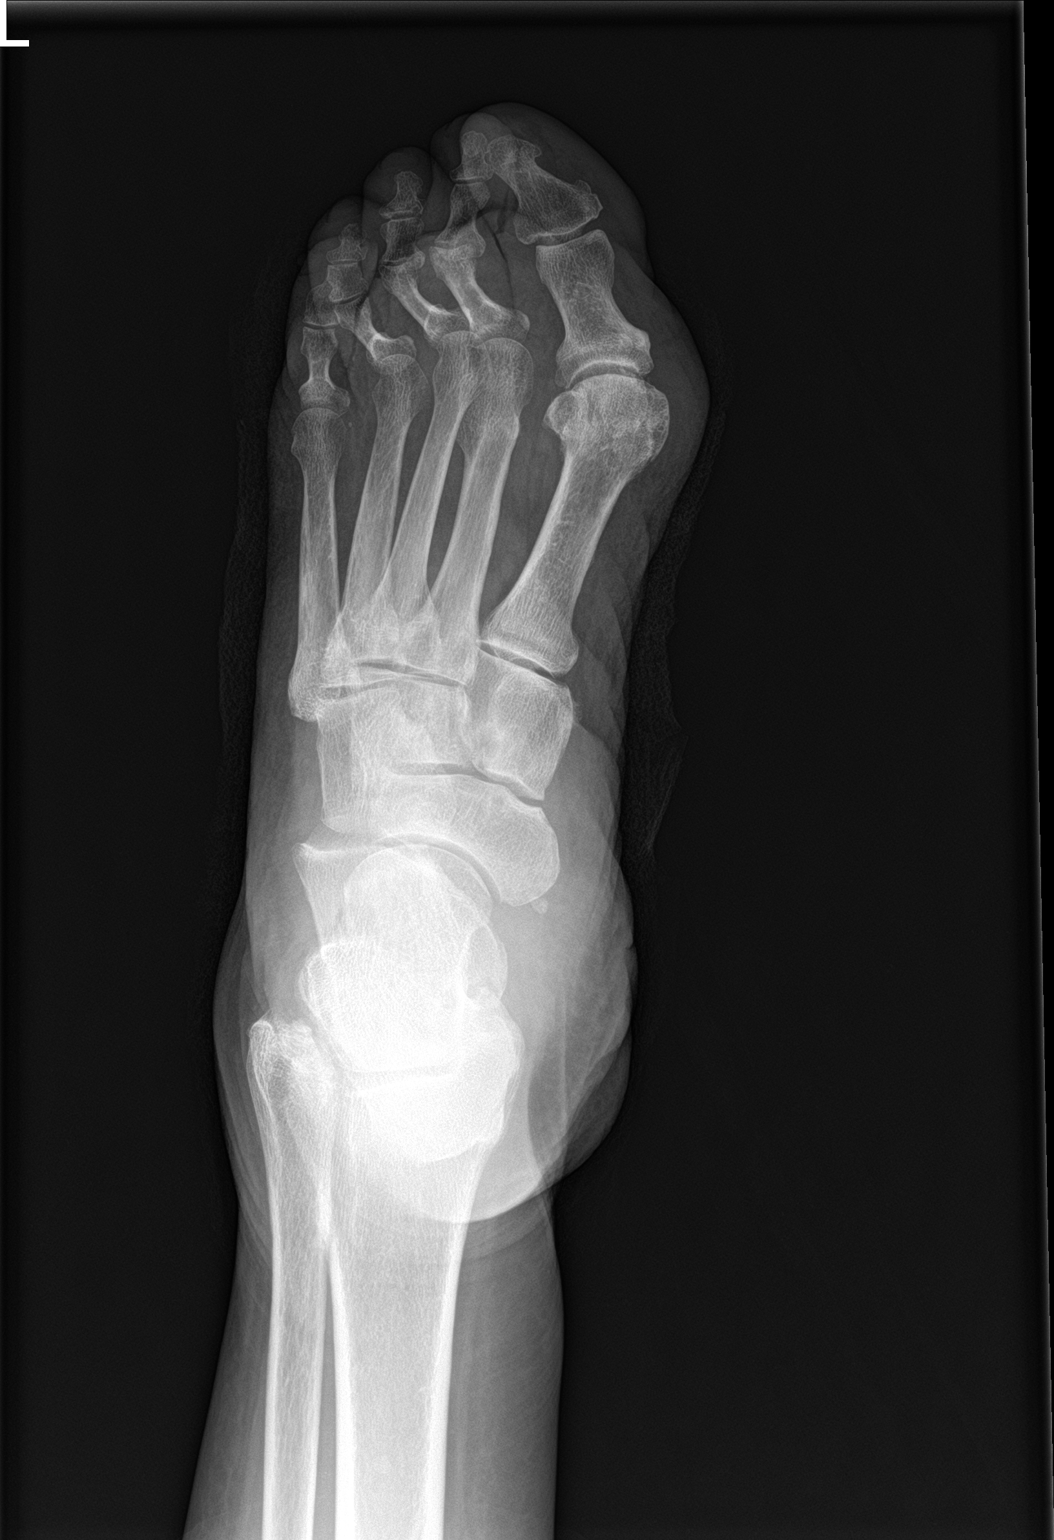

[foot obl]
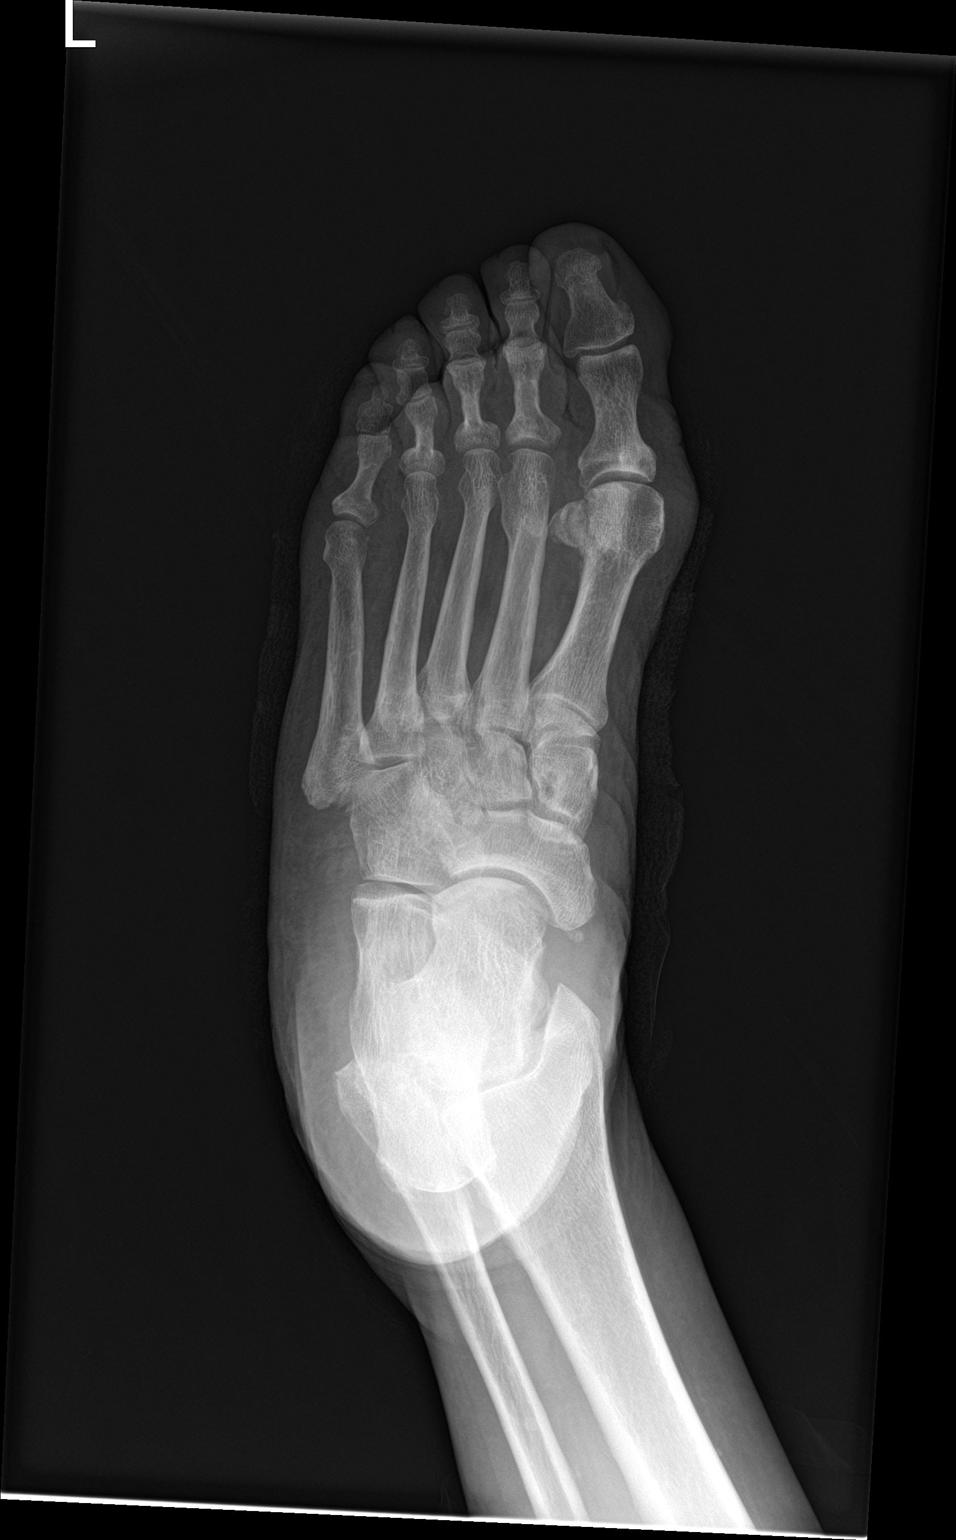

[foot lat]
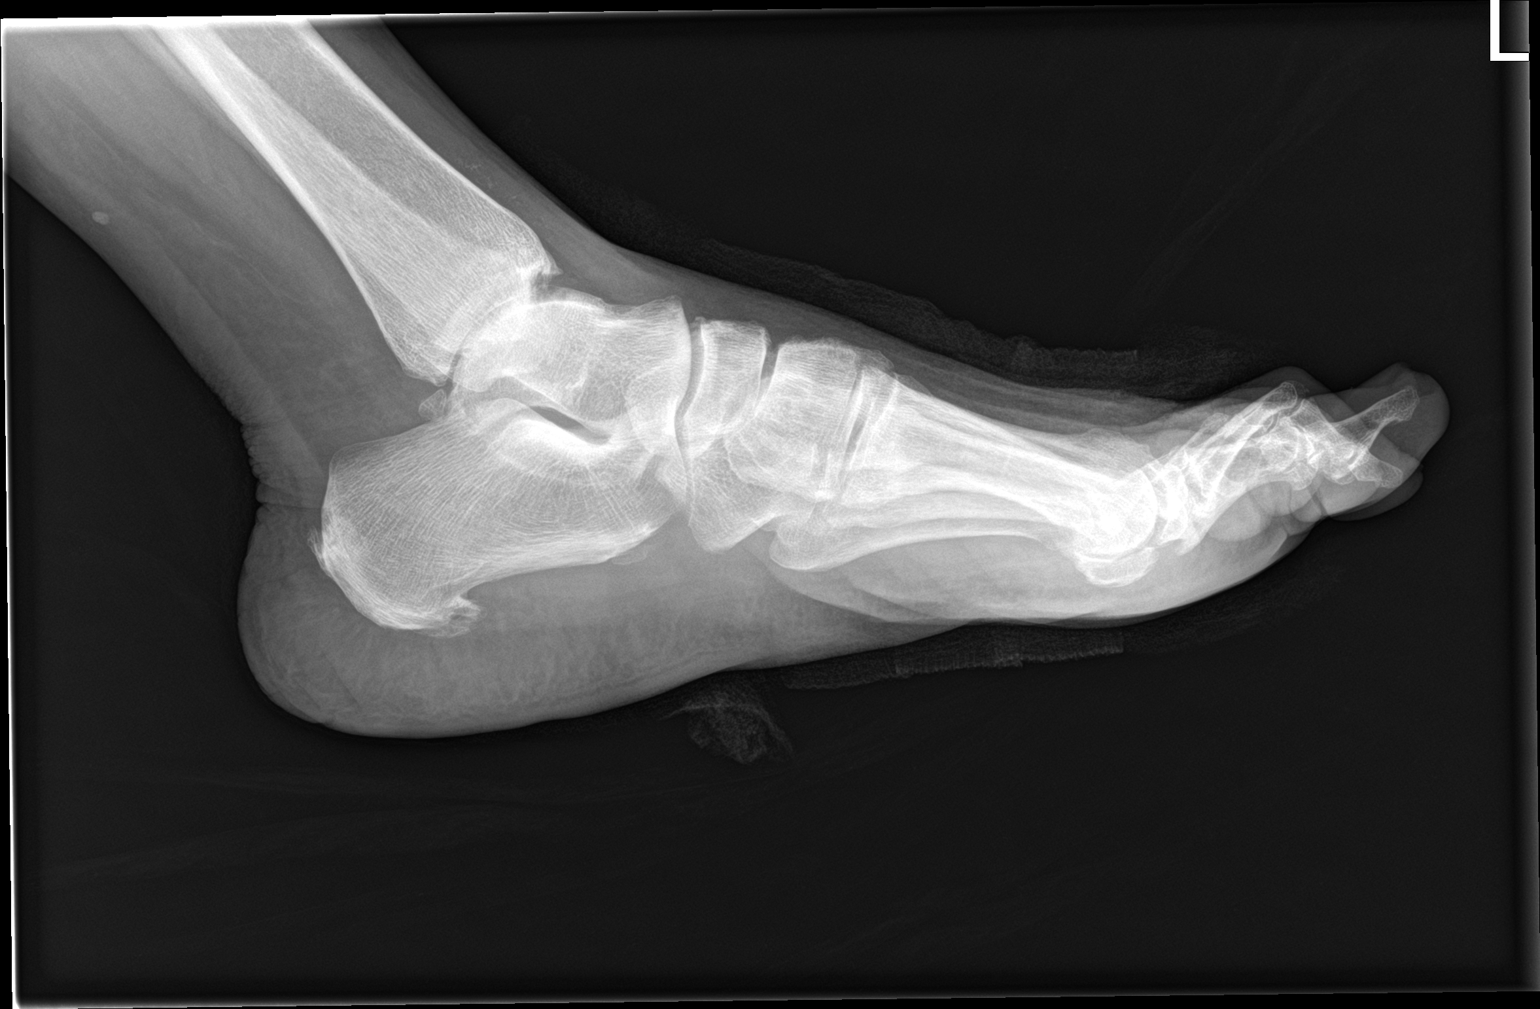

[3 of 3 positions shown; findings below may reference images not displayed]

FINDINGS: Hallux valgus deformity. Secondary degenerative changes of the first
metatarsophalangeal joint. No acute fracture or dislocation. Small
Achilles and calcaneal spurs. No radiopaque foreign object.
IMPRESSION: No acute osseous abnormality.

## 2017-06-09 MED ORDER — LEVOFLOXACIN 500 MG PO TABS: 500.0000 mg | ORAL_TABLET | Freq: Every day | ORAL | 0 refills | Status: DC

## 2017-06-09 NOTE — ED Provider Notes (Signed)
[facility] EMERGENCY DEPARTMENT Provider Note   CSN: [id number] Arrival date & time: 06/09/17  1648     History   Chief Complaint Chief Complaint  Patient presents with  . Foot Injury    HPI Brittany Klein is a 62 y.o. female.  The history is provided by the patient and medical records. No language interpreter was used.  Foot Injury   Pertinent negatives include no numbness.   Brittany Klein is a 62 y.o. female  with a PMH of DM who presents to the Emergency Department complaining of wound to the left foot.  Patient states that she was at work cleaning off the table when she accidentally knocked a knife off the table. The knife fell and struck her foot. She initially did not think much of this, but felt like her foot was wet. She took her shoe off and noticed that she was bleeding. It appears that knife went through her shoe and cut the top of her left foot. She applied pressure and got the bleeding controlled, then came to ED for further evaluation. No medications taken prior to arrival for symptoms. No numbness, tingling or weakness. No difficulty with movement or ambulation. Denies any pain. Last tetanus shot was in December 2018.   Past Medical History:  Diagnosis Date  . Anemia   . Arthritis    "knees, hands" (08/04/2012)  . Chronic lower back pain    "all the time" (08/04/2012)  . Dysrhythmia    Tachycardia, comes and goes. Increases with activity  . History of blood transfusion 2004   "related to knee replacement" (08/04/2012)  . Hyperlipemia   . Type II diabetes mellitus (HCC)     There are no active problems to display for this patient.   Past Surgical History:  Procedure Laterality Date  . CARPAL TUNNEL RELEASE Left 1990's  . HERNIA REPAIR  2000   umbilical  . JOINT REPLACEMENT    . TOTAL KNEE ARTHROPLASTY Left 10/2002  . TOTAL KNEE REVISION Left 08/04/2012  . TOTAL KNEE REVISION Left 08/04/2012   Procedure: LEFT TOTAL KNEE REVISION;  Surgeon:  Cammy Copa, MD;  Location: [facility];  Service: Orthopedics;  Laterality: Left;  . TUBAL LIGATION  1980     OB History   None      Home Medications    Prior to Admission medications   Medication Sig Start Date End Date Taking? Authorizing Provider  aspirin EC 81 MG tablet Take 81 mg by mouth daily.    [provider]  glimepiride (AMARYL) 4 MG tablet Take 6 mg by mouth daily before breakfast.    [provider]  levofloxacin (LEVAQUIN) 500 MG tablet Take 1 tablet (500 mg total) by mouth daily. 06/09/17   Clytee Heinrich, Chase Picket, PA-C  metFORMIN (GLUCOPHAGE-XR) 500 MG 24 hr tablet Take 1,000 mg by mouth 2 (two) times daily.    [provider]  simvastatin (ZOCOR) 40 MG tablet Take 40 mg by mouth at bedtime.    [provider]    Family History No family history on file.  Social History Social History   Tobacco Use  . Smoking status: Former Smoker    Packs/day: 1.00    Years: 23.00    Pack years: 23.00    Types: Cigarettes    Last attempt to quit: 12/01/1998    Years since quitting: 18.5  . Smokeless tobacco: Never Used  Substance Use Topics  . Alcohol use: Yes  Comment: 08/05/2012 "don't drink anymore; stopped ~ 11/1998; never had problem w/it"  . Drug use: No     Allergies   Morphine and related   Review of Systems Review of Systems  Musculoskeletal: Negative for arthralgias and myalgias.  Skin: Positive for wound.  Neurological: Negative for weakness and numbness.     Physical Exam Updated Vital Signs BP (!) 126/94 (BP Location: Right Arm)   Pulse 80   Temp 98.4 F (36.9 C) (Oral)   Resp 18   SpO2 100%   Physical Exam  Constitutional: She appears well-developed and well-nourished. No distress.  HENT:  Head: Normocephalic and atraumatic.  Neck: Neck supple.  Cardiovascular: Normal rate, regular rhythm and normal heart sounds.  No murmur heard. Pulmonary/Chest: Effort normal and breath sounds normal. No  respiratory distress. She has no wheezes. She has no rales.  Musculoskeletal:  Full ROM of the left foot/ankle. Wiggles all toes without difficulty. 2+ DP. Sensation intact. Good cap refill.   Neurological: She is alert.  Skin: Skin is warm and dry.  1 cm laceration to the top of the left foot.  Nursing note and vitals reviewed.    ED Treatments / Results  Labs (all labs ordered are listed, but only abnormal results are displayed) Labs Reviewed - No data to display  EKG None  Radiology Dg Foot Complete Left  Result Date: 06/09/2017 CLINICAL DATA:  Laceration to left foot. EXAM: LEFT FOOT - COMPLETE 3+ VIEW COMPARISON:  None. FINDINGS: Hallux valgus deformity. Secondary degenerative changes of the first metatarsophalangeal joint. No acute fracture or dislocation. Small Achilles and calcaneal spurs. No radiopaque foreign object. IMPRESSION: No acute osseous abnormality. Electronically Signed   By: Jeronimo Greaves M.D.   On: 06/09/2017 19:30    Procedures .Marland KitchenLaceration Repair Date/Time: 06/09/2017 8:37 PM Performed by: Azha Constantin, Chase Picket, PA-C Authorized by: Raekwon Winkowski, Chase Picket, PA-C   Consent:    Consent obtained:  Verbal   Consent given by:  Patient   Risks discussed:  Infection, poor cosmetic result and poor wound healing Anesthesia (see MAR for exact dosages):    Anesthesia method:  None Laceration details:    Location:  Foot   Foot location:  Top of L foot   Length (cm):  1 Repair type:    Repair type:  Simple Pre-procedure details:    Preparation:  Patient was prepped and draped in usual sterile fashion Exploration:    Hemostasis achieved with:  Direct pressure Treatment:    Area cleansed with:  Saline   Amount of cleaning:  Standard   Irrigation solution:  Sterile saline Skin repair:    Repair method:  Tissue adhesive Approximation:    Approximation:  Close Post-procedure details:    Dressing:  Open (no dressing)   Patient tolerance of procedure:  Tolerated  well, no immediate complications   (including critical care time)  Medications Ordered in ED Medications - No data to display   Initial Impression / Assessment and Plan / ED Course  I have reviewed the triage vital signs and the nursing notes.  Pertinent labs & imaging results that were available during my care of the patient were reviewed by me and considered in my medical decision making (see chart for details).    Brittany Klein is a 62 y.o. female who presents to ED for wound to left foot which occurred just prior to arrival. NVI. X-ray negative. Wound is very superficial. Repaired as dictated above with dermabond. Wound occurred when knife fell  off of a table and went through shoe, cutting the top of her foot. Given knife went through shoe, along with her hx of DM, will treat with short 3 day course of ABX. Wound care instructions / return precautions discussed. All questions answered.    Patient discussed with Dr. Adriana Simas who agrees with treatment plan.    Final Clinical Impressions(s) / ED Diagnoses   Final diagnoses:  Puncture wound    ED Discharge Orders        Ordered    levofloxacin (LEVAQUIN) 500 MG tablet  Daily     06/09/17 2024       Tymara Saur, Chase Picket, PA-C 06/09/17 2042    Donnetta Hutching, MD 06/12/17 [phone]

## 2017-06-09 NOTE — ED Triage Notes (Addendum)
Pt is here with knife wound to left foot by accidental butcher knife that feel off table and went through shoe into foot.  Pt has a small puncture site with a slight ooze.  Pt had tetanus in 12/2016.  Pt is able to wiggle toes and bleeding controlled. Site recovered

## 2017-06-09 NOTE — Discharge Instructions (Addendum)
It was my pleasure taking care of you today!   Please take all of your antibiotics until finished! This is to prevent infection.   Here is some general advice for your wound with the adhesive placed today:  ?Do not bandage a wound treated with an adhesive. The adhesive works like a bandage. ?Do not use antibiotic ointment as it can break down the adhesive. ?You can shower while the adhesive is on your skin, but do not take a bath or soak or scrub the area for 7 to 10 days. Dry your skin by patting it gently with a towel.  The adhesive will peel off on its own, usually by 5 to 10 days. If after 10 days, you still have adhesive on you, you can use antibiotic ointment or petroleum jelly to get it off. You do not need to see the doctor again unless the wound doesn?t heal well or you have signs of infection, such as redness, swelling, or pus.  Return to the ER for the above signs of infection, new or worsening symptoms, or any additional concerns.

## 2017-09-02 ENCOUNTER — Ambulatory Visit (INDEPENDENT_AMBULATORY_CARE_PROVIDER_SITE_OTHER): Payer: Self-pay

## 2017-09-02 ENCOUNTER — Ambulatory Visit (INDEPENDENT_AMBULATORY_CARE_PROVIDER_SITE_OTHER): Payer: BLUE CROSS/BLUE SHIELD | Admitting: Orthopedic Surgery

## 2017-09-02 ENCOUNTER — Encounter (INDEPENDENT_AMBULATORY_CARE_PROVIDER_SITE_OTHER): Payer: Self-pay | Admitting: Orthopedic Surgery

## 2017-09-02 VITALS — Ht 65.0 in | Wt 208.0 lb

## 2017-09-02 DIAGNOSIS — M6701 Short Achilles tendon (acquired), right ankle: Secondary | ICD-10-CM | POA: Diagnosis not present

## 2017-09-02 DIAGNOSIS — M79672 Pain in left foot: Secondary | ICD-10-CM | POA: Diagnosis not present

## 2017-09-02 DIAGNOSIS — M79671 Pain in right foot: Secondary | ICD-10-CM | POA: Diagnosis not present

## 2017-09-02 DIAGNOSIS — M6702 Short Achilles tendon (acquired), left ankle: Secondary | ICD-10-CM | POA: Diagnosis not present

## 2017-09-08 ENCOUNTER — Encounter (INDEPENDENT_AMBULATORY_CARE_PROVIDER_SITE_OTHER): Payer: Self-pay | Admitting: Orthopedic Surgery

## 2017-09-08 NOTE — Progress Notes (Signed)
Office Visit Note   [name]           Date of Birth: 03/11/1955           MRN: [id number] Visit Date: 09/02/2017              Requested by: No referring provider defined for this encounter. PCP: System, Pcp Not In  Chief Complaint  Patient presents with  . Left Foot - Pain  . Right Foot - Pain      HPI: Patient is a 62 year old woman who complains of bilateral foot pain left worse than right she states she has had flatfeet clawing of her toes she is [facility].  Patient states she tried orthotics which caused blisters.  Patient states he has had the suggestion of cortisone shots which helped but she has not tried this.  Assessment & Plan: Visit Diagnoses:  1. Foot pain, bilateral   2. Achilles tendon contracture, bilateral     Plan: Patient was given instruction and demonstrated Achilles stretching to unload pressure across the forefoot.  Recommended a stiff soled Trail running or walking sneaker to decrease pressure across the forefoot.  Follow-Up Instructions: Return in about 4 weeks (around 09/30/2017).   Ortho Exam  Patient is alert, oriented, no adenopathy, well-dressed, normal affect, normal respiratory effort. Examination patient has strong dorsalis pedis pulses bilaterally.  She has heel cord contracture with dorsiflexion only to neutral.  She is tender to palpation beneath the metatarsal heads radiographs shows no acute stress fractures the left foot shows what appears to be an old stress fracture through the metatarsal neck.  Imaging: No results found. No images are attached to the encounter.  Labs: Lab Results  Component Value Date   ESRSEDRATE 45 (H) 09/03/2006   REPTSTATUS 08/09/2012 FINAL 08/04/2012   REPTSTATUS 08/06/2012 FINAL 08/04/2012   GRAMSTAIN  08/04/2012    RARE WBC PRESENT,BOTH PMN AND MONONUCLEAR NO SQUAMOUS EPITHELIAL CELLS SEEN NO ORGANISMS SEEN   GRAMSTAIN  08/04/2012      RARE WBC PRESENT, PREDOMINANTLY MONONUCLEAR NO SQUAMOUS EPITHELIAL CELLS SEEN NO ORGANISMS SEEN   CULT NO ANAEROBES ISOLATED 08/04/2012   CULT NO GROWTH 2 DAYS 08/04/2012     Lab Results  Component Value Date   ALBUMIN 4.4 08/17/2009    Body mass index is 34.61 kg/m.  Orders:  Orders Placed This Encounter  Procedures  . XR Foot 2 Views Right  . XR Foot 2 Views Left   No orders of the defined types were placed in this encounter.    Procedures: No procedures performed  Clinical Data: No additional findings.  ROS:  All other systems negative, except as noted in the HPI. Review of Systems  Objective: Vital Signs: Ht 5\' 5"  (1.651 m)   Wt 208 lb (94.3 kg)   BMI 34.61 kg/m   Specialty Comments:  No specialty comments available.  PMFS History: There are no active problems to display for this patient.  Past Medical History:  Diagnosis Date  . Anemia   . Arthritis    "knees, hands" (08/04/2012)  . Chronic lower back pain    "all the time" (08/04/2012)  . Dysrhythmia    Tachycardia, comes and goes. Increases with activity  . History of blood transfusion 2004   "related to knee replacement" (08/04/2012)  . Hyperlipemia   . Type II diabetes mellitus (HCC)     History reviewed. No pertinent family history.  Past Surgical History:  Procedure Laterality Date  . CARPAL TUNNEL RELEASE Left 1990's  . HERNIA REPAIR  2000   umbilical  . JOINT REPLACEMENT    . TOTAL KNEE ARTHROPLASTY Left 10/2002  . TOTAL KNEE REVISION Left 08/04/2012  . TOTAL KNEE REVISION Left 08/04/2012   Procedure: LEFT TOTAL KNEE REVISION;  Surgeon: Cammy CopaGregory Scott Dean, MD;  [facility];  Service: Orthopedics;  Laterality: Left;  . TUBAL LIGATION  1980   Social History   Occupational History  . Not on file  Tobacco Use  . Smoking status: Former Smoker    Packs/day: 1.00    Years: 23.00    Pack years: 23.00    Types: Cigarettes    Last attempt to quit: 12/01/1998    Years since  quitting: 18.7  . Smokeless tobacco: Never Used  Substance and Sexual Activity  . Alcohol use: Yes    Comment: 08/05/2012 "don't drink anymore; stopped ~ 11/1998; never had problem w/it"  . Drug use: No  . Sexual activity: Never

## 2017-09-30 DIAGNOSIS — I10 Essential (primary) hypertension: Secondary | ICD-10-CM

## 2017-09-30 DIAGNOSIS — E1165 Type 2 diabetes mellitus with hyperglycemia: Secondary | ICD-10-CM | POA: Diagnosis not present

## 2017-11-04 ENCOUNTER — Other Ambulatory Visit: Payer: Self-pay | Admitting: Nurse Practitioner

## 2018-01-16 ENCOUNTER — Ambulatory Visit: Payer: BLUE CROSS/BLUE SHIELD | Admitting: Nurse Practitioner

## 2018-01-16 LAB — HM PAP SMEAR: HM Pap smear: NORMAL

## 2018-01-21 ENCOUNTER — Other Ambulatory Visit: Payer: Self-pay | Admitting: Nurse Practitioner

## 2018-03-04 ENCOUNTER — Ambulatory Visit: Payer: BLUE CROSS/BLUE SHIELD | Admitting: Nurse Practitioner

## 2018-03-04 ENCOUNTER — Encounter: Payer: Self-pay | Admitting: Nurse Practitioner

## 2018-03-04 VITALS — BP 110/74 | HR 76 | Temp 97.7°F | Ht 62.5 in | Wt 215.8 lb

## 2018-03-04 DIAGNOSIS — Z794 Long term (current) use of insulin: Secondary | ICD-10-CM | POA: Insufficient documentation

## 2018-03-04 DIAGNOSIS — N8110 Cystocele, unspecified: Secondary | ICD-10-CM | POA: Diagnosis not present

## 2018-03-04 DIAGNOSIS — E119 Type 2 diabetes mellitus without complications: Secondary | ICD-10-CM

## 2018-03-04 MED ORDER — GLIMEPIRIDE 4 MG PO TABS: ORAL_TABLET | ORAL | 1 refills | Status: DC

## 2018-03-04 NOTE — Patient Instructions (Signed)
Pelvic Organ Prolapse Pelvic organ prolapse is the stretching, bulging, or dropping of pelvic organs into an abnormal position. It happens when the muscles and tissues that surround and support pelvic structures become weak or stretched. Pelvic organ prolapse can involve the:  Vagina (vaginal prolapse).  Uterus (uterine prolapse).  Bladder (cystocele).  Rectum (rectocele).  Intestines (enterocele). When organs other than the vagina are involved, they often bulge into the vagina or protrude from the vagina, depending on how severe the prolapse is. What are the causes? This condition may be caused by:  Pregnancy, labor, and childbirth.  Past pelvic surgery.  Decreased production of the hormone estrogen associated with menopause.  Consistently lifting more than 50 lb (23 kg).  Obesity.  Long-term inability to pass stool (chronic constipation).  A cough that lasts a long time (chronic).  Buildup of fluid in the abdomen due to certain diseases and other conditions. What are the signs or symptoms? Symptoms of this condition include:  Passing a little urine (loss of bladder control) when you cough, sneeze, strain, and exercise (stress incontinence). This may be worse immediately after childbirth. It may gradually improve over time.  Feeling pressure in your pelvis or vagina. This pressure may increase when you cough or when you are passing stool.  A bulge that protrudes from the opening of your vagina.  Difficulty passing urine or stool.  Pain in your lower back.  Pain, discomfort, or disinterest in sex.  Repeated bladder infections (urinary tract infections).  Difficulty inserting a tampon. In some people, this condition causes no symptoms. How is this diagnosed? This condition may be diagnosed based on a vaginal and rectal exam. During the exam, you may be asked to cough and strain while you are lying down, sitting, and standing up. Your health care provider will  determine if other tests are required, such as bladder function tests. How is this treated? Treatment for this condition may depend on your symptoms. Treatment may include:  Lifestyle changes, such as changes to your diet.  Emptying your bladder at scheduled times (bladder training therapy). This can help reduce or avoid urinary incontinence.  Estrogen. Estrogen may help mild prolapse by increasing the strength and tone of pelvic floor muscles.  Kegel exercises. These may help mild cases of prolapse by strengthening and tightening the muscles of the pelvic floor.  A soft, flexible device that helps support the vaginal walls and keep pelvic organs in place (pessary). This is inserted into your vagina by your health care provider.  Surgery. This is often the only form of treatment for severe prolapse. Follow these instructions at home:  Avoid drinking beverages that contain caffeine or alcohol.  Increase your intake of high-fiber foods. This can help decrease constipation and straining during bowel movements.  Lose weight if recommended by your health care provider.  Wear a sanitary pad or adult diapers if you have urinary incontinence.  Avoid heavy lifting and straining with exercise and work. Do not hold your breath when you perform mild to moderate lifting and exercise activities. Limit your activities as directed by your health care provider.  Do Kegel exercises as directed by your health care provider. To do this: ? Squeeze your pelvic floor muscles tight. You should feel a tight lift in your rectal area and a tightness in your vaginal area. Keep your stomach, buttocks, and legs relaxed. ? Hold the muscles tight for up to 10 seconds. ? Relax your muscles. ? Repeat this exercise 50 times a day,   or as many times as told by your health care provider. Continue to do this exercise for at least 4-6 weeks, or for as long as told by your health care provider.  Take over-the-counter and  prescription medicines only as told by your health care provider.  If you have a pessary, take care of it as told by your health care provider.  Keep all follow-up visits as told by your health care provider. This is important. Contact a health care provider if you:  Have symptoms that interfere with your daily activities or sex life.  Need medicine to help with the discomfort.  Notice bleeding from your vagina that is not related to your period.  Have a fever.  Have pain or bleeding when you urinate.  Have bleeding when you pass stool.  Pass urine when you have sex.  Have chronic constipation.  Have a pessary that falls out.  Have bad smelling vaginal discharge.  Have an unusual, low pain in your abdomen. Summary  Pelvic organ prolapse is the stretching, bulging, or dropping of pelvic organs into an abnormal position. It happens when the muscles and tissues that surround and support pelvic structures become weak or stretched.  When organs other than the vagina are involved, they often bulge into the vagina or protrude from the vagina, depending on how severe the prolapse is.  In most cases, this condition needs to be treated only if it produces symptoms. Treatment may include lifestyle changes, estrogen, Kegel exercises, pessary insertion, or surgery.  Avoid heavy lifting and straining with exercise and work. Do not hold your breath when you perform mild to moderate lifting and exercise activities. Limit your activities as directed by your health care provider. This information is not intended to replace advice given to you by your health care provider. Make sure you discuss any questions you have with your health care provider. Document Released: 08/04/2013 Document Revised: 01/29/2017 Document Reviewed: 01/29/2017 Elsevier Interactive Patient Education  2019 Elsevier Inc.  

## 2018-03-04 NOTE — Progress Notes (Signed)
Subjective:     Patient ID: Brittany Klein , female    DOB: 08/23/1955 , 63 y.o.   MRN: [id number]   Chief Complaint  Patient presents with  . Diabetes  . PROLASPED BLADDER    patient would like to be referred to someone for this. pt patient stated her OBGYN recommended a ring but the patient does not want that     HPI  She has been seeing Dr. Renaldo Fiddler for her prolapsed bladder, she was to wear a pessary which she did not like.  She continues to have urinary frequency.  She was to have a ultrasound yesterday but did not go due to being upset.    Diabetes  She presents for her follow-up diabetic visit. She has type 2 diabetes mellitus. Her disease course has been stable. Pertinent negatives for hypoglycemia include no confusion, dizziness, mood changes or nervousness/anxiousness. Pertinent negatives for diabetes include no blurred vision, no chest pain, no fatigue, no polydipsia, no polyphagia and no polyuria. There are no hypoglycemic complications. Symptoms are stable. There are no diabetic complications. Risk factors for coronary artery disease include sedentary lifestyle and diabetes mellitus. Current diabetic treatment includes oral agent (monotherapy). She is compliant with treatment most of the time. She is following a generally healthy diet. When asked about meal planning, she reported none. She has not had a previous visit with a dietitian. She rarely participates in exercise. Eye exam is not current.     Past Medical History:  Diagnosis Date  . Anemia   . Arthritis    "knees, hands" (08/04/2012)  . Chronic lower back pain    "all the time" (08/04/2012)  . Dysrhythmia    Tachycardia, comes and goes. Increases with activity  . History of blood transfusion 2004   "related to knee replacement" (08/04/2012)  . Hyperlipemia   . Type II diabetes mellitus (HCC)      No family history on file.   Current Outpatient Medications:  .  aspirin EC 81 MG tablet, Take 81 mg by mouth daily.,  Disp: , Rfl:  .  cyanocobalamin 1000 MCG tablet, Take 1,000 mcg by mouth daily., Disp: , Rfl:  .  glimepiride (AMARYL) 4 MG tablet, TAKE 1 AND A HALF TABLET BY MOUTH EVERY DAY, Disp: 30 tablet, Rfl: 0 .  metFORMIN (GLUCOPHAGE-XR) 500 MG 24 hr tablet, Take 1,000 mg by mouth 2 (two) times daily., Disp: , Rfl:  .  simvastatin (ZOCOR) 40 MG tablet, TAKE 1 TABLET DAILY, Disp: 30 tablet, Rfl: 0   Allergies  Allergen Reactions  . Morphine And Related Itching     Review of Systems  Constitutional: Negative.  Negative for fatigue.  Eyes: Negative for blurred vision.  Respiratory: Negative.   Cardiovascular: Negative.  Negative for chest pain.  Gastrointestinal: Negative.   Endocrine: Negative.  Negative for polydipsia, polyphagia and polyuria.  Skin: Negative.   Neurological: Negative.  Negative for dizziness.  Psychiatric/Behavioral: Negative for confusion. The patient is not nervous/anxious.      Today's Vitals   03/04/18 1518  BP: 110/74  Pulse: 76  Temp: 97.7 F (36.5 C)  TempSrc: Oral  SpO2: 94%  Weight: 215 lb 12.8 oz (97.9 kg)  Height: 5' 2.5" (1.588 m)  PainSc: 0-No pain   Body mass index is 38.84 kg/m.   Objective:  Physical Exam Vitals signs reviewed.  Constitutional:      Appearance: She is well-developed.  Neck:     Musculoskeletal: Normal range of motion and neck  supple.  Cardiovascular:     Rate and Rhythm: Normal rate and regular rhythm.     Heart sounds: Normal heart sounds. No murmur.  Pulmonary:     Effort: Pulmonary effort is normal.     Breath sounds: Normal breath sounds.  Chest:     Chest wall: No tenderness.  Musculoskeletal: Normal range of motion.  Skin:    General: Skin is warm and dry.     Capillary Refill: Capillary refill takes less than 2 seconds.  Neurological:     General: No focal deficit present.     Mental Status: She is alert and oriented to person, place, and time.         Assessment And Plan:     1. Type 2 diabetes  mellitus without complication, without long-term current use of insulin (HCC)  Chronic, poorly controlled  Continue with current medications  Encouraged to limit intake of sugary foods and drinks  Encouraged to increase physical activity to 150 minutes per week - glimepiride (AMARYL) 4 MG tablet; Take 1 1/2 tab daily  Dispense: 135 tablet; Refill: 1 - CMP14 + Anion Gap - Hemoglobin A1c  2. Prolapse of female bladder, acquired  She is currently being followed by GYN and is to contact them in reference to her missed appt.     Arnette Felts, FNP

## 2018-03-05 LAB — CMP14 + ANION GAP
ALBUMIN: 4.2 g/dL (ref 3.8–4.8)
ALK PHOS: 82 IU/L (ref 39–117)
ALT: 15 IU/L (ref 0–32)
ANION GAP: 13 mmol/L (ref 10.0–18.0)
AST: 17 IU/L (ref 0–40)
Albumin/Globulin Ratio: 1.6 (ref 1.2–2.2)
BUN/Creatinine Ratio: 30 — ABNORMAL HIGH (ref 12–28)
BUN: 23 mg/dL (ref 8–27)
Bilirubin Total: 0.2 mg/dL (ref 0.0–1.2)
CHLORIDE: 103 mmol/L (ref 96–106)
CO2: 24 mmol/L (ref 20–29)
CREATININE: 0.77 mg/dL (ref 0.57–1.00)
Calcium: 9.2 mg/dL (ref 8.7–10.3)
GFR calc Af Amer: 96 mL/min/{1.73_m2} (ref >59)
GFR calc non Af Amer: 84 mL/min/{1.73_m2} (ref >59)
GLUCOSE: 63 mg/dL — AB (ref 65–99)
Globulin, Total: 2.6 g/dL (ref 1.5–4.5)
Potassium: 4.6 mmol/L (ref 3.5–5.2)
Sodium: 140 mmol/L (ref 134–144)
Total Protein: 6.8 g/dL (ref 6.0–8.5)

## 2018-03-05 LAB — HEMOGLOBIN A1C
ESTIMATED AVERAGE GLUCOSE: 171 mg/dL
HEMOGLOBIN A1C: 7.6 % — AB (ref 4.8–5.6)

## 2018-03-13 NOTE — Progress Notes (Signed)
It is not recommended to stop any medications at this time but if we can get her A1c down with the second medication this may help to come off the other medication.

## 2018-03-16 ENCOUNTER — Other Ambulatory Visit: Payer: Self-pay | Admitting: Nurse Practitioner

## 2018-03-16 DIAGNOSIS — E119 Type 2 diabetes mellitus without complications: Secondary | ICD-10-CM

## 2018-03-17 ENCOUNTER — Encounter: Payer: Self-pay | Admitting: Nurse Practitioner

## 2018-03-20 ENCOUNTER — Ambulatory Visit: Payer: Self-pay

## 2018-03-20 DIAGNOSIS — E119 Type 2 diabetes mellitus without complications: Secondary | ICD-10-CM

## 2018-03-20 NOTE — Chronic Care Management (AMB) (Signed)
  Care Management Note   Brittany Klein is a 63 y.o. year old female who sees Arnette Felts, FNP for primary care. [name] asked the CM team to consult with the patient for assistance with diabetes education.  Review of patient status, including review of consultants reports, rand collaboration with appropriate care team members and the patient's provider was performed as part of comprehensive patient evaluation and provision of chronic care management services. Telephone outreach to patient today to introduce CCM services.   Unfortunately, today's outreach call to introduce CCM services was unsuccessful. SW left a HIPAA compliant voice message requesting a return call.  Follow Up Plan: The CCM team will outreach the patient within the next week if a return call is not received.  Bevelyn Ngo, BSW, CDP [facility] / [facility] Care Management Social Worker [phone]

## 2018-03-21 ENCOUNTER — Other Ambulatory Visit: Payer: Self-pay | Admitting: Nurse Practitioner

## 2018-03-23 ENCOUNTER — Other Ambulatory Visit: Payer: Self-pay

## 2018-03-23 MED ORDER — SIMVASTATIN 40 MG PO TABS: 40.0000 mg | ORAL_TABLET | Freq: Every day | ORAL | 0 refills | Status: DC

## 2018-03-23 MED ORDER — METFORMIN HCL ER 500 MG PO TB24: 1000.0000 mg | ORAL_TABLET | Freq: Two times a day (BID) | ORAL | 0 refills | Status: DC

## 2018-03-24 ENCOUNTER — Ambulatory Visit: Payer: Self-pay

## 2018-03-24 DIAGNOSIS — E119 Type 2 diabetes mellitus without complications: Secondary | ICD-10-CM

## 2018-03-24 NOTE — Chronic Care Management (AMB) (Signed)
  Care Management Note   Brittany Klein is a 63 y.o. year old female who is a primary care patient of Minette Brine, [place]. [name] asked the CM team to consult with the patient for diabetes education.  Review of patient status, including review of consultants reports, rand collaboration with appropriate care team members and the patient's provider was performed as part of comprehensive patient evaluation and provision of chronic care management services. Telephone outreach to patient today to introduce CCM services.   I reached out to [name] by phone today.   [name] was given information about Chronic Care Management services today including:  1. CCM service includes personalized support from designated clinical staff supervised by her physician, including individualized plan of care and coordination with other care providers 2. 24/7 contact phone numbers for assistance for urgent and routine care needs. 3. Service will only be billed when office clinical staff spend 20 minutes or more in a month to coordinate care. 4. Only one practitioner may furnish and bill the service in a calendar month. 5. The patient may stop CCM services at any time (effective at the end of the month) by phone call to the office staff. 6. The patient will be responsible for cost sharing (co-pay) of up to 20% of the service fee (after annual deductible is met).   Patient did not agree to services and does not wish to consider at this time.   Follow Up Plan: Client will notify her provider if she would like to participate in CCM services in the future.   Daneen Schick, BSW, CDP [facility] Care Management Social Worker [phone]

## 2018-03-24 NOTE — Patient Instructions (Signed)
Social Worker Visit Information    Materials provided: Verbal education about CCM services provided by phone  Ms. Holm was given information about Chronic Care Management services today including:  1. CCM service includes personalized support from designated clinical staff supervised by her physician, including individualized plan of care and coordination with other care providers 2. 24/7 contact phone numbers for assistance for urgent and routine care needs. 3. Service will only be billed when office clinical staff spend 20 minutes or more in a month to coordinate care. 4. Only one practitioner may furnish and bill the service in a calendar month. 5. The patient may stop CCM services at any time (effective at the end of the month) by phone call to the office staff. 6. The patient will be responsible for cost sharing (co-pay) of up to 20% of the service fee (after annual deductible is met).  Patient did not agree to services and does not wish to consider at this time.  The patient verbalized understanding of instructions provided today and declined a print copy of patient instruction materials.   Follow up plan: Client will notify her provider if future CCM services are desired.   Daneen Schick, BSW, CDP [facility] Care Management Social Worker [phone]

## 2018-04-09 LAB — HM DIABETES EYE EXAM

## 2018-04-20 ENCOUNTER — Telehealth: Payer: Self-pay | Admitting: Nurse Practitioner

## 2018-04-20 NOTE — Telephone Encounter (Signed)
PT CAME IN REQ REFILL

## 2018-04-21 ENCOUNTER — Other Ambulatory Visit: Payer: Self-pay

## 2018-04-21 MED ORDER — SIMVASTATIN 40 MG PO TABS: 40.0000 mg | ORAL_TABLET | Freq: Every day | ORAL | 0 refills | Status: DC

## 2018-05-04 ENCOUNTER — Encounter: Payer: Self-pay | Admitting: Nurse Practitioner

## 2018-05-28 ENCOUNTER — Other Ambulatory Visit: Payer: Self-pay

## 2018-05-28 DIAGNOSIS — E119 Type 2 diabetes mellitus without complications: Secondary | ICD-10-CM

## 2018-05-28 MED ORDER — SIMVASTATIN 40 MG PO TABS: 40.0000 mg | ORAL_TABLET | Freq: Every day | ORAL | 0 refills | Status: DC

## 2018-05-28 MED ORDER — METFORMIN HCL ER 500 MG PO TB24: 1000.0000 mg | ORAL_TABLET | Freq: Two times a day (BID) | ORAL | 0 refills | Status: DC

## 2018-05-28 MED ORDER — GLIMEPIRIDE 4 MG PO TABS: ORAL_TABLET | ORAL | 1 refills | Status: DC

## 2018-06-03 ENCOUNTER — Encounter: Payer: Self-pay | Admitting: Nurse Practitioner

## 2018-06-03 ENCOUNTER — Ambulatory Visit (INDEPENDENT_AMBULATORY_CARE_PROVIDER_SITE_OTHER): Payer: BLUE CROSS/BLUE SHIELD | Admitting: Nurse Practitioner

## 2018-06-03 ENCOUNTER — Other Ambulatory Visit: Payer: Self-pay

## 2018-06-03 DIAGNOSIS — E119 Type 2 diabetes mellitus without complications: Secondary | ICD-10-CM

## 2018-06-03 DIAGNOSIS — E782 Mixed hyperlipidemia: Secondary | ICD-10-CM | POA: Diagnosis not present

## 2018-06-03 MED ORDER — GLIMEPIRIDE 4 MG PO TABS: ORAL_TABLET | ORAL | 1 refills | Status: DC

## 2018-06-03 MED ORDER — SIMVASTATIN 40 MG PO TABS: 40.0000 mg | ORAL_TABLET | Freq: Every day | ORAL | 1 refills | Status: DC

## 2018-06-03 MED ORDER — METFORMIN HCL ER (MOD) 1000 MG PO TB24: 1000.0000 mg | ORAL_TABLET | Freq: Two times a day (BID) | ORAL | 1 refills | Status: DC

## 2018-06-03 NOTE — Progress Notes (Signed)
Virtual Visit via telephone   This visit type was conducted due to national recommendations for restrictions regarding the COVID-19 Pandemic (e.g. social distancing) in an effort to limit this patient's exposure and mitigate transmission in our community.  Patients identity confirmed using two different identifiers.  This format is felt to be most appropriate for this patient at this time.  All issues noted in this document were discussed and addressed.  No physical exam was performed (except for noted visual exam findings with Video Visits).    Date:  06/17/2018   ID:  Brittany Klein, DOB 10-Sep-1955, MRN [id number]  Patient Location:  Home - spoke with Brittany Klein  Provider location:   Office    Chief Complaint:  Follow up diabetes  History of Present Illness:    Brittany Klein is a 63 y.o. female who presents via video conferencing for a telehealth visit today.    The patient does not have symptoms concerning for COVID-19 infection (fever, chills, cough, or new shortness of breath).   She is scheduled for her colonoscopy.    Diabetes  She presents for her follow-up diabetic visit. She has type 2 diabetes mellitus. Her disease course has been stable. There are no hypoglycemic associated symptoms. Pertinent negatives for hypoglycemia include no dizziness. There are no diabetic associated symptoms. Pertinent negatives for diabetes include no blurred vision and no chest pain. Symptoms are stable. She is following a generally healthy diet. When asked about meal planning, she reported none. She has not had a previous visit with a dietitian. She rarely participates in exercise. Her overall blood glucose range is 140-180 mg/dl. She does not see a podiatrist.Eye exam is not current.     Past Medical History:  Diagnosis Date  . Anemia   . Arthritis    "knees, hands" (08/04/2012)  . Chronic lower back pain    "all the time" (08/04/2012)  . Dysrhythmia    Tachycardia, comes and goes.  Increases with activity  . History of blood transfusion 2004   "related to knee replacement" (08/04/2012)  . Hyperlipemia   . Type II diabetes mellitus (HCC)    Past Surgical History:  Procedure Laterality Date  . CARPAL TUNNEL RELEASE Left 1990's  . HERNIA REPAIR  2000   umbilical  . JOINT REPLACEMENT    . TOTAL KNEE ARTHROPLASTY Left 10/2002  . TOTAL KNEE REVISION Left 08/04/2012  . TOTAL KNEE REVISION Left 08/04/2012   Procedure: LEFT TOTAL KNEE REVISION;  Surgeon: Cammy Copa, MD;  Location[facility] OR;  Service: Orthopedics;  Laterality: Left;  . TUBAL LIGATION  1980     Current Meds  Medication Sig  . aspirin EC 81 MG tablet Take 81 mg by mouth daily.  . cyanocobalamin 1000 MCG tablet Take 1,000 mcg by mouth daily.  Marland Kitchen glimepiride (AMARYL) 4 MG tablet Take 1 tab in am and 1/2 tab in evening  . simvastatin (ZOCOR) 40 MG tablet Take 1 tablet (40 mg total) by mouth daily.  . [DISCONTINUED] glimepiride (AMARYL) 4 MG tablet Take 1 1/2 tab daily  . [DISCONTINUED] metFORMIN (GLUCOPHAGE-XR) 500 MG 24 hr tablet Take 2 tablets (1,000 mg total) by mouth 2 (two) times daily.  . [DISCONTINUED] simvastatin (ZOCOR) 40 MG tablet Take 1 tablet (40 mg total) by mouth daily.     Allergies:   Morphine and related   Social History   Tobacco Use  . Smoking status: Former Smoker    Packs/day: 1.00    Years:  23.00    Pack years: 23.00    Types: Cigarettes    Last attempt to quit: 12/01/1998    Years since quitting: 19.5  . Smokeless tobacco: Never Used  Substance Use Topics  . Alcohol use: Yes    Comment: 08/05/2012 "don't drink anymore; stopped ~ 11/1998; never had problem w/it"  . Drug use: No     Family Hx: The patient's family history includes Cancer in her mother and sister; Diabetes in her father, mother, and sister; Heart disease in her sister.  ROS:   Please see the history of present illness.    Review of Systems  Constitutional: Negative.   Eyes: Negative for blurred  vision and photophobia.  Cardiovascular: Negative.  Negative for chest pain and palpitations.  Genitourinary: Negative.  Negative for dysuria.  Neurological: Negative for dizziness and tingling.    All other systems reviewed and are negative.   Labs/Other Tests and Data Reviewed:    Recent Labs: 06/04/2018: ALT 21; BUN 16; Creatinine, Ser 0.83; Potassium 4.5; Sodium 140   Recent Lipid Panel Lab Results  Component Value Date/Time   CHOL 167 08/17/2009 08:14 PM   TRIG 146 08/17/2009 08:14 PM   HDL 42 08/17/2009 08:14 PM   CHOLHDL 4.0 Ratio 08/17/2009 08:14 PM   LDLCALC 96 08/17/2009 08:14 PM    Wt Readings from Last 3 Encounters:  03/04/18 215 lb 12.8 oz (97.9 kg)  09/02/17 208 lb (94.3 kg)  04/16/17 208 lb (94.3 kg)     Exam:    Vital Signs:  There were no vitals taken for this visit.    Physical Exam  Constitutional: She is oriented to person, place, and time and well-developed, well-nourished, and in no distress.  Pulmonary/Chest: Effort normal.  Neurological: She is alert and oriented to person, place, and time.  Psychiatric: Mood, memory, affect and judgment normal.    ASSESSMENT & PLAN:     1. Type 2 diabetes mellitus without complication, without long-term current use of insulin (HCC)  Chronic, fair control  Continue with current medications  Encouraged to limit intake of sugary foods and drinks  Encouraged to increase physical activity to 150 minutes per week - simvastatin (ZOCOR) 40 MG tablet; Take 1 tablet (40 mg total) by mouth daily.  Dispense: 90 tablet; Refill: 1 - glimepiride (AMARYL) 4 MG tablet; Take 1 tab in am and 1/2 tab in evening  Dispense: 135 tablet; Refill: 1 - metFORMIN (GLUMETZA) 1000 MG (MOD) 24 hr tablet; Take 1 tablet (1,000 mg total) by mouth 2 (two) times daily with a meal.  Dispense: 180 tablet; Refill: 1 - Hemoglobin A1c; Future - CMP14 + Anion Gap; Future  2. Mixed hyperlipidemia  Chronic, controlled  Continue with current  medications  Negative for muscle aches - simvastatin (ZOCOR) 40 MG tablet; Take 1 tablet (40 mg total) by mouth daily.  Dispense: 90 tablet; Refill: 1   COVID-19 Education: The signs and symptoms of COVID-19 were discussed with the patient and how to seek care for testing (follow up with PCP or arrange E-visit).  The importance of social distancing was discussed today.  Patient Risk:   After full review of this patients clinical status, I feel that they are at least moderate risk at this time.  Time:   Today, I have spent 11 minutes/ seconds with the patient with telehealth technology discussing above diagnoses.     Medication Adjustments/Labs and Tests Ordered: Current medicines are reviewed at length with the patient today.  Concerns  regarding medicines are outlined above.   Tests Ordered: Orders Placed This Encounter  Procedures  . Hemoglobin A1c  . CMP14 + Anion Gap    Medication Changes: Meds ordered this encounter  Medications  . simvastatin (ZOCOR) 40 MG tablet    Sig: Take 1 tablet (40 mg total) by mouth daily.    Dispense:  90 tablet    Refill:  1  . glimepiride (AMARYL) 4 MG tablet    Sig: Take 1 tab in am and 1/2 tab in evening    Dispense:  135 tablet    Refill:  1    Patient needs an appointment  . metFORMIN (GLUMETZA) 1000 MG (MOD) 24 hr tablet    Sig: Take 1 tablet (1,000 mg total) by mouth 2 (two) times daily with a meal.    Dispense:  180 tablet    Refill:  1    Disposition:  Follow up in 3 month(s)  Signed, Arnette Felts, FNP

## 2018-06-04 ENCOUNTER — Other Ambulatory Visit: Payer: Self-pay

## 2018-06-04 ENCOUNTER — Other Ambulatory Visit: Payer: BLUE CROSS/BLUE SHIELD

## 2018-06-04 DIAGNOSIS — E119 Type 2 diabetes mellitus without complications: Secondary | ICD-10-CM

## 2018-06-05 LAB — CMP14 + ANION GAP
ALT: 21 IU/L (ref 0–32)
AST: 24 IU/L (ref 0–40)
Albumin/Globulin Ratio: 1.5 (ref 1.2–2.2)
Albumin: 4.4 g/dL (ref 3.8–4.8)
Alkaline Phosphatase: 85 IU/L (ref 39–117)
Anion Gap: 16 mmol/L (ref 10.0–18.0)
BUN/Creatinine Ratio: 19 (ref 12–28)
BUN: 16 mg/dL (ref 8–27)
Bilirubin Total: 0.3 mg/dL (ref 0.0–1.2)
CO2: 25 mmol/L (ref 20–29)
Calcium: 9.8 mg/dL (ref 8.7–10.3)
Chloride: 99 mmol/L (ref 96–106)
Creatinine, Ser: 0.83 mg/dL (ref 0.57–1.00)
GFR calc Af Amer: 88 mL/min/{1.73_m2} (ref >59)
GFR calc non Af Amer: 76 mL/min/{1.73_m2} (ref >59)
Globulin, Total: 2.9 g/dL (ref 1.5–4.5)
Glucose: 79 mg/dL (ref 65–99)
Potassium: 4.5 mmol/L (ref 3.5–5.2)
Sodium: 140 mmol/L (ref 134–144)
Total Protein: 7.3 g/dL (ref 6.0–8.5)

## 2018-06-05 LAB — HEMOGLOBIN A1C
Est. average glucose Bld gHb Est-mCnc: 180 mg/dL
Hgb A1c MFr Bld: 7.9 % — ABNORMAL HIGH (ref 4.8–5.6)

## 2018-06-17 ENCOUNTER — Telehealth: Payer: Self-pay

## 2018-06-17 NOTE — Telephone Encounter (Signed)
Left message for pt to call back for lab results and pt was approved for metformin $20 for 90 days supply

## 2018-06-17 NOTE — Telephone Encounter (Signed)
-----   Message from Arnette Felts, FNP sent at 06/17/2018 12:25 AM EDT ----- Kidney and liver functions are normal.  HgbA1c is up to 7.9 are you doing anything different.  I would like to add a medication called Rybelsus it is a pill to take daily this will help your HgbA1c to improve otherwise you are getting to close to needing insulin.

## 2018-06-24 ENCOUNTER — Encounter: Payer: Self-pay | Admitting: Nurse Practitioner

## 2018-06-24 LAB — HM COLONOSCOPY

## 2018-07-23 ENCOUNTER — Encounter: Payer: BLUE CROSS/BLUE SHIELD | Admitting: Nurse Practitioner

## 2018-09-07 ENCOUNTER — Encounter: Payer: BC Managed Care – PPO | Admitting: Nurse Practitioner

## 2018-09-09 ENCOUNTER — Ambulatory Visit: Payer: BLUE CROSS/BLUE SHIELD | Admitting: Nurse Practitioner

## 2018-09-22 ENCOUNTER — Ambulatory Visit: Payer: BC Managed Care – PPO | Admitting: Nurse Practitioner

## 2018-09-30 ENCOUNTER — Encounter: Payer: Self-pay | Admitting: Nurse Practitioner

## 2018-09-30 ENCOUNTER — Other Ambulatory Visit: Payer: Self-pay

## 2018-09-30 ENCOUNTER — Ambulatory Visit: Payer: BC Managed Care – PPO | Admitting: Nurse Practitioner

## 2018-09-30 VITALS — BP 132/80 | HR 71 | Temp 98.7°F | Ht 64.2 in | Wt 208.2 lb

## 2018-09-30 DIAGNOSIS — E119 Type 2 diabetes mellitus without complications: Secondary | ICD-10-CM | POA: Diagnosis not present

## 2018-09-30 DIAGNOSIS — E782 Mixed hyperlipidemia: Secondary | ICD-10-CM | POA: Diagnosis not present

## 2018-09-30 DIAGNOSIS — Z23 Encounter for immunization: Secondary | ICD-10-CM

## 2018-09-30 DIAGNOSIS — Z1159 Encounter for screening for other viral diseases: Secondary | ICD-10-CM

## 2018-09-30 DIAGNOSIS — E1140 Type 2 diabetes mellitus with diabetic neuropathy, unspecified: Secondary | ICD-10-CM

## 2018-09-30 LAB — POCT UA - MICROALBUMIN
Albumin/Creatinine Ratio, Urine, POC: 30
Creatinine, POC: 200 mg/dL
Microalbumin Ur, POC: 10 mg/L

## 2018-09-30 NOTE — Progress Notes (Signed)
Subjective:     Patient ID: Brittany Klein , female    DOB: 07-Apr-1955 , 63 y.o.   MRN: [id number]   Chief Complaint  Patient presents with  . Diabetes    HPI  Diabetes She presents for her follow-up diabetic visit. She has type 2 diabetes mellitus. Her disease course has been stable. There are no hypoglycemic associated symptoms. Pertinent negatives for hypoglycemia include no dizziness or headaches. There are no diabetic associated symptoms. There are no hypoglycemic complications. Symptoms are stable. There are no diabetic complications. Risk factors for coronary artery disease include sedentary lifestyle and obesity. Current diabetic treatment includes oral agent (dual therapy).     Past Medical History:  Diagnosis Date  . Anemia   . Arthritis    "knees, hands" (08/04/2012)  . Chronic lower back pain    "all the time" (08/04/2012)  . Dysrhythmia    Tachycardia, comes and goes. Increases with activity  . History of blood transfusion 2004   "related to knee replacement" (08/04/2012)  . Hyperlipemia   . Type II diabetes mellitus (HCC)      Family History  Problem Relation Age of Onset  . Diabetes Mother   . Cancer Mother   . Diabetes Father   . Cancer Sister   . Heart disease Sister   . Diabetes Sister      Current Outpatient Medications:  .  aspirin EC 81 MG tablet, Take 81 mg by mouth daily., Disp: , Rfl:  .  cyanocobalamin 1000 MCG tablet, Take 1,000 mcg by mouth daily., Disp: , Rfl:  .  glimepiride (AMARYL) 4 MG tablet, Take 1 tab in am and 1/2 tab in evening, Disp: 135 tablet, Rfl: 1 .  metFORMIN (GLUMETZA) 1000 MG (MOD) 24 hr tablet, Take 1 tablet (1,000 mg total) by mouth 2 (two) times daily with a meal., Disp: 180 tablet, Rfl: 1 .  simvastatin (ZOCOR) 40 MG tablet, Take 1 tablet (40 mg total) by mouth daily., Disp: 90 tablet, Rfl: 1   Allergies  Allergen Reactions  . Morphine And Related Itching     Review of Systems  Constitutional: Negative.    Respiratory: Negative.   Cardiovascular: Negative.   Musculoskeletal: Negative.   Neurological: Negative for dizziness and headaches.  Psychiatric/Behavioral: Negative.      Today's Vitals   09/30/18 1057  BP: 132/80  Pulse: 71  Temp: 98.7 F (37.1 C)  TempSrc: Oral  Weight: 208 lb 3.2 oz (94.4 kg)  Height: 5' 4.2" (1.631 m)  PainSc: 0-No pain   Body mass index is 35.52 kg/m.   Objective:  Physical Exam Constitutional:      Appearance: Normal appearance.  Cardiovascular:     Rate and Rhythm: Normal rate and regular rhythm.     Pulses: Normal pulses.     Heart sounds: Normal heart sounds. No murmur.  Pulmonary:     Effort: Pulmonary effort is normal.     Breath sounds: Normal breath sounds.  Skin:    General: Skin is warm and dry.     Capillary Refill: Capillary refill takes less than 2 seconds.  Neurological:     General: No focal deficit present.     Mental Status: She is alert and oriented to person, place, and time.         Assessment And Plan:     1. Type 2 diabetes mellitus without complication, without long-term current use of insulin (HCC) Chronic, increasing HgbA1c she relates to eating doughnuts.  Continue  with current medications, will consider Rybelsus pending lab results Encouraged to limit intake of sugary foods and drinks Encouraged to increase physical activity to 150 minutes per week, encouraged to get a recumbent bike or water aerobics due to her knee pain and need for exercise. - Hemoglobin A1c - BMP8+eGFR  2. Mixed hyperlipidemia  Chronic, controlled  Continue with current medications - Lipid panel  3. Need for influenza vaccination  Influenza vaccine given in office  Advised to take Tylenol as needed for muscle aches or fever - Flu Vaccine QUAD 6+ mos PF IM (Fluarix Quad PF)   Minette Brine, FNP    THE PATIENT IS ENCOURAGED TO PRACTICE SOCIAL DISTANCING DUE TO THE COVID-19 PANDEMIC.

## 2018-09-30 NOTE — Patient Instructions (Signed)
Influenza Virus Vaccine (Flucelvax) What is this medicine? INFLUENZA VIRUS VACCINE (in floo EN zuh VAHY ruhs vak SEEN) helps to reduce the risk of getting influenza also known as the flu. The vaccine only helps protect you against some strains of the flu. This medicine may be used for other purposes; ask your health care provider or pharmacist if you have questions. COMMON BRAND NAME(S): FLUCELVAX What should I tell my health care provider before I take this medicine? They need to know if you have any of these conditions:  bleeding disorder like hemophilia  fever or infection  Guillain-Barre syndrome or other neurological problems  immune system problems  infection with the human immunodeficiency virus (HIV) or AIDS  low blood platelet counts  multiple sclerosis  an unusual or allergic reaction to influenza virus vaccine, other medicines, foods, dyes or preservatives  pregnant or trying to get pregnant  breast-feeding How should I use this medicine? This vaccine is for injection into a muscle. It is given by a health care professional. A copy of Vaccine Information Statements will be given before each vaccination. Read this sheet carefully each time. The sheet may change frequently. Talk to your pediatrician regarding the use of this medicine in children. Special care may be needed. Overdosage: If you think you've taken too much of this medicine contact a poison control center or emergency room at once. Overdosage: If you think you have taken too much of this medicine contact a poison control center or emergency room at once. NOTE: This medicine is only for you. Do not share this medicine with others. What if I miss a dose? This does not apply. What may interact with this medicine?  chemotherapy or radiation therapy  medicines that lower your immune system like etanercept, anakinra, infliximab, and adalimumab  medicines that treat or prevent blood clots like  warfarin  phenytoin  steroid medicines like prednisone or cortisone  theophylline  vaccines This list may not describe all possible interactions. Give your health care provider a list of all the medicines, herbs, non-prescription drugs, or dietary supplements you use. Also tell them if you smoke, drink alcohol, or use illegal drugs. Some items may interact with your medicine. What should I watch for while using this medicine? Report any side effects that do not go away within 3 days to your doctor or health care professional. Call your health care provider if any unusual symptoms occur within 6 weeks of receiving this vaccine. You may still catch the flu, but the illness is not usually as bad. You cannot get the flu from the vaccine. The vaccine will not protect against colds or other illnesses that may cause fever. The vaccine is needed every year. What side effects may I notice from receiving this medicine? Side effects that you should report to your doctor or health care professional as soon as possible:  allergic reactions like skin rash, itching or hives, swelling of the face, lips, or tongue Side effects that usually do not require medical attention (Report these to your doctor or health care professional if they continue or are bothersome.):  fever  headache  muscle aches and pains  pain, tenderness, redness, or swelling at the injection site  tiredness This list may not describe all possible side effects. Call your doctor for medical advice about side effects. You may report side effects to FDA at 1-800-FDA-1088. Where should I keep my medicine? The vaccine will be given by a health care professional in a clinic, pharmacy, doctor's   office, or other health care setting. You will not be given vaccine doses to store at home. NOTE: This sheet is a summary. It may not cover all possible information. If you have questions about this medicine, talk to your doctor, pharmacist, or  health care provider.  2020 Elsevier/Gold Standard (2010-12-19 14:06:47)  

## 2018-10-01 LAB — LIPID PANEL
Chol/HDL Ratio: 2.5 ratio (ref 0.0–4.4)
Cholesterol, Total: 160 mg/dL (ref 100–199)
HDL: 65 mg/dL (ref >39)
LDL Chol Calc (NIH): 83 mg/dL (ref 0–99)
Triglycerides: 59 mg/dL (ref 0–149)
VLDL Cholesterol Cal: 12 mg/dL (ref 5–40)

## 2018-10-01 LAB — BMP8+EGFR
BUN/Creatinine Ratio: 17 (ref 12–28)
BUN: 15 mg/dL (ref 8–27)
CO2: 25 mmol/L (ref 20–29)
Calcium: 9.5 mg/dL (ref 8.7–10.3)
Chloride: 104 mmol/L (ref 96–106)
Creatinine, Ser: 0.9 mg/dL (ref 0.57–1.00)
GFR calc Af Amer: 79 mL/min/{1.73_m2} (ref >59)
GFR calc non Af Amer: 69 mL/min/{1.73_m2} (ref >59)
Glucose: 87 mg/dL (ref 65–99)
Potassium: 4.7 mmol/L (ref 3.5–5.2)
Sodium: 143 mmol/L (ref 134–144)

## 2018-10-01 LAB — HEMOGLOBIN A1C
Est. average glucose Bld gHb Est-mCnc: 143 mg/dL
Hgb A1c MFr Bld: 6.6 % — ABNORMAL HIGH (ref 4.8–5.6)

## 2018-10-01 LAB — HEPATITIS C ANTIBODY: Hep C Virus Ab: <0.1 s/co ratio (ref 0.0–0.9)

## 2018-12-28 ENCOUNTER — Ambulatory Visit: Payer: BC Managed Care – PPO | Admitting: Nurse Practitioner

## 2018-12-28 ENCOUNTER — Other Ambulatory Visit: Payer: Self-pay

## 2018-12-28 ENCOUNTER — Encounter: Payer: Self-pay | Admitting: Nurse Practitioner

## 2018-12-28 VITALS — BP 116/80 | HR 76 | Temp 98.1°F | Ht 64.2 in | Wt 207.6 lb

## 2018-12-28 DIAGNOSIS — Z114 Encounter for screening for human immunodeficiency virus [HIV]: Secondary | ICD-10-CM

## 2018-12-28 DIAGNOSIS — Z0000 Encounter for general adult medical examination without abnormal findings: Secondary | ICD-10-CM

## 2018-12-28 DIAGNOSIS — E119 Type 2 diabetes mellitus without complications: Secondary | ICD-10-CM | POA: Diagnosis not present

## 2018-12-28 DIAGNOSIS — Z23 Encounter for immunization: Secondary | ICD-10-CM

## 2018-12-28 DIAGNOSIS — E782 Mixed hyperlipidemia: Secondary | ICD-10-CM

## 2018-12-28 DIAGNOSIS — Z139 Encounter for screening, unspecified: Secondary | ICD-10-CM

## 2018-12-28 DIAGNOSIS — R252 Cramp and spasm: Secondary | ICD-10-CM

## 2018-12-28 LAB — POCT URINALYSIS DIPSTICK
Bilirubin, UA: NEGATIVE
Blood, UA: NEGATIVE
Glucose, UA: NEGATIVE
Ketones, UA: NEGATIVE
Leukocytes, UA: NEGATIVE
Nitrite, UA: NEGATIVE
Protein, UA: NEGATIVE
Spec Grav, UA: 1.025 (ref 1.010–1.025)
Urobilinogen, UA: 0.2 E.U./dL
pH, UA: 5 (ref 5.0–8.0)

## 2018-12-28 MED ORDER — GLIMEPIRIDE 4 MG PO TABS: ORAL_TABLET | ORAL | 1 refills | Status: DC

## 2018-12-28 MED ORDER — METFORMIN HCL ER (MOD) 1000 MG PO TB24: 1000.0000 mg | ORAL_TABLET | Freq: Two times a day (BID) | ORAL | 1 refills | Status: DC

## 2018-12-28 MED ORDER — MAGNESIUM 200 MG PO TABS: ORAL_TABLET | ORAL | 1 refills | Status: DC

## 2018-12-28 MED ORDER — SIMVASTATIN 40 MG PO TABS: 40.0000 mg | ORAL_TABLET | Freq: Every day | ORAL | 1 refills | Status: DC

## 2018-12-28 NOTE — Progress Notes (Signed)
Subjective:     Patient ID: Brittany Klein , female    DOB: 04/19/1955 , 63 y.o.   MRN: [id number]   Chief Complaint  Patient presents with  . Annual Exam    HPI  Here for HM  Wt Readings from Last 3 Encounters: 12/28/18 : 207 lb 9.6 oz (94.2 kg) 09/30/18 : 208 lb 3.2 oz (94.4 kg) 03/04/18 : 215 lb 12.8 oz (97.9 kg)   Diabetes She presents for her follow-up diabetic visit. She has type 2 diabetes mellitus. Her disease course has been stable. There are no hypoglycemic associated symptoms. Pertinent negatives for hypoglycemia include no dizziness or headaches. There are no diabetic associated symptoms. There are no hypoglycemic complications. Symptoms are stable. There are no diabetic complications. Risk factors for coronary artery disease include sedentary lifestyle and obesity. Current diabetic treatment includes oral agent (dual therapy). (140-150 average blood sugar)    The patient states she is status post hysterectomy.  Mammogram last done by Dr. Vickey SagesAtkins per patient at the office. Due in January 2021.  Negative for: breast discharge, breast lump(s), breast pain and breast self exam.  Pertinent negatives include abnormal bleeding (hematology), anxiety, decreased libido, depression, difficulty falling sleep, dyspareunia, history of infertility, nocturia, sexual dysfunction, sleep disturbances, urinary incontinence, urinary urgency, vaginal discharge and vaginal itching. Diet regular. The patient states her exercise level is minimal, she is walking the hall at work.     The patient's tobacco use is:  Social History   Tobacco Use  Smoking Status Former Smoker  . Packs/day: 1.00  . Years: 23.00  . Pack years: 23.00  . Types: Cigarettes  . Quit date: 12/01/1998  . Years since quitting: 20.0  Smokeless Tobacco Never Used   She has been exposed to passive smoke. The patient's alcohol use is:  Social History   Substance and Sexual Activity  Alcohol Use Yes   Comment: 08/05/2012  "don't drink anymore; stopped ~ 11/1998; never had problem w/it"   Additional information: Last pap maybe 2 years ago per patient.    Past Medical History:  Diagnosis Date  . Anemia   . Arthritis    "knees, hands" (08/04/2012)  . Chronic lower back pain    "all the time" (08/04/2012)  . Dysrhythmia    Tachycardia, comes and goes. Increases with activity  . History of blood transfusion 2004   "related to knee replacement" (08/04/2012)  . Hyperlipemia   . Type II diabetes mellitus (HCC)      Family History  Problem Relation Age of Onset  . Diabetes Mother   . Cancer Mother   . Diabetes Father   . Cancer Sister   . Heart disease Sister   . Diabetes Sister      Current Outpatient Medications:  .  aspirin EC 81 MG tablet, Take 81 mg by mouth daily., Disp: , Rfl:  .  cyanocobalamin 1000 MCG tablet, Take 1,000 mcg by mouth daily., Disp: , Rfl:  .  glimepiride (AMARYL) 4 MG tablet, Take 1 tab in am and 1/2 tab in evening, Disp: 135 tablet, Rfl: 1 .  metFORMIN (GLUMETZA) 1000 MG (MOD) 24 hr tablet, Take 1 tablet (1,000 mg total) by mouth 2 (two) times daily with a meal., Disp: 180 tablet, Rfl: 1 .  simvastatin (ZOCOR) 40 MG tablet, Take 1 tablet (40 mg total) by mouth daily., Disp: 90 tablet, Rfl: 1   Allergies  Allergen Reactions  . Morphine And Related Itching     Review of  Systems  Constitutional: Negative.   HENT: Negative.   Eyes: Negative.   Respiratory: Negative.   Cardiovascular: Negative.   Gastrointestinal: Negative.   Endocrine: Negative.   Genitourinary: Negative.   Musculoskeletal: Positive for myalgias.  Skin: Negative.   Allergic/Immunologic: Negative.   Neurological: Negative.  Negative for dizziness and headaches.  Hematological: Negative.   Psychiatric/Behavioral: Negative.      Today's Vitals   12/28/18 1106  BP: 116/80  Pulse: 76  Temp: 98.1 F (36.7 C)  TempSrc: Oral  Weight: 207 lb 9.6 oz (94.2 kg)  Height: 5' 4.2" (1.631 m)  PainSc: 0-No  pain   Body mass index is 35.41 kg/m.   Objective:  Physical Exam Constitutional:      Appearance: Normal appearance. She is well-developed.  HENT:     Head: Normocephalic and atraumatic.     Right Ear: Hearing, tympanic membrane, ear canal and external ear normal.     Left Ear: Hearing, tympanic membrane, ear canal and external ear normal.     Nose: Nose normal.     Mouth/Throat:     Mouth: Mucous membranes are moist.  Eyes:     General: Lids are normal.     Extraocular Movements: Extraocular movements intact.     Conjunctiva/sclera: Conjunctivae normal.     Pupils: Pupils are equal, round, and reactive to light.     Funduscopic exam:    Right eye: No papilledema.        Left eye: No papilledema.  Neck:     Musculoskeletal: Full passive range of motion without pain, normal range of motion and neck supple.     Thyroid: No thyroid mass.     Vascular: No carotid bruit.  Cardiovascular:     Rate and Rhythm: Normal rate and regular rhythm.     Pulses: Normal pulses.     Heart sounds: Normal heart sounds. No murmur.  Pulmonary:     Effort: Pulmonary effort is normal.     Breath sounds: Normal breath sounds.  Abdominal:     General: Abdomen is flat. Bowel sounds are normal. There is no distension.     Palpations: Abdomen is soft.  Musculoskeletal: Normal range of motion.        General: No swelling.     Right lower leg: No edema.     Left lower leg: No edema.  Skin:    General: Skin is warm and dry.     Capillary Refill: Capillary refill takes less than 2 seconds.  Neurological:     General: No focal deficit present.     Mental Status: She is alert and oriented to person, place, and time.     Cranial Nerves: No cranial nerve deficit.     Sensory: No sensory deficit.  Psychiatric:        Mood and Affect: Mood normal.        Behavior: Behavior normal.        Thought Content: Thought content normal.        Judgment: Judgment normal.         Assessment And Plan:      1. Encounter for general adult medical examination w/o abnormal findings . Behavior modifications discussed and diet history reviewed.   . Pt will continue to exercise regularly and modify diet with low GI, plant based foods and decrease intake of processed foods.  . Recommend intake of daily multivitamin, Vitamin D, and calcium.  . Recommend mammogram (reports done at GYN, request records)  and colonoscopy for preventive screenings, as well as recommend immunizations that include influenza, TDAP - POCT Urinalysis Dipstick (81002)  2. Encounter for screening  - HIV antibody (with reflex)  3. Encounter for immunization  - Pneumococcal polysaccharide vaccine 23-valent greater than or equal to 2yo subcutaneous/IM  4. Type 2 diabetes mellitus without complication, without long-term current use of insulin (HCC)  Chronic, slowly improving  Continue with current medications  Encouraged to limit intake of sugary foods and drinks  Encouraged to increase physical activity to 150 minutes per week as tolerated - glimepiride (AMARYL) 4 MG tablet; Take 1 tab in am and 1/2 tab in evening  Dispense: 135 tablet; Refill: 1 - metFORMIN (GLUMETZA) 1000 MG (MOD) 24 hr tablet; Take 1 tablet (1,000 mg total) by mouth 2 (two) times daily with a meal.  Dispense: 180 tablet; Refill: 1 - simvastatin (ZOCOR) 40 MG tablet; Take 1 tablet (40 mg total) by mouth daily.  Dispense: 90 tablet; Refill: 1  5. Mixed hyperlipidemia  Chronic, controlled  Continue with current medications  No myalgias - simvastatin (ZOCOR) 40 MG tablet; Take 1 tablet (40 mg total) by mouth daily.  Dispense: 90 tablet; Refill: 1  6. Leg cramps  Intermittent muscle cramps mainly at night  Advised to take 200 mg with evening meal and to be sure staying well hydrated with water - Magnesium - Magnesium 200 MG TABS; Take 1 tablet daily with evening meal  Dispense: 30 tablet; Refill: 1    Minette Brine, FNP    THE PATIENT IS  ENCOURAGED TO PRACTICE SOCIAL DISTANCING DUE TO THE COVID-19 PANDEMIC.

## 2018-12-28 NOTE — Patient Instructions (Addendum)
Health Maintenance  Topic Date Due   PAP SMEAR-Modifier  08/24/1977   MAMMOGRAM  09/17/2017   HEMOGLOBIN A1C  03/30/2019   OPHTHALMOLOGY EXAM  04/09/2019   FOOT EXAM  09/30/2019   URINE MICROALBUMIN  09/30/2019   TETANUS/TDAP  07/19/2024   COLONOSCOPY  06/23/2028   INFLUENZA VACCINE  Completed   PNEUMOCOCCAL POLYSACCHARIDE VACCINE AGE 62-64 HIGH RISK  Completed   Hepatitis C Screening  Completed   HIV Screening  Completed   Health Maintenance, Female Adopting a healthy lifestyle and getting preventive care are important in promoting health and wellness. Ask your health care provider about:  The right schedule for you to have regular tests and exams.  Things you can do on your own to prevent diseases and keep yourself healthy. What should I know about diet, weight, and exercise? Eat a healthy diet   Eat a diet that includes plenty of vegetables, fruits, low-fat dairy products, and lean protein.  Do not eat a lot of foods that are high in solid fats, added sugars, or sodium. Maintain a healthy weight Body mass index (BMI) is used to identify weight problems. It estimates body fat based on height and weight. Your health care provider can help determine your BMI and help you achieve or maintain a healthy weight. Get regular exercise Get regular exercise. This is one of the most important things you can do for your health. Most adults should:  Exercise for at least 150 minutes each week. The exercise should increase your heart rate and make you sweat (moderate-intensity exercise).  Do strengthening exercises at least twice a week. This is in addition to the moderate-intensity exercise.  Spend less time sitting. Even light physical activity can be beneficial. Watch cholesterol and blood lipids Have your blood tested for lipids and cholesterol at 62 years of age, then have this test every 5 years. Have your cholesterol levels checked more often if:  Your lipid or  cholesterol levels are high.  You are older than 62 years of age.  You are at high risk for heart disease. What should I know about cancer screening? Depending on your health history and family history, you may need to have cancer screening at various ages. This may include screening for:  Breast cancer.  Cervical cancer.  Colorectal cancer.  Skin cancer.  Lung cancer. What should I know about heart disease, diabetes, and high blood pressure? Blood pressure and heart disease  High blood pressure causes heart disease and increases the risk of stroke. This is more likely to develop in people who have high blood pressure readings, are of African descent, or are overweight.  Have your blood pressure checked: ? Every 3-5 years if you are 63-33 years of age. ? Every year if you are 15 years old or older. Diabetes Have regular diabetes screenings. This checks your fasting blood sugar level. Have the screening done:  Once every three years after age 13 if you are at a normal weight and have a low risk for diabetes.  More often and at a younger age if you are overweight or have a high risk for diabetes. What should I know about preventing infection? Hepatitis B If you have a higher risk for hepatitis B, you should be screened for this virus. Talk with your health care provider to find out if you are at risk for hepatitis B infection. Hepatitis C Testing is recommended for:  Everyone born from [birth year] through 1964.  Anyone with known risk factors  for hepatitis C. Sexually transmitted infections (STIs)  Get screened for STIs, including gonorrhea and chlamydia, if: ? You are sexually active and are younger than 62 years of age. ? You are older than 62 years of age and your health care provider tells you that you are at risk for this type of infection. ? Your sexual activity has changed since you were last screened, and you are at increased risk for chlamydia or gonorrhea. Ask your  health care provider if you are at risk.  Ask your health care provider about whether you are at high risk for HIV. Your health care provider may recommend a prescription medicine to help prevent HIV infection. If you choose to take medicine to prevent HIV, you should first get tested for HIV. You should then be tested every 3 months for as long as you are taking the medicine. Pregnancy  If you are about to stop having your period (premenopausal) and you may become pregnant, seek counseling before you get pregnant.  Take 400 to 800 micrograms (mcg) of folic acid every day if you become pregnant.  Ask for birth control (contraception) if you want to prevent pregnancy. Osteoporosis and menopause Osteoporosis is a disease in which the bones lose minerals and strength with aging. This can result in bone fractures. If you are 62 years old or older, or if you are at risk for osteoporosis and fractures, ask your health care provider if you should:  Be screened for bone loss.  Take a calcium or vitamin D supplement to lower your risk of fractures.  Be given hormone replacement therapy (HRT) to treat symptoms of menopause. Follow these instructions at home: Lifestyle  Do not use any products that contain nicotine or tobacco, such as cigarettes, e-cigarettes, and chewing tobacco. If you need help quitting, ask your health care provider.  Do not use street drugs.  Do not share needles.  Ask your health care provider for help if you need support or information about quitting drugs. Alcohol use  Do not drink alcohol if: ? Your health care provider tells you not to drink. ? You are pregnant, may be pregnant, or are planning to become pregnant.  If you drink alcohol: ? Limit how much you use to 0-1 drink a day. ? Limit intake if you are breastfeeding.  Be aware of how much alcohol is in your drink. In the [place] one drink equals one 12 oz bottle of beer (355 mL), one 5 oz glass of wine (148  mL), or one 1 oz glass of hard liquor (44 mL). General instructions  Schedule regular health, dental, and eye exams.  Stay current with your vaccines.  Tell your health care provider if: ? You often feel depressed. ? You have ever been abused or do not feel safe at home. Summary  Adopting a healthy lifestyle and getting preventive care are important in promoting health and wellness.  Follow your health care provider's instructions about healthy diet, exercising, and getting tested or screened for diseases.  Follow your health care provider's instructions on monitoring your cholesterol and blood pressure. This information is not intended to replace advice given to you by your health care provider. Make sure you discuss any questions you have with your health care provider. Document Released: 07/23/2010 Document Revised: 12/31/2017 Document Reviewed: 12/31/2017 Elsevier Patient Education  2020 [employer]  Pneumococcal Conjugate Vaccine (PCV13): What You Need to Know 1. Why get vaccinated? Pneumococcal conjugate vaccine (PCV13) can prevent pneumococcal disease. Pneumococcal  disease refers to any illness caused by pneumococcal bacteria. These bacteria can cause many types of illnesses, including pneumonia, which is an infection of the lungs. Pneumococcal bacteria are one of the most common causes of pneumonia. Besides pneumonia, pneumococcal bacteria can also cause:  Ear infections  Sinus infections  Meningitis (infection of the tissue covering the brain and spinal cord)  Bacteremia (bloodstream infection) Anyone can get pneumococcal disease, but children under 31 years of age, people with certain medical conditions, adults 65 years or older, and cigarette smokers are at the highest risk. Most pneumococcal infections are mild. However, some can result in long-term problems, such as brain damage or hearing loss. Meningitis, bacteremia, and pneumonia caused by pneumococcal disease  can be fatal. 2. PCV13 PCV13 protects against 13 types of bacteria that cause pneumococcal disease. Infants and young children usually need 4 doses of pneumococcal conjugate vaccine, at 2, 4, 6, and 12-57 months of age. In some cases, a child might need fewer than 4 doses to complete PCV13 vaccination. A dose of PCV23 vaccine is also recommended for anyone 2 years or older with certain medical conditions if they did not already receive PCV13. This vaccine may be given to adults 65 years or older based on discussions between the patient and health care provider. 3. Talk with your health care provider Tell your vaccine provider if the person getting the vaccine:  Has had an allergic reaction after a previous dose of PCV13, to an earlier pneumococcal conjugate vaccine known as PCV7, or to any vaccine containing diphtheria toxoid (for example, DTaP), or has any severe, life-threatening allergies.  In some cases, your health care provider may decide to postpone PCV13 vaccination to a future visit. People with minor illnesses, such as a cold, may be vaccinated. People who are moderately or severely ill should usually wait until they recover before getting PCV13. Your health care provider can give you more information. 4. Risks of a vaccine reaction  Redness, swelling, pain, or tenderness where the shot is given, and fever, loss of appetite, fussiness (irritability), feeling tired, headache, and chills can happen after PCV13. Young children may be at increased risk for seizures caused by fever after PCV13 if it is administered at the same time as inactivated influenza vaccine. Ask your health care provider for more information. People sometimes faint after medical procedures, including vaccination. Tell your provider if you feel dizzy or have vision changes or ringing in the ears. As with any medicine, there is a very remote chance of a vaccine causing a severe allergic reaction, other serious injury, or  death. 5. What if there is a serious problem? An allergic reaction could occur after the vaccinated person leaves the clinic. If you see signs of a severe allergic reaction (hives, swelling of the face and throat, difficulty breathing, a fast heartbeat, dizziness, or weakness), call 9-1-1 and get the person to the nearest hospital. For other signs that concern you, call your health care provider. Adverse reactions should be reported to the Vaccine Adverse Event Reporting System (VAERS). Your health care provider will usually file this report, or you can do it yourself. Visit the VAERS website at www.vaers.LAgents.no or call 513-305-1207. VAERS is only for reporting reactions, and VAERS staff do not give medical advice. 6. The National Vaccine Injury Compensation Program The [employer] Vaccine Injury Compensation Program (VICP) is a federal program that was created to compensate people who may have been injured by certain vaccines. Visit the VICP website at SpiritualWord.at or  call 615-745-7933 to learn about the program and about filing a claim. There is a time limit to file a claim for compensation. 7. How can I learn more?  Ask your health care provider.  Call your local or state health department.  Contact the Centers for Disease Control and Prevention (CDC): ? Call (984)465-2388 (1-800-CDC-INFO) or ? Visit CDC's website at PicCapture.uy Vaccine Information Statement PCV13 Vaccine (11/19/2017) This information is not intended to replace advice given to you by your health care provider. Make sure you discuss any questions you have with your health care provider. Document Released: 11/04/2005 Document Revised: 04/28/2018 Document Reviewed: 08/19/2017 Elsevier Patient Education  2020 [employer].

## 2018-12-29 LAB — HIV ANTIBODY (ROUTINE TESTING W REFLEX): HIV Screen 4th Generation wRfx: NONREACTIVE

## 2018-12-29 LAB — MAGNESIUM: Magnesium: 1.7 mg/dL (ref 1.6–2.3)

## 2018-12-31 ENCOUNTER — Encounter: Payer: BC Managed Care – PPO | Admitting: Nurse Practitioner

## 2019-03-26 DIAGNOSIS — N819 Female genital prolapse, unspecified: Secondary | ICD-10-CM | POA: Insufficient documentation

## 2019-03-29 ENCOUNTER — Encounter: Payer: Self-pay | Admitting: Nurse Practitioner

## 2019-03-29 ENCOUNTER — Ambulatory Visit: Payer: BC Managed Care – PPO | Admitting: Nurse Practitioner

## 2019-03-29 ENCOUNTER — Other Ambulatory Visit: Payer: Self-pay

## 2019-03-29 VITALS — BP 120/78 | HR 84 | Temp 97.8°F | Ht 63.4 in | Wt 210.8 lb

## 2019-03-29 DIAGNOSIS — E1140 Type 2 diabetes mellitus with diabetic neuropathy, unspecified: Secondary | ICD-10-CM | POA: Diagnosis not present

## 2019-03-29 DIAGNOSIS — Z794 Long term (current) use of insulin: Secondary | ICD-10-CM | POA: Diagnosis not present

## 2019-03-29 DIAGNOSIS — Z566 Other physical and mental strain related to work: Secondary | ICD-10-CM

## 2019-03-29 DIAGNOSIS — E782 Mixed hyperlipidemia: Secondary | ICD-10-CM | POA: Diagnosis not present

## 2019-03-29 NOTE — Progress Notes (Signed)
This visit occurred during the SARS-CoV-2 public health emergency.  Safety protocols were in place, including screening questions prior to the visit, additional usage of staff PPE, and extensive cleaning of exam room while observing appropriate contact time as indicated for disinfecting solutions.  Subjective:     Patient ID: Brittany Klein , female    DOB: 02/19/1955 , 63 y.o.   MRN: [id number]   Chief Complaint  Patient presents with  . Diabetes    HPI  Diabetes She presents for her follow-up diabetic visit. She has type 2 diabetes mellitus. There are no hypoglycemic associated symptoms. There are no diabetic associated symptoms. Pertinent negatives for diabetes include no chest pain. There are no hypoglycemic complications. Symptoms are stable. There are no diabetic complications. She is following a generally unhealthy diet. When asked about meal planning, she reported none. She has not had a previous visit with a dietitian. She rarely participates in exercise. (Blood sugars are averaging 200's. ) An ACE inhibitor/angiotensin II receptor blocker is being taken.     Past Medical History:  Diagnosis Date  . Anemia   . Arthritis    "knees, hands" (08/04/2012)  . Chronic lower back pain    "all the time" (08/04/2012)  . Dysrhythmia    Tachycardia, comes and goes. Increases with activity  . History of blood transfusion 2004   "related to knee replacement" (08/04/2012)  . Hyperlipemia   . Type II diabetes mellitus (HCC)      Family History  Problem Relation Age of Onset  . Diabetes Mother   . Cancer Mother   . Diabetes Father   . Cancer Sister   . Heart disease Sister   . Diabetes Sister      Current Outpatient Medications:  .  aspirin EC 81 MG tablet, Take 81 mg by mouth daily., Disp: , Rfl:  .  glimepiride (AMARYL) 4 MG tablet, Take 1 tab in am and 1/2 tab in evening, Disp: 135 tablet, Rfl: 1 .  metFORMIN (GLUMETZA) 1000 MG (MOD) 24 hr tablet, Take 1 tablet (1,000 mg total)  by mouth 2 (two) times daily with a meal., Disp: 180 tablet, Rfl: 1 .  simvastatin (ZOCOR) 40 MG tablet, Take 1 tablet (40 mg total) by mouth daily., Disp: 90 tablet, Rfl: 1   Allergies  Allergen Reactions  . Morphine And Related Itching     Review of Systems  Constitutional: Negative.   Respiratory: Negative.   Cardiovascular: Negative.  Negative for chest pain, palpitations and leg swelling.  Psychiatric/Behavioral: Negative.      Today's Vitals   03/29/19 1048  BP: 120/78  Pulse: 84  Temp: 97.8 F (36.6 C)  Weight: 210 lb 12.8 oz (95.6 kg)  Height: 5' 3.4" (1.61 m)  PainSc: 0-No pain   Body mass index is 36.87 kg/m.   Objective:  Physical Exam Constitutional:      General: She is not in acute distress.    Appearance: Normal appearance.  Cardiovascular:     Rate and Rhythm: Normal rate and regular rhythm.     Pulses: Normal pulses.     Heart sounds: Normal heart sounds. No murmur.  Pulmonary:     Effort: Pulmonary effort is normal. No respiratory distress.     Breath sounds: Normal breath sounds.  Skin:    General: Skin is warm and dry.     Capillary Refill: Capillary refill takes less than 2 seconds.  Neurological:     General: No focal deficit present.       This visit occurred during the SARS-CoV-2 public health emergency.  Safety protocols were in place, including screening questions prior to the visit, additional usage of staff PPE, and extensive cleaning of exam room while observing appropriate contact time as indicated for disinfecting solutions.  Subjective:     Patient ID: Brittany Klein , female    DOB: 1955/08/04 , 64 y.o.   MRN: [id number]   Chief Complaint  Patient presents with  . Diabetes    HPI  Diabetes She presents for her follow-up diabetic visit. She has type 2 diabetes mellitus. There are no hypoglycemic associated symptoms. There are no diabetic associated symptoms. Pertinent negatives for diabetes include no chest pain. There are no hypoglycemic complications. Symptoms are stable. There are no diabetic complications. She is following a generally unhealthy diet. When asked about meal planning, she reported none. She has not had a previous visit with a dietitian. She rarely participates in exercise. (Blood sugars are averaging 200's. ) An ACE inhibitor/angiotensin II receptor blocker is being taken.     Past Medical History:  Diagnosis Date  . Anemia   . Arthritis    "knees, hands" (08/04/2012)  . Chronic lower back pain    "all the time" (08/04/2012)  . Dysrhythmia    Tachycardia, comes and goes. Increases with activity  . History of blood transfusion 2004   "related to knee replacement" (08/04/2012)  . Hyperlipemia   . Type II diabetes mellitus (HCC)      Family History  Problem Relation Age of Onset  . Diabetes Mother   . Cancer Mother   . Diabetes Father   . Cancer Sister   . Heart disease Sister   . Diabetes Sister      Current Outpatient Medications:  .  aspirin EC 81 MG tablet, Take 81 mg by mouth daily., Disp: , Rfl:  .  glimepiride (AMARYL) 4 MG tablet, Take 1 tab in am and 1/2 tab in evening, Disp: 135 tablet, Rfl: 1 .  metFORMIN (GLUMETZA) 1000 MG (MOD) 24 hr tablet, Take 1 tablet (1,000 mg total)  by mouth 2 (two) times daily with a meal., Disp: 180 tablet, Rfl: 1 .  simvastatin (ZOCOR) 40 MG tablet, Take 1 tablet (40 mg total) by mouth daily., Disp: 90 tablet, Rfl: 1   Allergies  Allergen Reactions  . Morphine And Related Itching     Review of Systems  Constitutional: Negative.   Respiratory: Negative.   Cardiovascular: Negative.  Negative for chest pain, palpitations and leg swelling.  Psychiatric/Behavioral: Negative.      Today's Vitals   03/29/19 1048  BP: 120/78  Pulse: 84  Temp: 97.8 F (36.6 C)  Weight: 210 lb 12.8 oz (95.6 kg)  Height: 5' 3.4" (1.61 m)  PainSc: 0-No pain   Body mass index is 36.87 kg/m.   Objective:  Physical Exam Constitutional:      General: She is not in acute distress.    Appearance: Normal appearance.  Cardiovascular:     Rate and Rhythm: Normal rate and regular rhythm.     Pulses: Normal pulses.     Heart sounds: Normal heart sounds. No murmur.  Pulmonary:     Effort: Pulmonary effort is normal. No respiratory distress.     Breath sounds: Normal breath sounds.  Skin:    General: Skin is warm and dry.     Capillary Refill: Capillary refill takes less than 2 seconds.  Neurological:     General: No focal deficit present.

## 2019-03-30 ENCOUNTER — Other Ambulatory Visit: Payer: Self-pay | Admitting: Nurse Practitioner

## 2019-03-30 DIAGNOSIS — E1140 Type 2 diabetes mellitus with diabetic neuropathy, unspecified: Secondary | ICD-10-CM

## 2019-03-30 DIAGNOSIS — Z794 Long term (current) use of insulin: Secondary | ICD-10-CM

## 2019-03-30 LAB — CMP14+EGFR
ALT: 13 IU/L (ref 0–32)
AST: 16 IU/L (ref 0–40)
Albumin/Globulin Ratio: 1.3 (ref 1.2–2.2)
Albumin: 4.1 g/dL (ref 3.8–4.8)
Alkaline Phosphatase: 95 IU/L (ref 39–117)
BUN/Creatinine Ratio: 26 (ref 12–28)
BUN: 22 mg/dL (ref 8–27)
Bilirubin Total: 0.3 mg/dL (ref 0.0–1.2)
CO2: 25 mmol/L (ref 20–29)
Calcium: 10 mg/dL (ref 8.7–10.3)
Chloride: 103 mmol/L (ref 96–106)
Creatinine, Ser: 0.85 mg/dL (ref 0.57–1.00)
GFR calc Af Amer: 85 mL/min/{1.73_m2} (ref >59)
GFR calc non Af Amer: 74 mL/min/{1.73_m2} (ref >59)
Globulin, Total: 3.2 g/dL (ref 1.5–4.5)
Glucose: 128 mg/dL — ABNORMAL HIGH (ref 65–99)
Potassium: 5 mmol/L (ref 3.5–5.2)
Sodium: 140 mmol/L (ref 134–144)
Total Protein: 7.3 g/dL (ref 6.0–8.5)

## 2019-03-30 LAB — LIPID PANEL
Chol/HDL Ratio: 3.1 ratio (ref 0.0–4.4)
Cholesterol, Total: 155 mg/dL (ref 100–199)
HDL: 50 mg/dL (ref >39)
LDL Chol Calc (NIH): 90 mg/dL (ref 0–99)
Triglycerides: 78 mg/dL (ref 0–149)
VLDL Cholesterol Cal: 15 mg/dL (ref 5–40)

## 2019-03-30 LAB — HEMOGLOBIN A1C
Est. average glucose Bld gHb Est-mCnc: 206 mg/dL
Hgb A1c MFr Bld: 8.8 % — ABNORMAL HIGH (ref 4.8–5.6)

## 2019-03-30 MED ORDER — RYBELSUS 7 MG PO TABS: 1.0000 | ORAL_TABLET | Freq: Every day | ORAL | 2 refills | Status: DC

## 2019-03-31 ENCOUNTER — Telehealth: Payer: Self-pay

## 2019-03-31 NOTE — Telephone Encounter (Signed)
Left vm for pt to call back for lab results   

## 2019-03-31 NOTE — Telephone Encounter (Signed)
-----   Message from [place], [place] sent at 03/31/2019  9:43 AM EST -----  ----- Message ----- From: Arnette Felts, FNP Sent: 03/30/2019   5:49 PM EST To: Brittany Klein, CMA, #  Kidney and liver functions are normal.  Your HgbA1c is up to 8.8 from 6.6 this is up too high, we need to add another medication to your regimen, I would like to add Rybelsus daily you will take 30 minutes before meals, you can come and pick up a sample to get you started.  Cholesterol levels are normal.

## 2019-04-13 ENCOUNTER — Encounter: Payer: Self-pay | Admitting: Nurse Practitioner

## 2019-04-13 LAB — HM DIABETES EYE EXAM

## 2019-05-05 ENCOUNTER — Other Ambulatory Visit: Payer: Self-pay | Admitting: Nurse Practitioner

## 2019-05-05 DIAGNOSIS — E119 Type 2 diabetes mellitus without complications: Secondary | ICD-10-CM

## 2019-05-25 ENCOUNTER — Other Ambulatory Visit: Payer: Self-pay | Admitting: Nurse Practitioner

## 2019-05-25 DIAGNOSIS — E782 Mixed hyperlipidemia: Secondary | ICD-10-CM

## 2019-05-25 DIAGNOSIS — E119 Type 2 diabetes mellitus without complications: Secondary | ICD-10-CM

## 2019-06-10 ENCOUNTER — Ambulatory Visit: Payer: Self-pay

## 2019-06-10 ENCOUNTER — Ambulatory Visit (INDEPENDENT_AMBULATORY_CARE_PROVIDER_SITE_OTHER): Payer: 59

## 2019-06-10 ENCOUNTER — Other Ambulatory Visit: Payer: Self-pay

## 2019-06-10 ENCOUNTER — Ambulatory Visit: Payer: 59 | Admitting: Orthopedic Surgery

## 2019-06-10 VITALS — Ht 65.0 in | Wt 207.0 lb

## 2019-06-10 DIAGNOSIS — M25561 Pain in right knee: Secondary | ICD-10-CM

## 2019-06-10 DIAGNOSIS — G8929 Other chronic pain: Secondary | ICD-10-CM

## 2019-06-10 DIAGNOSIS — M1711 Unilateral primary osteoarthritis, right knee: Secondary | ICD-10-CM

## 2019-06-10 DIAGNOSIS — M25562 Pain in left knee: Secondary | ICD-10-CM

## 2019-06-10 DIAGNOSIS — M25552 Pain in left hip: Secondary | ICD-10-CM

## 2019-06-11 ENCOUNTER — Encounter: Payer: Self-pay | Admitting: Orthopedic Surgery

## 2019-06-11 DIAGNOSIS — M1711 Unilateral primary osteoarthritis, right knee: Secondary | ICD-10-CM | POA: Diagnosis not present

## 2019-06-11 MED ORDER — BUPIVACAINE HCL 0.25 % IJ SOLN
4.0000 mL | INTRAMUSCULAR | Status: AC | PRN
  Administered 2019-06-11: 4 mL via INTRA_ARTICULAR

## 2019-06-11 MED ORDER — METHYLPREDNISOLONE ACETATE 40 MG/ML IJ SUSP
40.0000 mg | INTRAMUSCULAR | Status: AC | PRN
  Administered 2019-06-11: 40 mg via INTRA_ARTICULAR

## 2019-06-11 MED ORDER — LIDOCAINE HCL 1 % IJ SOLN
5.0000 mL | INTRAMUSCULAR | Status: AC | PRN
  Administered 2019-06-11: 5 mL

## 2019-06-11 NOTE — Progress Notes (Signed)
Office Visit Note   [name]           Date of Birth: 17-Dec-1955           MRN: [id number] Visit Date: 06/10/2019 Requested by: Arnette Felts, FNP [address] [place],  [place] [address] PCP: Arnette Felts, FNP  Subjective: Chief Complaint  Patient presents with  . Right Knee - Pain  . Left Knee - Pain    HPI: Brittany Klein is a 64 year old patient with bilateral knee pain right equal to left.  She has had left total knee revision and has not had any type of right knee replacement.  Describes diffuse right knee pain with stiffness and pain that wakes her from sleep at night.  She also reports some left groin pain when she stands.  She is actually little bit more concerned about her hip and groin then she has about her knees.  Had left total knee replacement in 2005 with revision about 5 years ago.  Denies any fevers or chills.  Denies much in the way of mechanical symptoms in either knee.  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  She is considering filing for disability.              ROS: All systems reviewed are negative as they relate to the chief complaint within the history of present illness.  Patient denies  fevers or chills.   Assessment & Plan: Visit Diagnoses:  1. Chronic pain of both knees   2. Pain in left hip     Plan: Impression is bilateral knee pain with end-stage right knee arthritis particularly in the patellofemoral compartment and mild left knee pain in a revised total knee replacement.  Radiographically that prosthesis appears intact and there is no effusion.  The right native knee does have some arthritis.  We will inject that knee today.  Follow-up as needed.  No evidence of any arthritis in the left hip joint.  Follow-Up Instructions: No follow-ups on file.   Orders:  Orders Placed This Encounter  Procedures  . XR KNEE 3 VIEW RIGHT  . XR KNEE 3 VIEW LEFT  . XR HIP UNILAT W OR W/O PELVIS 2-3 VIEWS LEFT   No orders  of the defined types were placed in this encounter.     Procedures: Large Joint Inj: R knee on 06/11/2019 9:14 PM Indications: diagnostic evaluation, joint swelling and pain Details: 18 G 1.5 in needle, superolateral approach  Arthrogram: No  Medications: 5 mL lidocaine 1 %; 40 mg methylPREDNISolone acetate 40 MG/ML; 4 mL bupivacaine 0.25 % Outcome: tolerated well, no immediate complications Procedure, treatment alternatives, risks and benefits explained, specific risks discussed. Consent was given by the patient. Immediately prior to procedure a time out was called to verify the correct patient, procedure, equipment, support staff and site/side marked as required. Patient was prepped and draped in the usual sterile fashion.       Clinical Data: No additional findings.  Objective: Vital Signs: Ht 5\' 5"  (1.651 m)   Wt 207 lb (93.9 kg)   BMI 34.45 kg/m   Physical Exam:   Constitutional: Patient appears well-developed HEENT:  Head: Normocephalic Eyes:EOM are normal Neck: Normal range of motion Cardiovascular: Normal rate Pulmonary/chest: Effort normal Neurologic: Patient is alert Skin: Skin is warm Psychiatric: Patient has normal mood and affect    Ortho Exam: Ortho exam demonstrates normal gait alignment.  No effusion in either knee.  Patient has  full extension bilaterally with flexion easily past 90 degrees in both knees.  Collaterals are stable to varus valgus stress at 0 and 30 degrees in both knees.  No warmth to either knee.  Extensor mechanism intact bilaterally.  Specialty Comments:  No specialty comments available.  Imaging: No results found.   PMFS History: Patient Active Problem List   Diagnosis Date Noted  . Mixed hyperlipidemia 06/03/2018  . Type 2 diabetes mellitus with diabetic neuropathy, with long-term current use of insulin (Festus) 03/04/2018  . Prolapse of female bladder, acquired 03/04/2018   Past Medical History:  Diagnosis Date  . Anemia    . Arthritis    "knees, hands" (08/04/2012)  . Chronic lower back pain    "all the time" (08/04/2012)  . Dysrhythmia    Tachycardia, comes and goes. Increases with activity  . History of blood transfusion 2004   "related to knee replacement" (08/04/2012)  . Hyperlipemia   . Type II diabetes mellitus (HCC)     Family History  Problem Relation Age of Onset  . Diabetes Mother   . Cancer Mother   . Diabetes Father   . Cancer Sister   . Heart disease Sister   . Diabetes Sister     Past Surgical History:  Procedure Laterality Date  . CARPAL TUNNEL RELEASE Left 1990's  . HERNIA REPAIR  8676   umbilical  . JOINT REPLACEMENT    . TOTAL KNEE ARTHROPLASTY Left 10/2002  . TOTAL KNEE REVISION Left 08/04/2012  . TOTAL KNEE REVISION Left 08/04/2012   Procedure: LEFT TOTAL KNEE REVISION;  Surgeon: Meredith Pel, MD;  Location: [facility];  Service: Orthopedics;  Laterality: Left;  . TUBAL LIGATION  1980   Social History   Occupational History  . Not on file  Tobacco Use  . Smoking status: Former Smoker    Packs/day: 1.00    Years: 23.00    Pack years: 23.00    Types: Cigarettes    Quit date: 12/01/1998    Years since quitting: 20.5  . Smokeless tobacco: Never Used  Substance and Sexual Activity  . Alcohol use: Yes    Comment: 08/05/2012 "don't drink anymore; stopped ~ 11/1998; never had problem w/it"  . Drug use: No  . Sexual activity: Never

## 2019-06-14 ENCOUNTER — Telehealth: Payer: Self-pay | Admitting: Orthopedic Surgery

## 2019-06-14 NOTE — Telephone Encounter (Signed)
Patient has an appointment for her disability and wanted to know if Dr. August Saucer would write a summary of her condition.  CB#[phone].  Thank you.  Her appointment is Friday and would need it before then.

## 2019-06-14 NOTE — Telephone Encounter (Signed)
Please advise 

## 2019-06-16 NOTE — Telephone Encounter (Signed)
That typically not what is done.  Typically they just get all the records and they look at it themselves.  Never done a summary for disability before.

## 2019-06-16 NOTE — Telephone Encounter (Signed)
She states she knows you aren't putting her on disability she just needs documentation of her issues on a dictation from you for her claim, she will get other office notes aswell from [facility]

## 2019-06-28 ENCOUNTER — Ambulatory Visit: Payer: BC Managed Care – PPO | Admitting: Nurse Practitioner

## 2019-06-28 NOTE — Telephone Encounter (Signed)
Problem list #1 type 2 diabetes with neuropathy #2 bladder prolapse #3 mixed hyperlipidemia #4 right knee arthritis #5 left knee pain status post knee replacement and revision approximately 10 years ago.

## 2019-10-26 ENCOUNTER — Other Ambulatory Visit: Payer: Self-pay | Admitting: Nurse Practitioner

## 2019-10-26 DIAGNOSIS — E119 Type 2 diabetes mellitus without complications: Secondary | ICD-10-CM

## 2019-11-20 ENCOUNTER — Ambulatory Visit: Payer: 59 | Attending: Internal Medicine

## 2019-11-20 ENCOUNTER — Other Ambulatory Visit: Payer: Self-pay

## 2019-11-20 DIAGNOSIS — Z23 Encounter for immunization: Secondary | ICD-10-CM

## 2019-11-20 NOTE — Progress Notes (Signed)
   Covid-19 Vaccination Clinic  Name:  MALLIE GIAMBRA    MRN: [id number] DOB: Apr 05, 1955  11/20/2019  Ms. Quarry was observed post Covid-19 immunization for 15 minutes without incident. She was provided with Vaccine Information Sheet and instruction to access the V-Safe system.   Ms. Choi was instructed to call 911 with any severe reactions post vaccine: Marland Kitchen Difficulty breathing  . Swelling of face and throat  . A fast heartbeat  . A bad rash all over body  . Dizziness and weakness

## 2020-01-03 ENCOUNTER — Encounter: Payer: BC Managed Care – PPO | Admitting: Nurse Practitioner

## 2020-03-28 ENCOUNTER — Other Ambulatory Visit: Payer: Self-pay | Admitting: Nurse Practitioner

## 2020-03-28 DIAGNOSIS — E119 Type 2 diabetes mellitus without complications: Secondary | ICD-10-CM

## 2020-04-25 ENCOUNTER — Telehealth: Payer: Self-pay

## 2020-04-25 ENCOUNTER — Other Ambulatory Visit: Payer: Self-pay | Admitting: Nurse Practitioner

## 2020-04-25 DIAGNOSIS — R0600 Dyspnea, unspecified: Secondary | ICD-10-CM | POA: Insufficient documentation

## 2020-04-25 DIAGNOSIS — R002 Palpitations: Secondary | ICD-10-CM | POA: Insufficient documentation

## 2020-04-25 DIAGNOSIS — E119 Type 2 diabetes mellitus without complications: Secondary | ICD-10-CM

## 2020-04-25 NOTE — Telephone Encounter (Signed)
lvm for patient to return call to schedule appt °

## 2020-06-23 ENCOUNTER — Ambulatory Visit: Payer: Self-pay

## 2020-06-23 ENCOUNTER — Encounter: Payer: Self-pay | Admitting: Surgical

## 2020-06-23 ENCOUNTER — Ambulatory Visit (INDEPENDENT_AMBULATORY_CARE_PROVIDER_SITE_OTHER): Payer: 59 | Admitting: Surgical

## 2020-06-23 DIAGNOSIS — M542 Cervicalgia: Secondary | ICD-10-CM | POA: Diagnosis not present

## 2020-06-23 DIAGNOSIS — G8929 Other chronic pain: Secondary | ICD-10-CM

## 2020-06-23 DIAGNOSIS — M25511 Pain in right shoulder: Secondary | ICD-10-CM

## 2020-06-27 ENCOUNTER — Ambulatory Visit (INDEPENDENT_AMBULATORY_CARE_PROVIDER_SITE_OTHER): Payer: 59 | Admitting: Psychology

## 2020-07-01 ENCOUNTER — Encounter: Payer: Self-pay | Admitting: Surgical

## 2020-07-01 DIAGNOSIS — M25511 Pain in right shoulder: Secondary | ICD-10-CM

## 2020-07-01 MED ORDER — BUPIVACAINE HCL 0.25 % IJ SOLN
0.6600 mL | INTRAMUSCULAR | Status: AC | PRN
  Administered 2020-07-01: .66 mL via INTRA_ARTICULAR

## 2020-07-01 MED ORDER — LIDOCAINE HCL 1 % IJ SOLN
3.0000 mL | INTRAMUSCULAR | Status: AC | PRN
Start: 2020-07-01 — End: 2020-07-01
  Administered 2020-07-01: 3 mL

## 2020-07-01 MED ORDER — METHYLPREDNISOLONE ACETATE 40 MG/ML IJ SUSP
13.3300 mg | INTRAMUSCULAR | Status: AC | PRN
  Administered 2020-07-01: 13.33 mg via INTRA_ARTICULAR

## 2020-07-01 NOTE — Progress Notes (Signed)
Office Visit Note   [name]           Date of Birth: 05-10-1955           MRN: [id number] Visit Date: 06/23/2020 Requested by: No referring provider defined for this encounter. PCP: No primary care provider on file.  Subjective: Chief Complaint  Patient presents with   Right Shoulder - Pain   Neck - Pain    HPI: Brittany Klein is a 65 y.o. female who presents to the office complaining of neck and right shoulder pain.  Patient complains of pain since she picked up her daughter's purse about a year ago.  Localizes pain to the superior aspect of the right shoulder with radiation to the neck and elbow on occasion.  She cannot sleep on her right side.  Her right arm feels heavy.  She wakes with pain if she tries to sleep on her right side.  No history of right shoulder surgery.  She worked in [employer] at [employer] but is currently working on getting disability.  She has pain with lifting her arm.  Has not dropping any objects.  She does have history of diabetes and reports last A1c was "6 something".                ROS: All systems reviewed are negative as they relate to the chief complaint within the history of present illness.  Patient denies fevers or chills.  Assessment & Plan: Visit Diagnoses:  1. Neck pain   2. Chronic right shoulder pain     Plan: Patient is a 65 year old female who presents complaining of right shoulder pain.  Most of her pain is localized to the [facility] joint.  She has increased pain with palpation to the [facility] joint that is asymmetric to the other side as well as pain with passive abduction and adduction across the chest that she localizes to the top of the shoulder.  Impression is AC joint arthralgia.  Radiographs were reviewed with her today and do show arthritis of the [facility] joint with no significant degenerative changes of the glenohumeral joint.  Discussed options available to patient.  She would like to proceed with right shoulder AC joint injection today.  AC  joint was injected and after initial injection of local anesthetic, patient reported significant improvement in her pain when her shoulder was passively AB ducted, cross arm adduction, palpated at the [facility] joint.  She tolerated the injection well and plan for her to follow-up with the office in 6 weeks for clinical recheck with Dr. August Saucer.  If she is feeling on the present at the time she may call and cancel the appointment.  Follow-Up Instructions: No follow-ups on file.   Orders:  Orders Placed This Encounter  Procedures   XR Shoulder Right   XR Cervical Spine 2 or 3 views   No orders of the defined types were placed in this encounter.     Procedures: Medium Joint Inj: R acromioclavicular on 07/01/2020 3:44 PM Indications: pain and diagnostic evaluation Details: 27 G 1.5 in needle, ultrasound-guided superior approach Medications: 13.33 mg methylPREDNISolone acetate 40 MG/ML; 0.66 mL bupivacaine 0.25 %; 3 mL lidocaine 1 % Outcome: tolerated well, no immediate complications Procedure, treatment alternatives, risks and benefits explained, specific risks discussed. Consent was given by the patient. Immediately prior to procedure a time out was called to verify the correct patient, procedure, equipment, support staff and site/side marked as required. Patient was prepped and  draped in the usual sterile fashion.      Clinical Data: No additional findings.  Objective: Vital Signs: There were no vitals taken for this visit.  Physical Exam:  Constitutional: Patient appears well-developed HEENT:  Head: Normocephalic Eyes:EOM are normal Neck: Normal range of motion Cardiovascular: Normal rate Pulmonary/chest: Effort normal Neurologic: Patient is alert Skin: Skin is warm Psychiatric: Patient has normal mood and affect  Ortho Exam: Ortho exam demonstrates right shoulder with 50 degrees external rotation, 100 degrees abduction, 130 degrees forward flexion.  She has active range of motion is  equivalent to passive range of motion.  No rotator cuff weakness on exam.  5/5 motor strength of bilateral grip strength, finger abduction, pronation/supination, bicep, tricep, deltoid.  Tenderness over the bicipital groove mildly.  Moderate to severe tenderness over the [facility] joint on the right-hand side there is asymmetric compared to left hand side.  No tenderness through the axial cervical spine.  No pain with cervical spine range of motion.  Negative Spurling sign.  Specialty Comments:  No specialty comments available.  Imaging: No results found.   PMFS History: Patient Active Problem List   Diagnosis Date Noted   Mixed hyperlipidemia 06/03/2018   Type 2 diabetes mellitus with diabetic neuropathy, with long-term current use of insulin (HCC) 03/04/2018   Prolapse of female bladder, acquired 03/04/2018   Past Medical History:  Diagnosis Date   Anemia    Arthritis    "knees, hands" (08/04/2012)   Chronic lower back pain    "all the time" (08/04/2012)   Dysrhythmia    Tachycardia, comes and goes. Increases with activity   History of blood transfusion 2004   "related to knee replacement" (08/04/2012)   Hyperlipemia    Type II diabetes mellitus (HCC)     Family History  Problem Relation Age of Onset   Diabetes Mother    Cancer Mother    Diabetes Father    Cancer Sister    Heart disease Sister    Diabetes Sister     Past Surgical History:  Procedure Laterality Date   CARPAL TUNNEL RELEASE Left 1990's   HERNIA REPAIR  2000   umbilical   JOINT REPLACEMENT     TOTAL KNEE ARTHROPLASTY Left 10/2002   TOTAL KNEE REVISION Left 08/04/2012   TOTAL KNEE REVISION Left 08/04/2012   Procedure: LEFT TOTAL KNEE REVISION;  Surgeon: Cammy Copa, MD;  Location: [facility];  Service: Orthopedics;  Laterality: Left;   TUBAL LIGATION  1980   Social History   Occupational History   Not on file  Tobacco Use   Smoking status: Former    Packs/day: 1.00    Years: 23.00    Pack years: 23.00     Types: Cigarettes    Quit date: 12/01/1998    Years since quitting: 21.5   Smokeless tobacco: Never  Substance and Sexual Activity   Alcohol use: Yes    Comment: 08/05/2012 "don't drink anymore; stopped ~ 11/1998; never had problem w/it"   Drug use: No   Sexual activity: Never

## 2020-07-11 ENCOUNTER — Ambulatory Visit: Payer: 59 | Admitting: Psychology

## 2020-07-28 ENCOUNTER — Other Ambulatory Visit: Payer: Self-pay

## 2020-07-28 ENCOUNTER — Ambulatory Visit (INDEPENDENT_AMBULATORY_CARE_PROVIDER_SITE_OTHER): Payer: 59 | Admitting: Surgical

## 2020-07-28 DIAGNOSIS — M542 Cervicalgia: Secondary | ICD-10-CM

## 2020-07-28 DIAGNOSIS — M25511 Pain in right shoulder: Secondary | ICD-10-CM | POA: Diagnosis not present

## 2020-07-28 DIAGNOSIS — G8929 Other chronic pain: Secondary | ICD-10-CM

## 2020-07-29 ENCOUNTER — Encounter: Payer: Self-pay | Admitting: Surgical

## 2020-07-29 NOTE — Progress Notes (Signed)
Office Visit Note   [name]           Date of Birth: [birth year]           MRN: [id number] Visit Date: 07/28/2020 Requested by: No referring provider defined for this encounter. PCP: No primary care provider on file.  Subjective: Chief Complaint  Patient presents with   Right Shoulder - Follow-up    HPI: Brittany Klein is a 65 y.o. female who presents to the office complaining of bilateral shoulder and neck pain.  Patient returns for about 6-week follow-up appointment following right shoulder acromioclavicular joint injection on 06/23/2020.  She reports that she had 20% improvement in her pain for several days but now her pain has recurred.  She localizes most of her pain to the superior lateral aspect of the right shoulder with radiation down to the elbow at times.  She has no numbness or tingling in the arm or radiation past the elbow.  She also reports increasing pain in the axial cervical spine and along the paraspinal musculature of the cervical spine both on the right and left.  She is also noticing increasing left shoulder pain in a similar distribution to the right shoulder but not as intense in regards to pain.  She has no history of neck or shoulder surgery.  She does endorse occasional shoulder blade pain around the medial scapula but this is intermittent and not constant.  Denies any weakness in her hands or gait abnormalities..                ROS: All systems reviewed are negative as they relate to the chief complaint within the history of present illness.  Patient denies fevers or chills.  Assessment & Plan: Visit Diagnoses:  1. Arthralgia of right acromioclavicular joint   2. Neck pain     Plan: Patient is a 65 year old female who presents complaining of continued right shoulder pain.  She had right shoulder AC joint injection about 5 weeks ago that provided some relief at the time of the administration of lidocaine injection and some improvement in her right  shoulder for several days after the injection.  However, this was only about 20% relief for her and now pain has recurred.  She endorses worsening of the right shoulder pain as well as progression of her neck pain and left shoulder pain.  She has some coarseness on examination of the right shoulder that is not present on the left as well as pain with passive range of motion of the shoulder and tenderness over the [facility] joint.  With continued pain that is not resolved with the injection as well as worsening neck pain, plan to order MRI arthrogram of the right shoulder for evaluation of any edema in the acromioclavicular joint versus rotator cuff pathology.  Also order MRI of the cervical spine for evaluation of cervical spine pathology that may contribute to radicular shoulder pain.  She does have radiographs from her last visit that demonstrated degenerative changes at multiple levels in the C-spine.  Follow-up after MRIs to review results.  Follow-Up Instructions: No follow-ups on file.   Orders:  No orders of the defined types were placed in this encounter.  No orders of the defined types were placed in this encounter.     Procedures: No procedures performed   Clinical Data: No additional findings.  Objective: Vital Signs: There were no vitals taken for this visit.  Physical Exam:  Constitutional: Patient appears well-developed  HEENT:  Head: Normocephalic Eyes:EOM are normal Neck: Normal range of motion Cardiovascular: Normal rate Pulmonary/chest: Effort normal Neurologic: Patient is alert Skin: Skin is warm Psychiatric: Patient has normal mood and affect  Ortho Exam: Ortho exam demonstrates left shoulder with 65 degrees external rotation, 90 degrees abduction, 120 degrees forward flexion.  Right shoulder with 65 degrees external rotation, 100 degrees abduction, 150 degrees forward flexion.  Equivalent internal rotation with both shoulders.  She has pain with passive range of motion  of both shoulders, more particularly with forward flexion of the right shoulder and with lowering of the right shoulder from full forward flexion.  Negative drop arm test.  Negative Hornblower sign.  Negative belly press test.  Excellent strength of supra, subscap of both shoulders though she does have increased pain with testing the supraspinatus on the right shoulder.  She has 5/5 motor strength of external rotation on the left shoulder but 5 -/5 external rotation strength on the right.  There is some coarseness noted with passive motion of the shoulder in the anterior lateral aspect of the right shoulder.  Tenderness throughout the axial cervical spine around the level of C5.  Increased pain with active range of motion of the cervical spine when looking up but no pain with looking down, to the right, to the left.  Negative Spurling sign.  Sensation intact through all dermatomes of the bilateral upper extremities.  5/5 motor strength of bilateral hip strength, finger abduction, pronation/supination, bicep, tricep, deltoid.  Negative Hoffmann sign bilaterally.  Specialty Comments:  No specialty comments available.  Imaging: No results found.   PMFS History: Patient Active Problem List   Diagnosis Date Noted   Mixed hyperlipidemia 06/03/2018   Type 2 diabetes mellitus with diabetic neuropathy, with long-term current use of insulin (HCC) 03/04/2018   Prolapse of female bladder, acquired 03/04/2018   Past Medical History:  Diagnosis Date   Anemia    Arthritis    "knees, hands" (08/04/2012)   Chronic lower back pain    "all the time" (08/04/2012)   Dysrhythmia    Tachycardia, comes and goes. Increases with activity   History of blood transfusion 2004   "related to knee replacement" (08/04/2012)   Hyperlipemia    Type II diabetes mellitus (HCC)     Family History  Problem Relation Age of Onset   Diabetes Mother    Cancer Mother    Diabetes Father    Cancer Sister    Heart disease Sister     Diabetes Sister     Past Surgical History:  Procedure Laterality Date   CARPAL TUNNEL RELEASE Left 1990's   HERNIA REPAIR  2000   umbilical   JOINT REPLACEMENT     TOTAL KNEE ARTHROPLASTY Left 10/2002   TOTAL KNEE REVISION Left 08/04/2012   TOTAL KNEE REVISION Left 08/04/2012   Procedure: LEFT TOTAL KNEE REVISION;  Surgeon: Cammy Copa, MD;  Location: [facility] OR;  Service: Orthopedics;  Laterality: Left;   TUBAL LIGATION  1980   Social History   Occupational History   Not on file  Tobacco Use   Smoking status: Former    Packs/day: 1.00    Years: 23.00    Pack years: 23.00    Types: Cigarettes    Quit date: 12/01/1998    Years since quitting: 21.6   Smokeless tobacco: Never  Substance and Sexual Activity   Alcohol use: Yes    Comment: 08/05/2012 "don't drink anymore; stopped ~ 11/1998; never had problem w/it"  Drug use: No   Sexual activity: Never

## 2020-07-31 ENCOUNTER — Ambulatory Visit: Payer: 59 | Admitting: Surgical

## 2020-08-01 ENCOUNTER — Ambulatory Visit: Payer: 59 | Admitting: Psychology

## 2020-08-15 ENCOUNTER — Other Ambulatory Visit: Payer: Self-pay

## 2020-08-15 ENCOUNTER — Ambulatory Visit (INDEPENDENT_AMBULATORY_CARE_PROVIDER_SITE_OTHER): Payer: 59 | Admitting: Psychology

## 2020-08-15 DIAGNOSIS — F3289 Other specified depressive episodes: Secondary | ICD-10-CM | POA: Diagnosis not present

## 2020-08-17 ENCOUNTER — Ambulatory Visit
Admission: RE | Admit: 2020-08-17 | Discharge: 2020-08-17 | Disposition: A | Discharge status: Home / Self Care | Payer: 59 | Source: Ambulatory Visit | Attending: Surgical | Admitting: Surgical

## 2020-08-17 ENCOUNTER — Other Ambulatory Visit: Payer: 59

## 2020-08-17 ENCOUNTER — Other Ambulatory Visit: Payer: Self-pay

## 2020-08-17 DIAGNOSIS — G8929 Other chronic pain: Secondary | ICD-10-CM

## 2020-08-17 DIAGNOSIS — M542 Cervicalgia: Secondary | ICD-10-CM

## 2020-08-17 DIAGNOSIS — M25511 Pain in right shoulder: Secondary | ICD-10-CM

## 2020-08-17 IMAGING — XA DG FLUORO GUIDE NDL PLC/BX
1 series · 1 of 1 positions shown · non-contrast
Comparison: none

CLINICAL DATA: Pain.  No previous surgery.

EXAM:
EXAM
RIGHT SHOULDER INJECTION UNDER FLUOROSCOPY FOR MRI
FLUOROSCOPY TIME:  12 seconds; 4.91 [FW] DAP
TECHNIQUE: The procedure, risks (including but not limited to bleeding,
infection, organ damage ), benefits, and alternatives were explained
to the patient. Questions regarding the procedure were encouraged
and answered. The patient understands and consents to the procedure.

[Series 1: ortho standard · 1 of 1 slices shown]
[im 1/1]
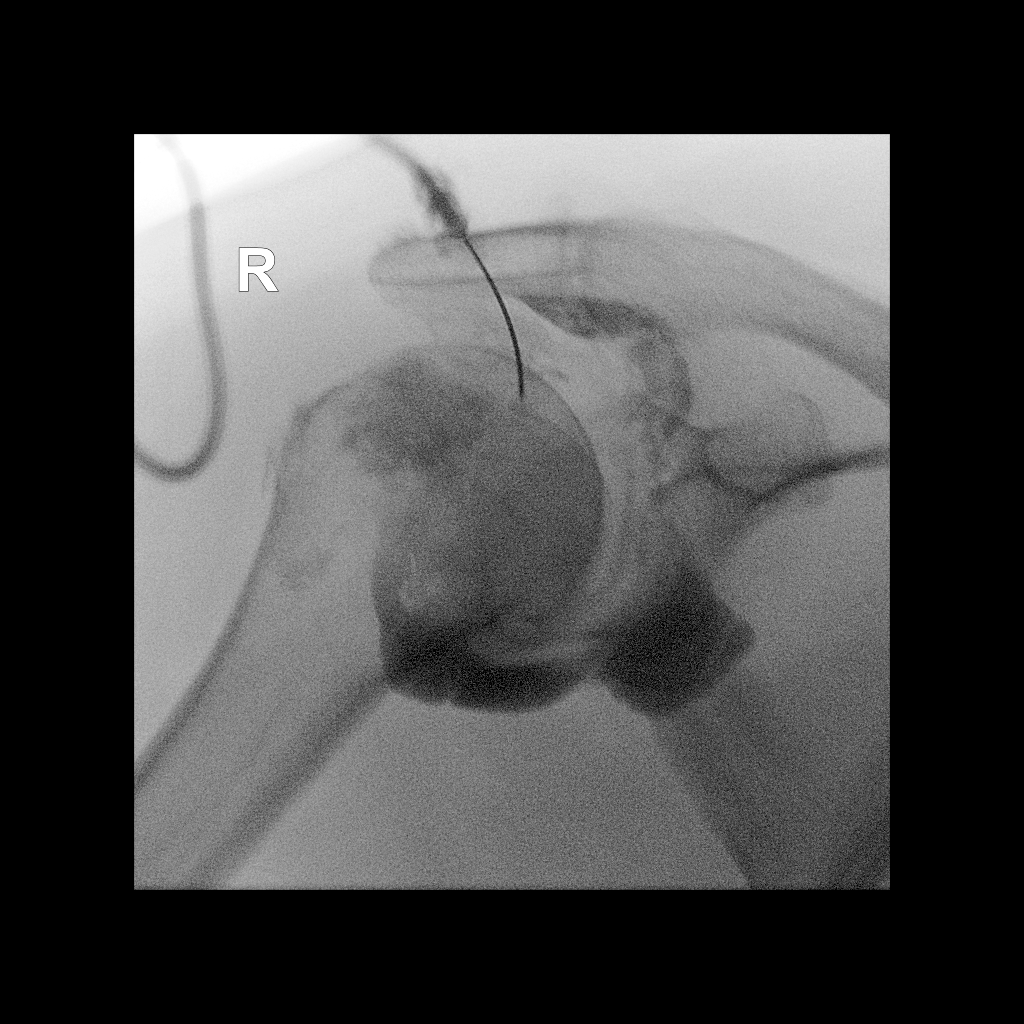

[1 of 1 positions shown; findings below may reference images not displayed]

An appropriate skin entry site was determined under fluoroscopy.
Skin site was marked, prepped with Betadine, and draped in usual
sterile fashion, and infiltrated locally with 1% lidocaine.

22-gauge spinal needle advanced to the superior medial margin of the
humeral head. 1 mL of lidocaine 1% injected easily. 15ml of a
mixture of 20 mL dilute iodinated contrast with 0.1ml Multihance
contrast was injected into the shoulder joint. Intraarticular flow
was confirmed on fluoroscopy. Patient transferred to MRI.

COMPLICATIONS:
COMPLICATIONS
none
IMPRESSION: 1. Technically successful right shoulder injection for MRI

## 2020-08-17 IMAGING — MR MR CERVICAL SPINE W/O CM
4 of 5 series · 30 of 48 positions shown · non-contrast
Comparison: None.

CLINICAL DATA: Cervical spine stenosis. Neck pain extending down
both arms for 6-12 months.

EXAM:
MRI CERVICAL SPINE WITHOUT CONTRAST
TECHNIQUE: Multiplanar, multisequence MR imaging of the cervical spine was
performed. No intravenous contrast was administered.

[Series 3: T2 · sagittal · 3.0mm · 0.66mm/px · 8 of 18 slices shown (1 of 2)]
[im 1/18]
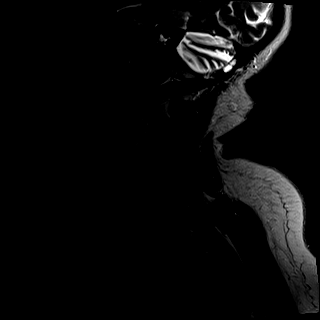
[im 3/18]
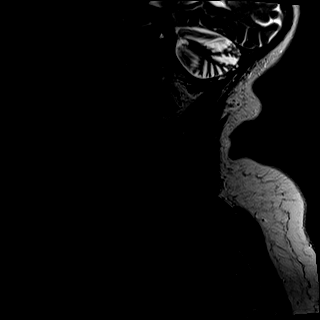
[im 5/18]
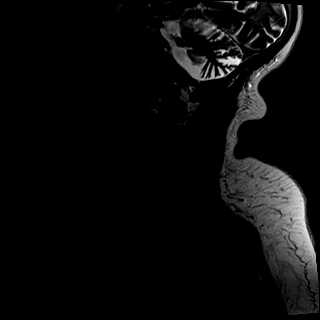
[im 8/18]
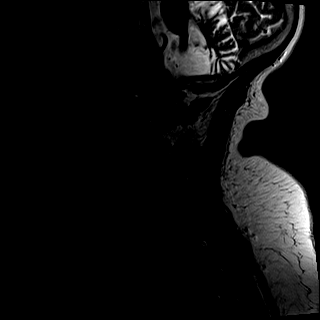
[im 10/18]
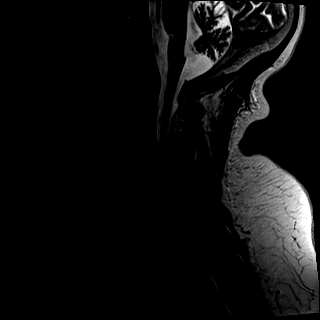
[im 13/18]
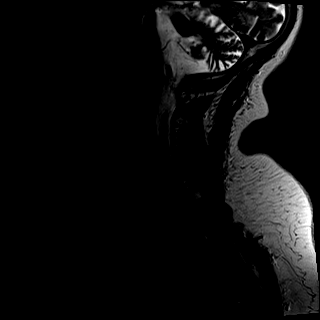
[im 15/18]
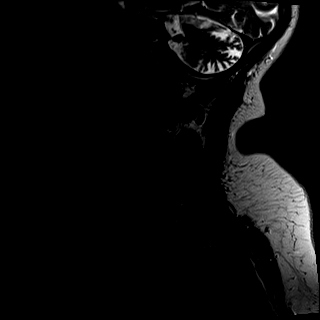
[im 18/18]
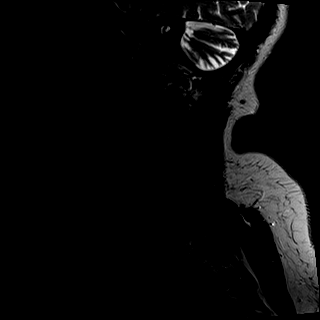

[Series 4: T1 · sagittal · 3.0mm · 0.41mm/px · 8 of 18 slices shown]
[im 1/18]
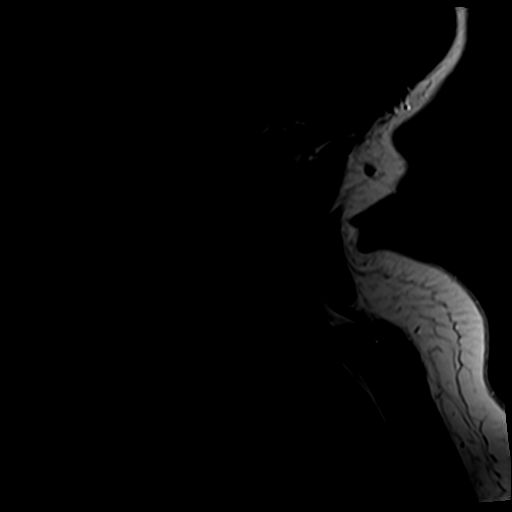
[im 3/18]
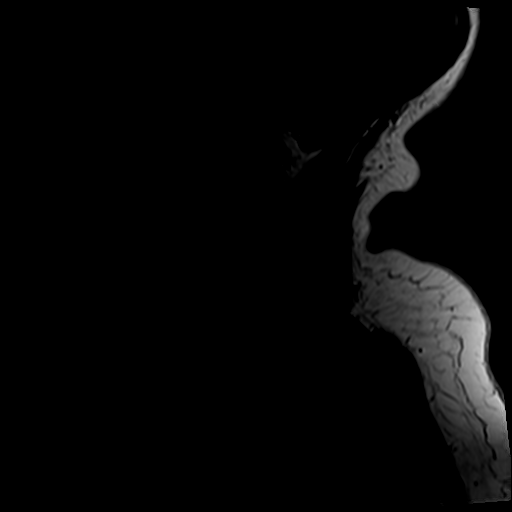
[im 5/18]
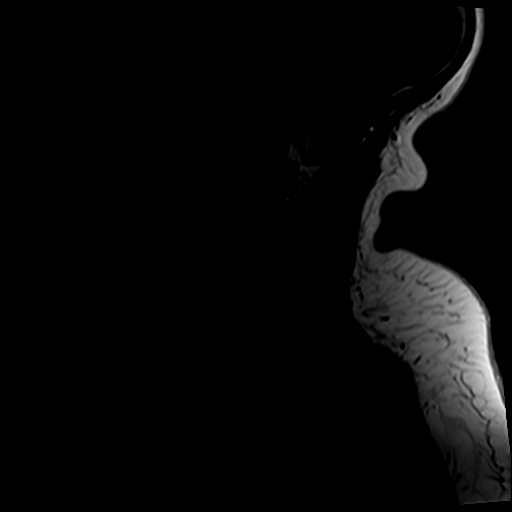
[im 8/18]
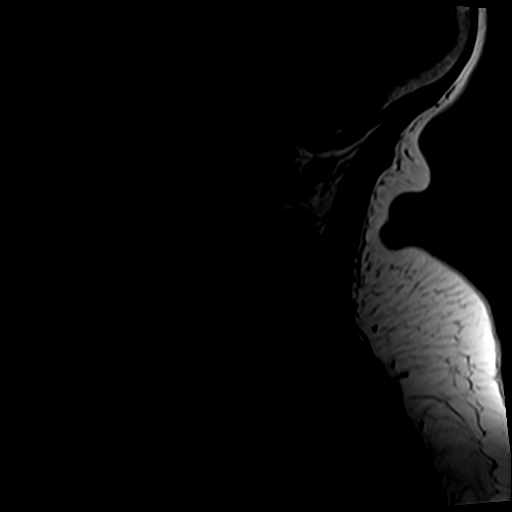
[im 10/18]
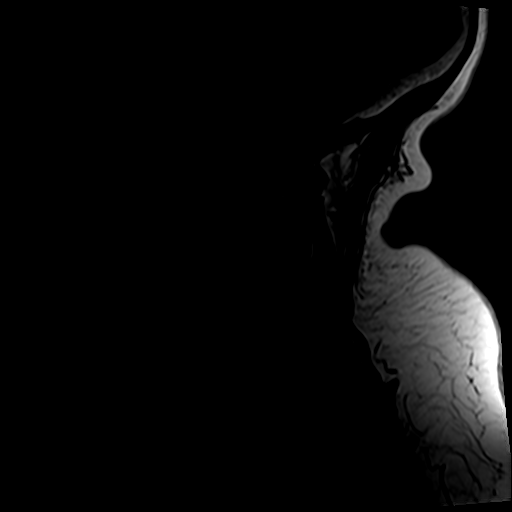
[im 13/18]
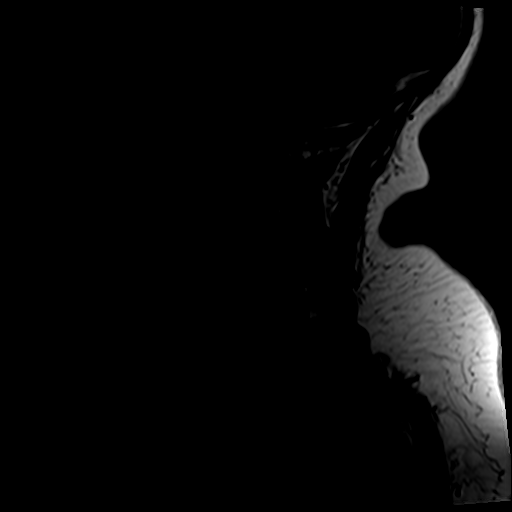
[im 15/18]
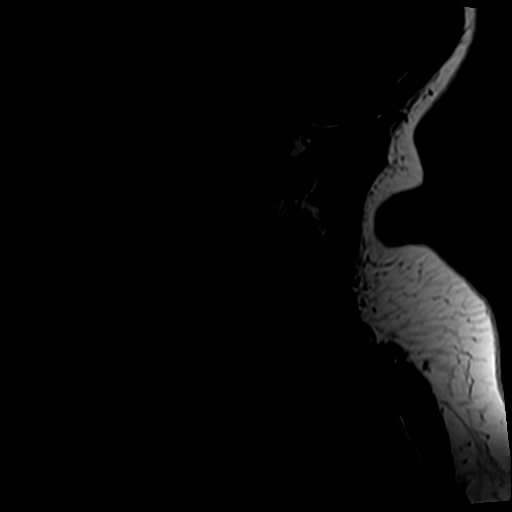
[im 18/18]
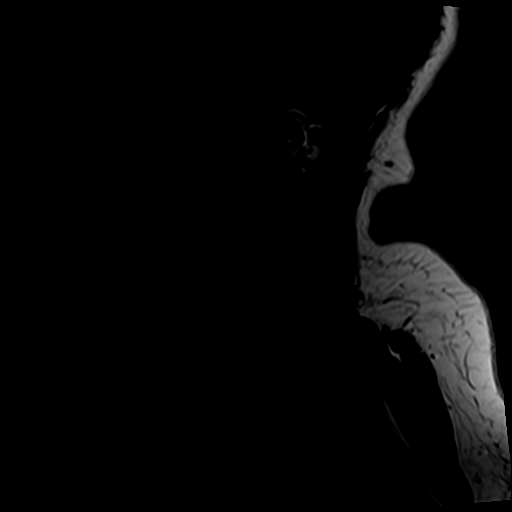

[Series 5: tir sag · sagittal · 3.0mm · 0.41mm/px · 5 of 18 slices shown]
[im 1/18]
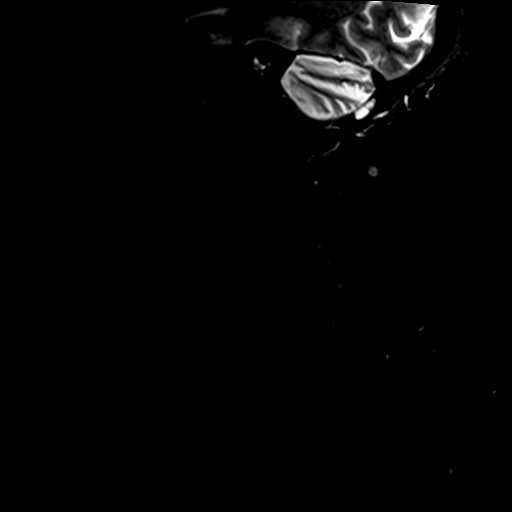
[im 3/18]
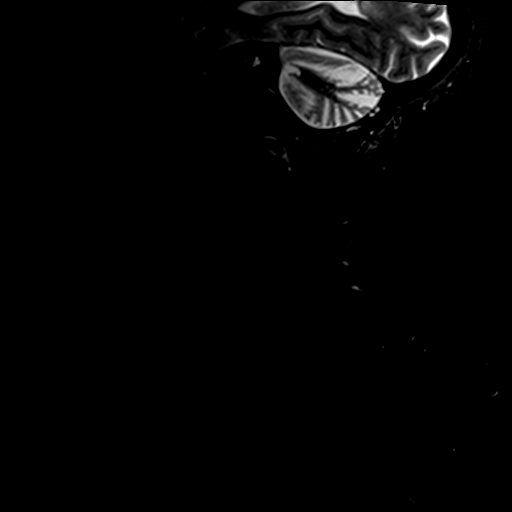
[im 5/18]
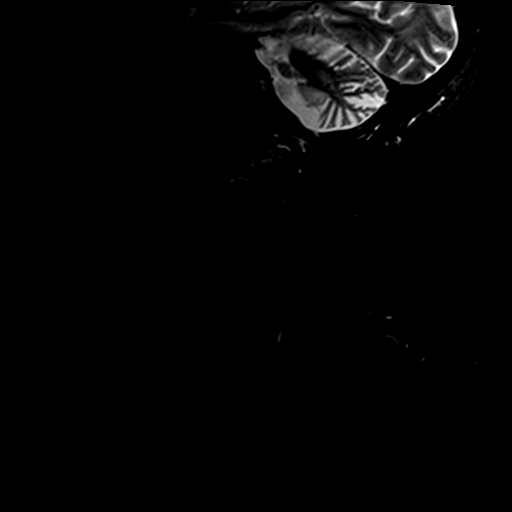
[im 10/18]
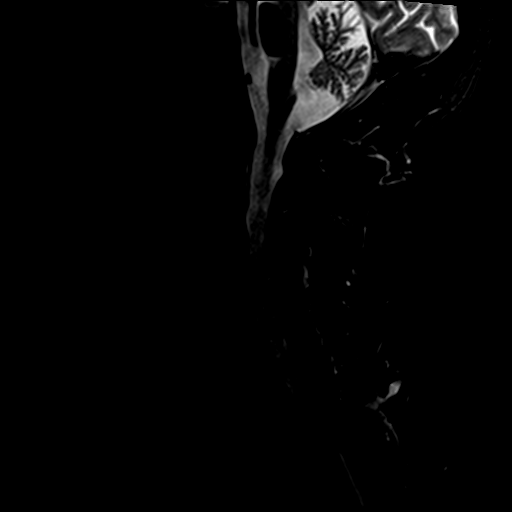
[im 15/18]
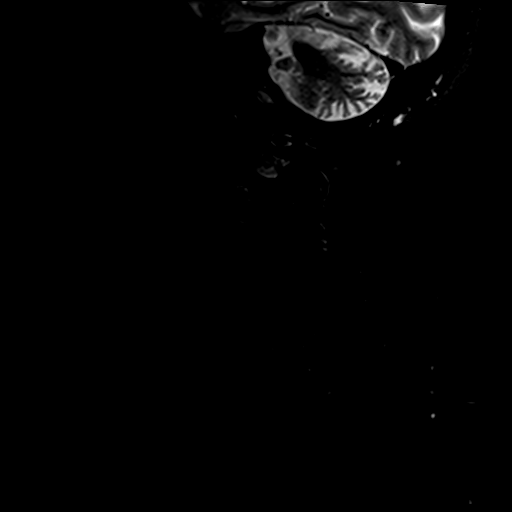

[Series 7: T2 · axial · 3.0mm · 0.70mm/px · z∈[-90,-5]mm · 9 of 25 slices shown (2 of 2)]
[im 1/25]
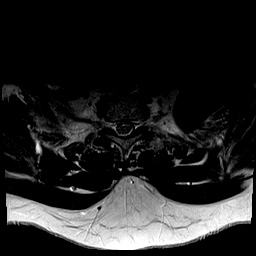
[im 5/25]
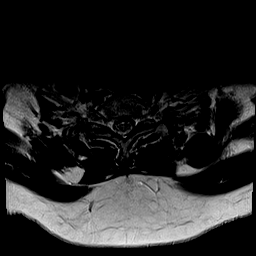
[im 7/25]
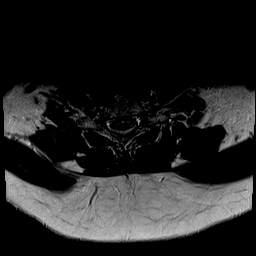
[im 11/25]
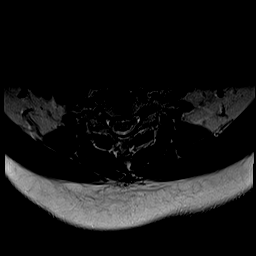
[im 14/25]
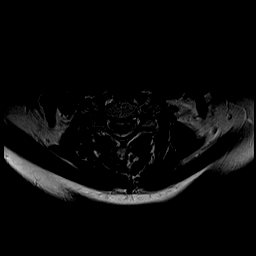
[im 18/25]
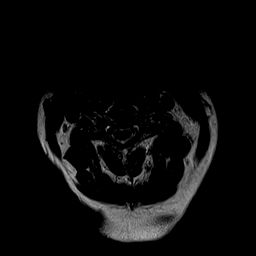
[im 20/25]
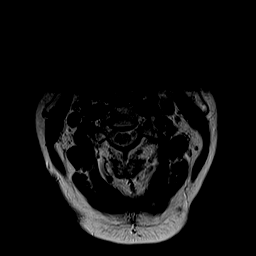
[im 22/25]
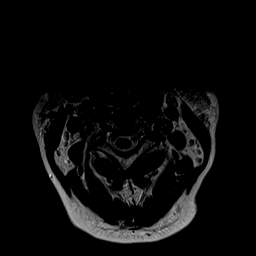
[im 25/25]
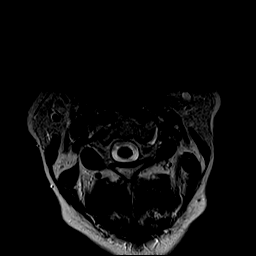

[30 of 48 positions shown; findings below may reference images not displayed]

FINDINGS: Alignment: Physiologic

Vertebrae: No fracture, evidence of discitis, or bone lesion.

Cord: Normal signal and morphology.

Posterior Fossa, vertebral arteries, paraspinal tissues: Marked
cerebellar atrophy

Disc levels:

C2-3: Unremarkable.

C3-4: Unremarkable.

C4-5: Disc narrowing and bulging with uncovertebral spurring.
Biforaminal impingement. Bulging disc effaces ventral CSF without
cord compression

C5-6: Ventral spondylitic spurring

C6-7: Disc narrowing and bulging with right foraminal protrusion
suggested on axial slices. Moderate right foraminal stenosis.

C7-T1:Unremarkable.

Other: Generalized mild motion artifact.
IMPRESSION: 1. C4-5 bilateral foraminal impingement due to disc degeneration
2. C6-7 moderate right foraminal stenosis due to disc
bulge/protrusion.
3. Marked cerebellar atrophy.

## 2020-08-17 IMAGING — MR MR SHOULDER*R* W/CM
6 series · 40 of 40 positions shown · IV contrast (agent unspecified)
Comparison: Plain films right shoulder [DATE].

CLINICAL DATA: Right shoulder pain for 6 months.  No known injury.

EXAM:
MR ARTHROGRAM OF THE RIGHT SHOULDER
TECHNIQUE: Multiplanar, multisequence MR imaging of the right shoulder was
performed following the administration of intra-articular contrast.
CONTRAST:  See Injection Documentation.

[Series 4: T1 fat-sat · axial · 4.0mm · 0.27mm/px · z∈[-32,+50]mm · 6 of 18 slices shown (1 of 4)]
[im 1/18]
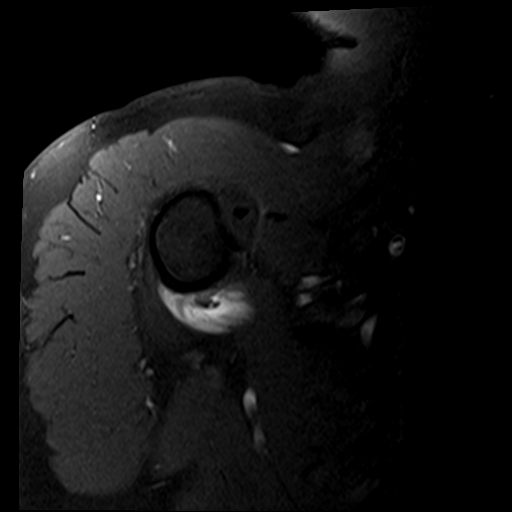
[im 4/18]
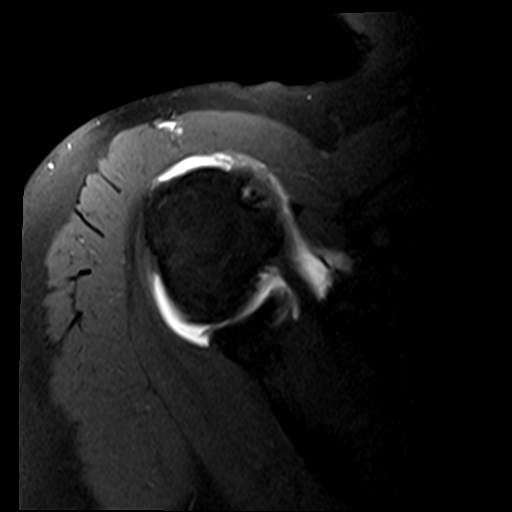
[im 7/18]
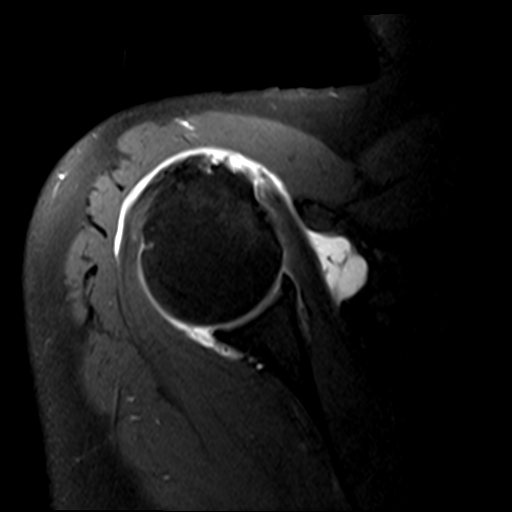
[im 11/18]
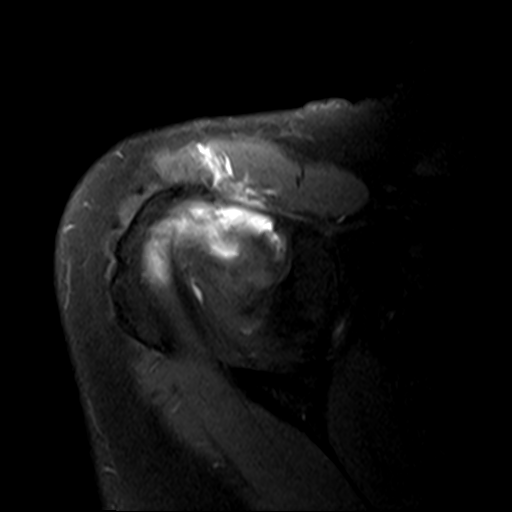
[im 14/18]
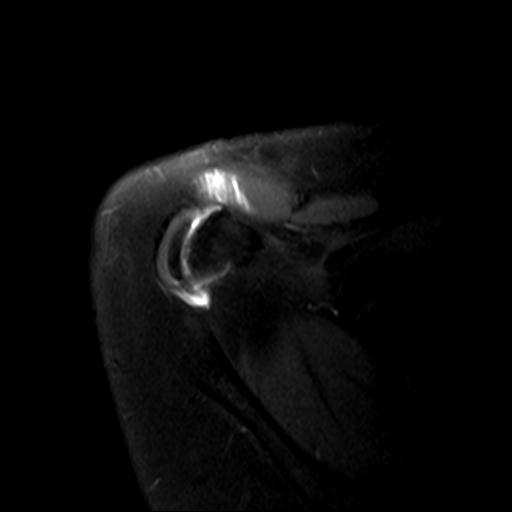
[im 18/18]
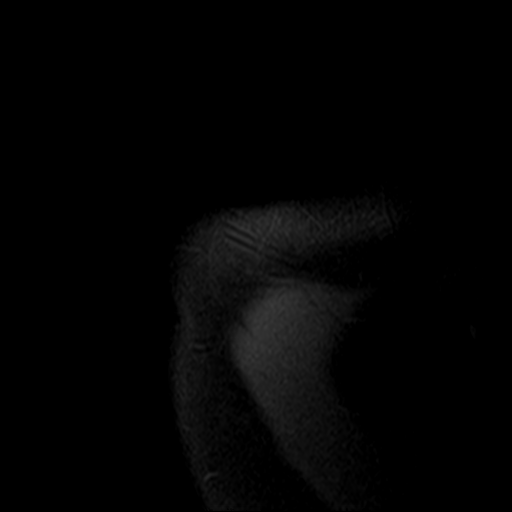

[Series 5: T2 fat-sat · coronal · 4.0mm · 0.55mm/px · 6 of 16 slices shown (1 of 2)]
[im 1/16]
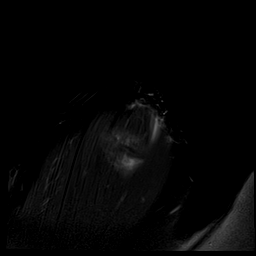
[im 4/16]
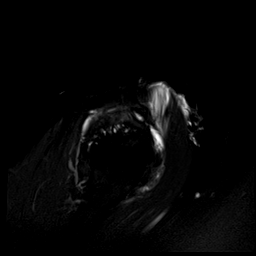
[im 7/16]
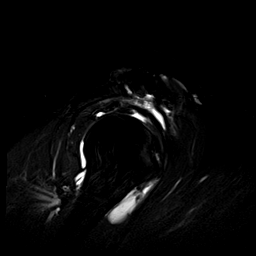
[im 10/16]
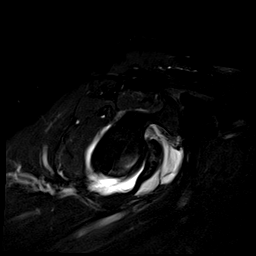
[im 13/16]
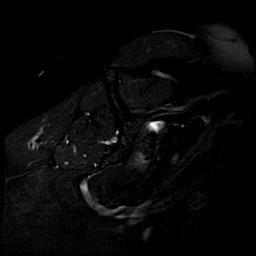
[im 16/16]
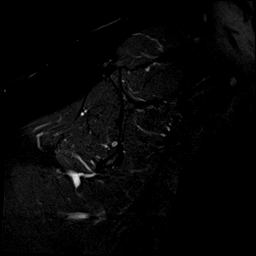

[Series 6: T1 fat-sat · sagittal · 4.0mm · 0.55mm/px · 7 of 16 slices shown (2 of 4)]
[im 1/16]
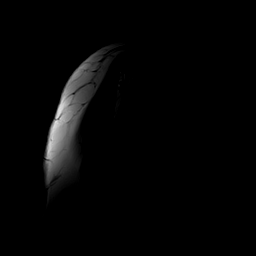
[im 3/16]
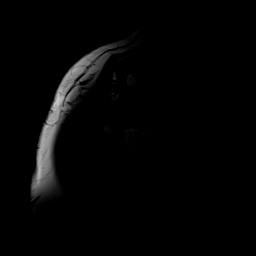
[im 6/16]
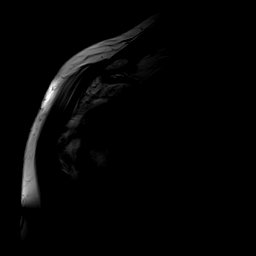
[im 8/16]
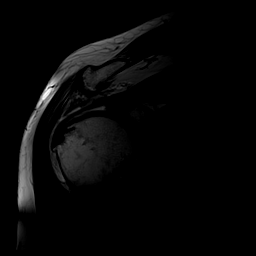
[im 11/16]
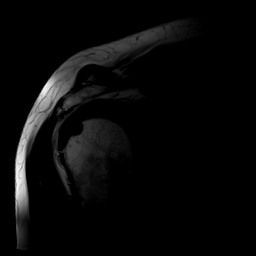
[im 13/16]
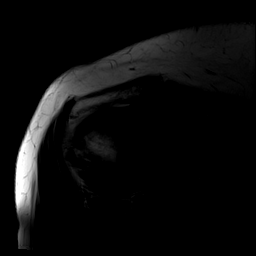
[im 16/16]
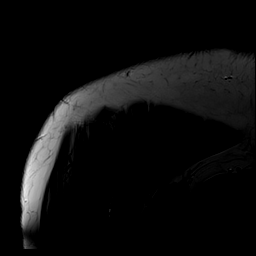

[Series 7: T1 fat-sat · sagittal · 4.0mm · 0.55mm/px · 7 of 16 slices shown (3 of 4)]
[im 1/16]
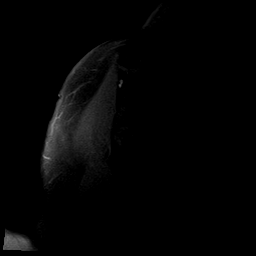
[im 3/16]
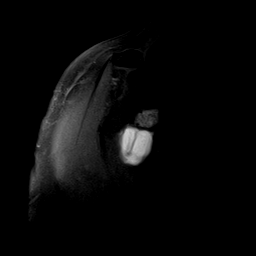
[im 6/16]
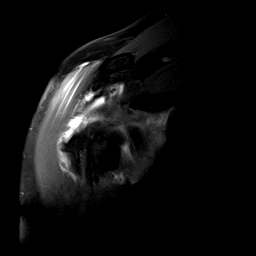
[im 8/16]
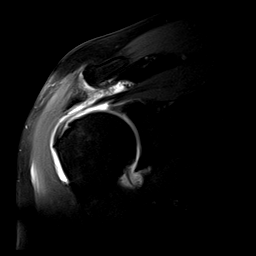
[im 11/16]
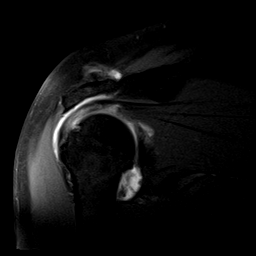
[im 13/16]
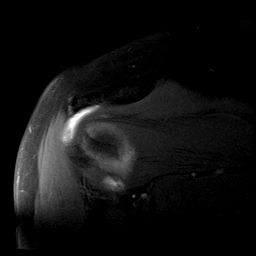
[im 16/16]
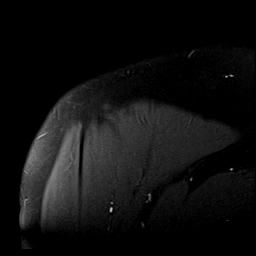

[Series 8: T2 fat-sat · sagittal · 4.0mm · 0.55mm/px · 7 of 16 slices shown (2 of 2)]
[im 1/16]
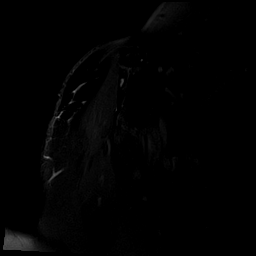
[im 3/16]
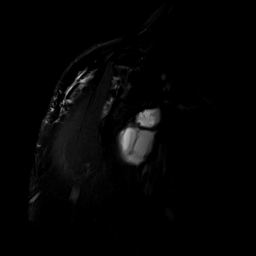
[im 6/16]
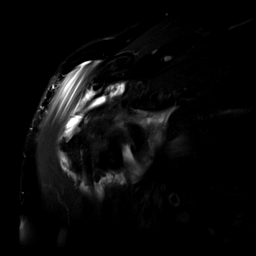
[im 8/16]
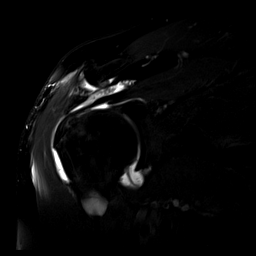
[im 11/16]
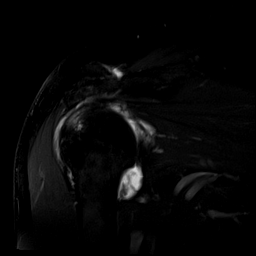
[im 13/16]
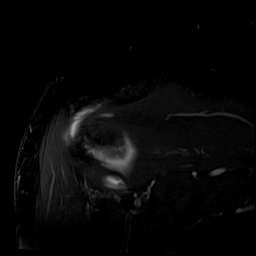
[im 16/16]
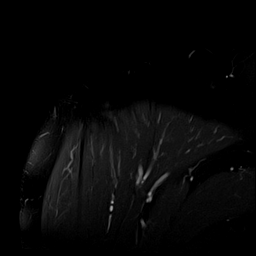

[Series 11: T1 fat-sat · oblique · 4.0mm · 0.59mm/px · 7 of 16 slices shown (4 of 4)]
[im 1/16]
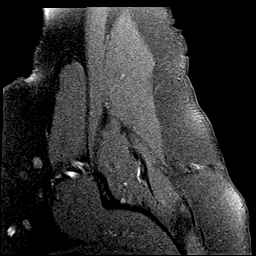
[im 3/16]
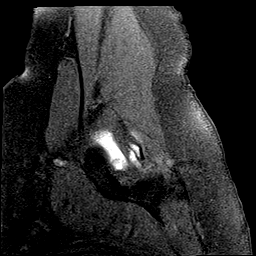
[im 6/16]
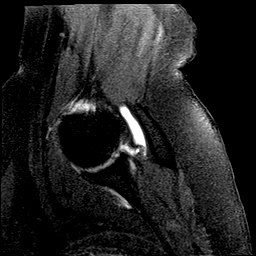
[im 8/16]
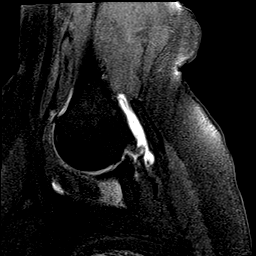
[im 11/16]
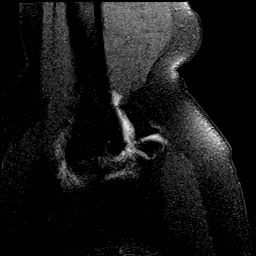
[im 13/16]
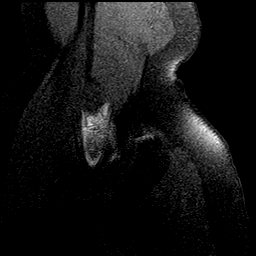
[im 16/16]
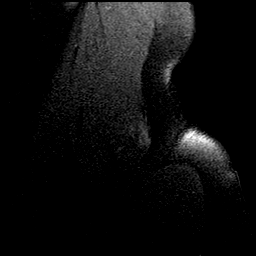

[40 of 40 positions shown; findings below may reference images not displayed]

FINDINGS: Rotator cuff: The patient has rotator cuff tendinopathy with a tear
of the far lateral supraspinatus. The leading edge of the tear is
full-thickness and measures approximately 1 cm from front to back.
The tear extends posteriorly for approximately 1 cm along the
undersurface of the tendon. Retraction is at and medial to the top
of the humeral head, 1.5-2.5 cm.

Muscles: No atrophy or focal lesion

Biceps long head: Intact. Tendinopathy of the intra-articular
segment noted.

Acromioclavicular Joint: Moderate to moderately severe
osteoarthritis. The acromion is type 2 with subacromial spurring.
Contrast extravasates into the subacromial/subdeltoid bursa.

Glenohumeral Joint: Distended with contrast.

Labrum: The superior labrum is degenerated and frayed.

Bones: No fracture, contusion or worrisome lesion.
IMPRESSION: Rotator cuff tendinopathy with a supraspinatus tendon tear. The
anterior 1 cm of the tear is full-thickness. More posteriorly, an
additional 1 cm of the tear involves the undersurface of the tendon.
Retraction is 1.5-2.5 cm. No atrophy.

Moderate to moderately severe acromioclavicular osteoarthritis. Type
2 acromion and subacromial spurring also noted.

Degenerated and frayed superior labrum.

## 2020-08-17 MED ORDER — IOPAMIDOL (ISOVUE-M 300) INJECTION 61%
12.0000 mL | Freq: Once | INTRAMUSCULAR | Status: AC | PRN
  Administered 2020-08-17: 12 mL via INTRA_ARTICULAR

## 2020-08-22 ENCOUNTER — Ambulatory Visit: Payer: 59 | Admitting: Psychology

## 2020-08-25 ENCOUNTER — Other Ambulatory Visit: Payer: Self-pay

## 2020-08-25 ENCOUNTER — Encounter: Payer: Self-pay | Admitting: Surgical

## 2020-08-25 ENCOUNTER — Ambulatory Visit (INDEPENDENT_AMBULATORY_CARE_PROVIDER_SITE_OTHER): Payer: 59 | Admitting: Surgical

## 2020-08-25 DIAGNOSIS — M542 Cervicalgia: Secondary | ICD-10-CM

## 2020-08-25 NOTE — Progress Notes (Signed)
Office Visit Note   [name]           Date of Birth: [birth year]           MRN: [id number] Visit Date: 08/25/2020 Requested by: No referring provider defined for this encounter. PCP: No primary care provider on file.  Subjective: Chief Complaint  Patient presents with   Right Shoulder - Pain    HPI: Brittany Klein is a 65 y.o. female who presents to the office for MRI review. Patient denies any changes in symptoms.  Continues to complain mainly of right shoulder pain in the lateral shoulder as well as shoulder blade pain and axial and paraspinal neck pain.  MRI results revealed: MR Cervical Spine w/o contrast  Result Date: 08/18/2020 CLINICAL DATA:  Cervical spine stenosis. Neck pain extending down both arms for 6-12 months. EXAM: MRI CERVICAL SPINE WITHOUT CONTRAST TECHNIQUE: Multiplanar, multisequence MR imaging of the cervical spine was performed. No intravenous contrast was administered. COMPARISON:  None. FINDINGS: Alignment: Physiologic Vertebrae: No fracture, evidence of discitis, or bone lesion. Cord: Normal signal and morphology. Posterior Fossa, vertebral arteries, paraspinal tissues: Marked cerebellar atrophy Disc levels: C2-3: Unremarkable. C3-4: Unremarkable. C4-5: Disc narrowing and bulging with uncovertebral spurring. Biforaminal impingement. Bulging disc effaces ventral CSF without cord compression C5-6: Ventral spondylitic spurring C6-7: Disc narrowing and bulging with right foraminal protrusion suggested on axial slices. Moderate right foraminal stenosis. C7-T1:Unremarkable. Other: Generalized mild motion artifact. IMPRESSION: 1. C4-5 bilateral foraminal impingement due to disc degeneration 2. C6-7 moderate right foraminal stenosis due to disc bulge/protrusion. 3. Marked cerebellar atrophy. Electronically Signed   By: Marnee Spring M.D.   On: 08/18/2020 16:44   MR SHOULDER RIGHT W CONTRAST  Result Date: 08/19/2020 CLINICAL DATA:  Right shoulder pain for 6  months.  No known injury. EXAM: MR ARTHROGRAM OF THE RIGHT SHOULDER TECHNIQUE: Multiplanar, multisequence MR imaging of the right shoulder was performed following the administration of intra-articular contrast. CONTRAST:  See Injection Documentation. COMPARISON:  Plain films right shoulder 06/23/2020. FINDINGS: Rotator cuff: The patient has rotator cuff tendinopathy with a tear of the far lateral supraspinatus. The leading edge of the tear is full-thickness and measures approximately 1 cm from front to back. The tear extends posteriorly for approximately 1 cm along the undersurface of the tendon. Retraction is at and medial to the top of the humeral head, 1.5-2.5 cm. Muscles: No atrophy or focal lesion Biceps long head: Intact. Tendinopathy of the intra-articular segment noted. Acromioclavicular Joint: Moderate to moderately severe osteoarthritis. The acromion is type 2 with subacromial spurring. Contrast extravasates into the subacromial/subdeltoid bursa. Glenohumeral Joint: Distended with contrast. Labrum: The superior labrum is degenerated and frayed. Bones: No fracture, contusion or worrisome lesion. IMPRESSION: Rotator cuff tendinopathy with a supraspinatus tendon tear. The anterior 1 cm of the tear is full-thickness. More posteriorly, an additional 1 cm of the tear involves the undersurface of the tendon. Retraction is 1.5-2.5 cm. No atrophy. Moderate to moderately severe acromioclavicular osteoarthritis. Type 2 acromion and subacromial spurring also noted. Degenerated and frayed superior labrum. Electronically Signed   By: Drusilla Kanner M.D.   On: 08/19/2020 11:09                 ROS: All systems reviewed are negative as they relate to the chief complaint within the history of present illness.  Patient denies fevers or chills.  Assessment & Plan: Visit Diagnoses:  1. Neck pain     Plan: Brittany Klein  is a 65 y.o. female who presents to the office complaining of right shoulder and neck pain.  She  has had 2 MRI scans one of the right shoulder and one of the cervical spine that both demonstrate pathology that may cause right shoulder pain.  She has no real injury that would cause the rotator cuff tear but seems to be more of a chronic insidious injury.  With the associated neck pain and shoulder blade pain, plan to try cervical spine epidural steroid injection with Dr. Alvester Morin and then follow-up with Dr. August Saucer to see how much relief this provided.  If little to no lasting relief from the [facility], may need to consider rotator cuff repair and distal clavicle excision.  Patient agreed with this plan and follow-up after injection.  Follow-Up Instructions: No follow-ups on file.   Orders:  Orders Placed This Encounter  Procedures   Ambulatory referral to Physical Medicine Rehab   No orders of the defined types were placed in this encounter.     Procedures: No procedures performed   Clinical Data: No additional findings.  Objective: Vital Signs: There were no vitals taken for this visit.  Physical Exam:  Constitutional: Patient appears well-developed HEENT:  Head: Normocephalic Eyes:EOM are normal Neck: Normal range of motion Cardiovascular: Normal rate Pulmonary/chest: Effort normal Neurologic: Patient is alert Skin: Skin is warm Psychiatric: Patient has normal mood and affect  Ortho Exam: Ortho exam demonstrates tenderness throughout the axial cervical spine with pain with cervical spine range of motion.  Patient also has moderate tenderness over the [facility] joint on the right-hand side with no such tenderness on the left.  She has excellent rotator cuff strength of supra, infra, subscap but she does have increased pain with resisted supraspinatus and external rotation of the right shoulder.  She is able to actively lift her arm overhead without difficulty.  Specialty Comments:  No specialty comments available.  Imaging: No results found.   PMFS History: Patient Active Problem  List   Diagnosis Date Noted   Mixed hyperlipidemia 06/03/2018   Type 2 diabetes mellitus with diabetic neuropathy, with long-term current use of insulin (HCC) 03/04/2018   Prolapse of female bladder, acquired 03/04/2018   Past Medical History:  Diagnosis Date   Anemia    Arthritis    "knees, hands" (08/04/2012)   Chronic lower back pain    "all the time" (08/04/2012)   Dysrhythmia    Tachycardia, comes and goes. Increases with activity   History of blood transfusion 2004   "related to knee replacement" (08/04/2012)   Hyperlipemia    Type II diabetes mellitus (HCC)     Family History  Problem Relation Age of Onset   Diabetes Mother    Cancer Mother    Diabetes Father    Cancer Sister    Heart disease Sister    Diabetes Sister     Past Surgical History:  Procedure Laterality Date   CARPAL TUNNEL RELEASE Left 1990's   HERNIA REPAIR  2000   umbilical   JOINT REPLACEMENT     TOTAL KNEE ARTHROPLASTY Left 10/2002   TOTAL KNEE REVISION Left 08/04/2012   TOTAL KNEE REVISION Left 08/04/2012   Procedure: LEFT TOTAL KNEE REVISION;  Surgeon: Cammy Copa, MD;  Location: [facility] OR;  Service: Orthopedics;  Laterality: Left;   TUBAL LIGATION  1980   Social History   Occupational History   Not on file  Tobacco Use   Smoking status: Former    Packs/day: 1.00  Years: 23.00    Pack years: 23.00    Types: Cigarettes    Quit date: 12/01/1998    Years since quitting: 21.7   Smokeless tobacco: Never  Substance and Sexual Activity   Alcohol use: Yes    Comment: 08/05/2012 "don't drink anymore; stopped ~ 11/1998; never had problem w/it"   Drug use: No   Sexual activity: Never

## 2020-08-29 ENCOUNTER — Ambulatory Visit (INDEPENDENT_AMBULATORY_CARE_PROVIDER_SITE_OTHER): Payer: 59 | Admitting: Psychology

## 2020-08-29 DIAGNOSIS — F3289 Other specified depressive episodes: Secondary | ICD-10-CM

## 2020-08-30 ENCOUNTER — Ambulatory Visit: Payer: 59 | Admitting: Orthopedic Surgery

## 2020-08-31 ENCOUNTER — Ambulatory Visit: Payer: 59 | Admitting: Orthopedic Surgery

## 2020-09-05 ENCOUNTER — Telehealth: Payer: Self-pay | Admitting: Physical Medicine and Rehabilitation

## 2020-09-05 NOTE — Telephone Encounter (Signed)
Pt call about referral with [facility]? Wanting to know when can she get scheduled?   CB [phone]

## 2020-09-12 ENCOUNTER — Ambulatory Visit (INDEPENDENT_AMBULATORY_CARE_PROVIDER_SITE_OTHER): Payer: 59 | Admitting: Psychology

## 2020-09-12 DIAGNOSIS — F3289 Other specified depressive episodes: Secondary | ICD-10-CM

## 2020-09-20 ENCOUNTER — Other Ambulatory Visit: Payer: Self-pay

## 2020-09-20 ENCOUNTER — Ambulatory Visit (INDEPENDENT_AMBULATORY_CARE_PROVIDER_SITE_OTHER): Payer: 59 | Admitting: Orthopedic Surgery

## 2020-09-20 DIAGNOSIS — M542 Cervicalgia: Secondary | ICD-10-CM | POA: Diagnosis not present

## 2020-09-20 MED ORDER — TRAMADOL HCL 50 MG PO TABS: 50.0000 mg | ORAL_TABLET | Freq: Three times a day (TID) | ORAL | 0 refills | Status: DC | PRN

## 2020-09-20 MED ORDER — METHOCARBAMOL 500 MG PO TABS
500.0000 mg | ORAL_TABLET | Freq: Three times a day (TID) | ORAL | 0 refills | Status: DC | PRN
Start: 2020-09-20 — End: 2022-02-01

## 2020-09-25 ENCOUNTER — Encounter: Payer: Self-pay | Admitting: Orthopedic Surgery

## 2020-09-25 NOTE — Progress Notes (Signed)
Her  Office Visit Note   [name]           Date of Birth: 07-Aug-1955           MRN: [id number] Visit Date: 09/20/2020 Requested by: No referring provider defined for this encounter. PCP: Brittany Canary, PA-C  Subjective: Chief Complaint  Patient presents with   Neck - Pain   Right Shoulder - Pain    HPI: [name] is a patient with neck and right shoulder pain.  States that her neck and right shoulder all the time.  Especially at night.  She has cramping on both sides.  Connected comfortable at night.  Refers shoulder pain wakes her up from sleep at night.  Shoulder subacromial injection gave her no help.  The pain does radiate to the elbow.  MRI scan of the shoulder shows 1-1/2 cm rotator cuff tear on the right.  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  She has had symptoms now for over 8 weeks.  She has been taking over-the-counter medication without much relief              ROS: All systems reviewed are negative as they relate to the chief complaint within the history of present illness.  Patient denies  fevers or chills.   Assessment & Plan: Visit Diagnoses:  1. Neck pain     Plan: Impression is neck pain with radiculopathy and rotator cuff pathology.  Based on findings and response to intervention I think it is most likely that her neck is the primary culprit.  Before embarking on extensive and invasive shoulder surgery cervical spine ESI is indicated.  Physical therapy 1 time a week for 2 weeks plus home exercise program for neck arthritis.  Plan for epidural steroid injection in 6 weeks.  Ultram and Robaxin filled.  Follow-up with me in 6 to 8 weeks for clinical recheck and evaluation about surgical versus nonsurgical management of the shoulder and/or neck.  Follow-Up Instructions: Return in about 8 weeks (around 11/15/2020).   Orders:  Orders Placed This Encounter  Procedures    Ambulatory referral to Physical Medicine Rehab   Ambulatory referral to Physical Therapy   Meds ordered this encounter  Medications   traMADol (ULTRAM) 50 MG tablet    Sig: Take 1 tablet (50 mg total) by mouth every 8 (eight) hours as needed.    Dispense:  30 tablet    Refill:  0   methocarbamol (ROBAXIN) 500 MG tablet    Sig: Take 1 tablet (500 mg total) by mouth every 8 (eight) hours as needed for muscle spasms.    Dispense:  30 tablet    Refill:  0      Procedures: No procedures performed   Clinical Data: No additional findings.  Objective: Vital Signs: There were no vitals taken for this visit.  Physical Exam:   Constitutional: Patient appears well-developed HEENT:  Head: Normocephalic Eyes:EOM are normal Neck: Normal range of motion Cardiovascular: Normal rate Pulmonary/chest: Effort normal Neurologic: Patient is alert Skin: Skin is warm Psychiatric: Patient has normal mood and affect   Ortho Exam: Ortho exam demonstrates good cervical spine range of motion with some reproduction of symptoms with rotation of the head to the right.  No muscle atrophy right arm versus left arm.  Radial pulses intact.  Pretty reasonable passive range of motion bilaterally with good strength and 5 out of 5  infraspinatus supraspinatus and subscap.  Does have some pain with resisted AB duction.  Specialty Comments:  No specialty comments available.  Imaging: No results found.   PMFS History: Patient Active Problem List   Diagnosis Date Noted   Mixed hyperlipidemia 06/03/2018   Type 2 diabetes mellitus with diabetic neuropathy, with long-term current use of insulin (HCC) 03/04/2018   Prolapse of female bladder, acquired 03/04/2018   Past Medical History:  Diagnosis Date   Anemia    Arthritis    "knees, hands" (08/04/2012)   Chronic lower back pain    "all the time" (08/04/2012)   Dysrhythmia    Tachycardia, comes and goes. Increases with activity   History of blood  transfusion 2004   "related to knee replacement" (08/04/2012)   Hyperlipemia    Type II diabetes mellitus (HCC)     Family History  Problem Relation Age of Onset   Diabetes Mother    Cancer Mother    Diabetes Father    Cancer Sister    Heart disease Sister    Diabetes Sister     Past Surgical History:  Procedure Laterality Date   CARPAL TUNNEL RELEASE Left 1990's   HERNIA REPAIR  2000   umbilical   JOINT REPLACEMENT     TOTAL KNEE ARTHROPLASTY Left 10/2002   TOTAL KNEE REVISION Left 08/04/2012   TOTAL KNEE REVISION Left 08/04/2012   Procedure: LEFT TOTAL KNEE REVISION;  Surgeon: Cammy Copa, MD;  Location: [facility] OR;  Service: Orthopedics;  Laterality: Left;   TUBAL LIGATION  1980   Social History   Occupational History   Not on file  Tobacco Use   Smoking status: Former    Packs/day: 1.00    Years: 23.00    Pack years: 23.00    Types: Cigarettes    Quit date: 12/01/1998    Years since quitting: 21.8   Smokeless tobacco: Never  Substance and Sexual Activity   Alcohol use: Yes    Comment: 08/05/2012 "don't drink anymore; stopped ~ 11/1998; never had problem w/it"   Drug use: No   Sexual activity: Never

## 2020-09-28 ENCOUNTER — Ambulatory Visit (INDEPENDENT_AMBULATORY_CARE_PROVIDER_SITE_OTHER): Payer: 59 | Admitting: Physical Therapy

## 2020-09-28 ENCOUNTER — Other Ambulatory Visit: Payer: Self-pay

## 2020-09-28 ENCOUNTER — Encounter: Payer: Self-pay | Admitting: Physical Therapy

## 2020-09-28 DIAGNOSIS — M6281 Muscle weakness (generalized): Secondary | ICD-10-CM | POA: Diagnosis not present

## 2020-09-28 DIAGNOSIS — M542 Cervicalgia: Secondary | ICD-10-CM

## 2020-09-28 DIAGNOSIS — R293 Abnormal posture: Secondary | ICD-10-CM

## 2020-09-28 NOTE — Patient Instructions (Signed)
Access Code: [email or web address] URL: [email or web address] Date: 09/28/2020 Prepared by: Moshe Cipro  Exercises Standing Backward Shoulder Rolls - 2 x daily - 7 x weekly - 10 reps - 1 sets Seated Scapular Retraction - 2 x daily - 7 x weekly - 10 reps - 1 sets - 5 sec hold Seated Cervical Retraction - 2 x daily - 7 x weekly - 10 reps - 1 sets - 5 sec hold Seated Upper Trapezius Stretch - 2 x daily - 7 x weekly - 3 reps - 1 sets - 30 sec hold

## 2020-09-28 NOTE — Therapy (Signed)
[facility] [address] [place], [place], [id number] Phone: [phone]   Fax:  [phone]  Physical Therapy Evaluation  Patient Details  Name: Brittany Klein MRN: [id number] Date of Birth: 09/14/1955 Referring Provider (PT): August Saucerean, Corrie MckusickGregory Scott, MD   Encounter Date: 09/28/2020   PT End of Session - 09/28/20 1118     Visit Number 1    Number of Visits 3    Date for PT Re-Evaluation 11/09/20    Authorization Type [employer]    PT Start Time 0801    PT Stop Time 0845    PT Time Calculation (min) 44 min    Activity Tolerance Patient tolerated treatment well    Behavior [facility] for tasks assessed/performed             Past Medical History:  Diagnosis Date   Anemia    Arthritis    "knees, hands" (08/04/2012)   Chronic lower back pain    "all the time" (08/04/2012)   Dysrhythmia    Tachycardia, comes and goes. Increases with activity   History of blood transfusion 2004   "related to knee replacement" (08/04/2012)   Hyperlipemia    Type II diabetes mellitus (HCC)     Past Surgical History:  Procedure Laterality Date   CARPAL TUNNEL RELEASE Left 1990's   HERNIA REPAIR  2000   umbilical   JOINT REPLACEMENT     TOTAL KNEE ARTHROPLASTY Left 10/2002   TOTAL KNEE REVISION Left 08/04/2012   TOTAL KNEE REVISION Left 08/04/2012   Procedure: LEFT TOTAL KNEE REVISION;  Surgeon: Cammy CopaGregory Scott Dean, MD;  [facility];  Service: Orthopedics;  Laterality: Left;   TUBAL LIGATION  1980    There were no vitals filed for this visit.    Subjective Assessment - 09/28/20 0804     Subjective Pt is a 65 y/o female who presents to OPPT for Rt shoulder and neck pain x 6-12 months. She had a cortisone injection in shoulder that didn't help, and then pain began to radiate to neck including Lt side.    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    Patient Stated Goals improve pain    Currently in Pain? Yes    Pain Score 7    up to 9/10, at best 0/10   Pain Location Neck    Pain Orientation Right;Left;Upper;Posterior    Pain Descriptors / Indicators Sharp;Stabbing;Aching;Cramping    Pain Type Chronic pain    Pain Onset More than a month ago    Pain Frequency Intermittent    Aggravating Factors  sleep (worse at night), turning head and reaching overhead    Pain Relieving Factors tramadol, heat                [facility] PT Assessment - 09/28/20 0810       Assessment   Medical Diagnosis M54.2 (ICD-10-CM) - Neck pain    Referring Provider (PT) Cammy Copaean, Gregory Scott, MD    Onset Date/Surgical Date --   6-12 month   Hand Dominance Right    Next MD Visit 6-8 weeks    Prior Therapy knees (2004)      Precautions   Precautions None      Restrictions   Weight Bearing Restrictions No      Balance Screen   Has the  patient fallen in the past 6 months No    Has the patient had a decrease in activity level because of a fear of falling?  No    Is the patient reluctant to leave their home because of a fear of falling?  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    Leisure watch TV, has Y Research scientist (physical sciences) - goes to chair exercise 2-3x/wk      Cognition   Overall Cognitive Status Within Functional Limits for tasks assessed      Observation/Other Assessments   Focus on Therapeutic Outcomes (FOTO)  32 (predicted 51)      Posture/Postural Control   Posture/Postural Control Postural limitations    Postural Limitations Rounded Shoulders;Forward head;Increased thoracic kyphosis      ROM / Strength   AROM / PROM / Strength AROM;Strength      AROM    Overall AROM Comments pain with flexion/abduction Rt shoulder and limited 25%    AROM Assessment Site Cervical    Cervical Flexion 30    Cervical Extension 22    Cervical - Right Side Bend 30    Cervical - Left Side Bend 25   with Rt side pulling     Strength   Strength Assessment Site Shoulder    Right/Left Shoulder Right;Left    Right Shoulder Flexion 3-/5    Right Shoulder ABduction 3-/5    Right Shoulder Internal Rotation 5/5    Right Shoulder External Rotation 4/5    Left Shoulder Flexion 3+/5    Left Shoulder ABduction 3+/5    Left Shoulder Internal Rotation 5/5    Left Shoulder External Rotation 3/5      Palpation   Palpation comment trigger points and tightness noted throughout bil cervical paraspinals, upper traps, levator scapula      Special Tests    Special Tests Cervical    Cervical Tests Spurling's;Dictraction      Spurling's   Findings Positive    Side Right    Comment increased pain into Rt shoulder      Distraction Test   Findngs Positive    Comment improved symptoms                        Objective measurements completed on examination: See above findings.       [facility] Adult PT Treatment/Exercise - 09/28/20 0810       Exercises   Exercises Other Exercises    Other Exercises  see pt instructions for details - pt performed 1-3 reps of each exercise with mod cues for technique      Modalities   Modalities Traction      Traction   Type of Traction Cervical    Min (lbs) 18    Max (lbs) 13    Hold Time 60    Rest Time 20    Time 8 min - pt with oil based lotion on, so equipment did not stay well, had to stop treatment early                     PT Education - 09/28/20 1118     Education Details HEP  Person(s) Educated Patient    Methods Explanation;Demonstration;Handout    Comprehension Verbalized understanding;Returned demonstration;Need further instruction              PT Short Term Goals - 09/28/20 1121        PT SHORT TERM GOAL #1   Title independent with initial HEP    Time 2    Period Weeks    Target Date 10/12/20               PT Long Term Goals - 09/28/20 0803       PT LONG TERM GOAL #1   Title Independent with final HEP    Time 6    Period Weeks    Status New    Target Date 11/09/20      PT LONG TERM GOAL #2   Title FOTO score improved to 51 for improved function    Time 6    Period Weeks    Status New    Target Date 11/09/20      PT LONG TERM GOAL #3   Title perform cervical AROM without increase in pain for improved mobility    Time 6    Period Weeks    Status New    Target Date 11/09/20      PT LONG TERM GOAL #4   Title Report pain < 4/10 for improved function    Time 6    Period Weeks    Status New    Target Date 11/09/20      PT LONG TERM GOAL #5   Title Demonstrate at least 4/5 strength in bil UEs for improved function    Time 6    Period Weeks    Status New    Target Date 11/09/20                    Plan - 09/28/20 1118     Clinical Impression Statement Pt is a 65 y/o female who presents to OPPT for chronic neck and shoulder pain, with Rt shoulder more symptomatic than Lt.  Pt demonstrates decreased strength and ROM as well as postural abnormalities affecting functional mobility.  Will benefit from PT to address deficits listed.    Personal Factors and Comorbidities Comorbidity 1    Comorbidities DM    Examination-Activity Limitations Reach Overhead;Sleep;Carry;Lift    Examination-Participation Restrictions Church;Volunteer;Cleaning;Meal Prep;Community Activity    Stability/Clinical Decision Making Evolving/Moderate complexity    Clinical Decision Making Moderate    Rehab Potential Fair    PT Frequency Biweekly   pt requesting biweekly   PT Duration 6 weeks    PT Treatment/Interventions ADLs/Self Care Home Management;Cryotherapy;Electrical Stimulation;Moist Heat;Traction;Ultrasound;Therapeutic exercise;Therapeutic  activities;Functional mobility training;Neuromuscular re-education;Patient/family education;Manual techniques;Passive range of motion;Dry needling;Taping    PT Next Visit Plan review HEP, assess response to traction, consider trying again if able, progress HEP PRN    PT Home Exercise Plan Access Code: [email or web address]             Patient will benefit from skilled therapeutic intervention in order to improve the following deficits and impairments:  Increased fascial restricitons, Increased muscle spasms, Pain, Postural dysfunction, Decreased range of motion, Decreased strength, Impaired UE functional use  Visit Diagnosis: Cervicalgia - Plan: PT plan of care cert/re-cert  Abnormal posture - Plan: PT plan of care cert/re-cert  Muscle weakness (generalized) - Plan: PT plan of care cert/re-cert     Problem List Patient Active Problem List   Diagnosis Date Noted   Mixed  hyperlipidemia 06/03/2018   Type 2 diabetes mellitus with diabetic neuropathy, with long-term current use of insulin (HCC) 03/04/2018   Prolapse of female bladder, acquired 03/04/2018      Clarita Crane, PT, DPT 09/28/20 11:26 AM     [facility] [address] [place], [place], [address] Phone: [phone]   Fax:  [phone]  Name: Brittany Klein MRN: [id number] Date of Birth: [birth year]

## 2020-09-29 ENCOUNTER — Ambulatory Visit: Payer: 59 | Admitting: Physical Therapy

## 2020-10-03 ENCOUNTER — Ambulatory Visit (INDEPENDENT_AMBULATORY_CARE_PROVIDER_SITE_OTHER): Payer: 59 | Admitting: Psychology

## 2020-10-03 DIAGNOSIS — F3289 Other specified depressive episodes: Secondary | ICD-10-CM | POA: Diagnosis not present

## 2020-10-06 ENCOUNTER — Encounter: Payer: 59 | Admitting: Physical Therapy

## 2020-10-13 ENCOUNTER — Ambulatory Visit (INDEPENDENT_AMBULATORY_CARE_PROVIDER_SITE_OTHER): Payer: 59 | Admitting: Physical Therapy

## 2020-10-13 ENCOUNTER — Encounter: Payer: Self-pay | Admitting: Physical Therapy

## 2020-10-13 ENCOUNTER — Other Ambulatory Visit: Payer: Self-pay

## 2020-10-13 DIAGNOSIS — R293 Abnormal posture: Secondary | ICD-10-CM

## 2020-10-13 DIAGNOSIS — M542 Cervicalgia: Secondary | ICD-10-CM

## 2020-10-13 DIAGNOSIS — M6281 Muscle weakness (generalized): Secondary | ICD-10-CM

## 2020-10-13 NOTE — Therapy (Signed)
[facility] [address] [place], [place], [address] Phone: [phone]   Fax:  [phone]  Physical Therapy Treatment  Patient Details  Name: Brittany Klein MRN: [id number] Date of Birth: Mar 05, 1955 Referring Provider (PT): August Saucer Corrie Mckusick, MD   Encounter Date: 10/13/2020   PT End of Session - 10/13/20 0830     Visit Number 2    Number of Visits 3    Date for PT Re-Evaluation 11/09/20    Authorization Type [employer]    PT Start Time 0802    PT Stop Time 0829    PT Time Calculation (min) 27 min    Activity Tolerance Patient tolerated treatment well    Behavior During Therapy [facility] for tasks assessed/performed             Past Medical History:  Diagnosis Date   Anemia    Arthritis    "knees, hands" (08/04/2012)   Chronic lower back pain    "all the time" (08/04/2012)   Dysrhythmia    Tachycardia, comes and goes. Increases with activity   History of blood transfusion 2004   "related to knee replacement" (08/04/2012)   Hyperlipemia    Type II diabetes mellitus (HCC)     Past Surgical History:  Procedure Laterality Date   CARPAL TUNNEL RELEASE Left 1990's   HERNIA REPAIR  2000   umbilical   JOINT REPLACEMENT     TOTAL KNEE ARTHROPLASTY Left 10/2002   TOTAL KNEE REVISION Left 08/04/2012   TOTAL KNEE REVISION Left 08/04/2012   Procedure: LEFT TOTAL KNEE REVISION;  Surgeon: Cammy Copa, MD;  Location: [facility] OR;  Service: Orthopedics;  Laterality: Left;   TUBAL LIGATION  1980    There were no vitals filed for this visit.   Subjective Assessment - 10/13/20 0811     Subjective pain is still a 4/10, symptoms radiating into Rt elbow/forearm. 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    Patient Stated Goals improve pain    Currently in Pain? Yes     Pain Score 4     Pain Location Neck    Pain Orientation Right;Left;Upper;Posterior    Pain Descriptors / Indicators Aching;Cramping;Stabbing;Sharp    Pain Type Chronic pain    Pain Onset More than a month ago    Pain Frequency Intermittent                               [facility] Adult PT Treatment/Exercise - 10/13/20 0813       Self-Care   Self-Care Other Self-Care Comments    Other Self-Care Comments  discussed HEP and pt reports performing 2x/day, factors of pain and how to gauge improvements      Modalities   Modalities Traction      Traction   Type of Traction Cervical    Min (lbs) 10    Max (lbs) 15    Hold Time 60    Rest Time 20    Time 10                     PT Education - 10/13/20 0830     Education Details continue HEP, home traction options    Person(s) Educated Patient  Methods Explanation;Demonstration    Comprehension Verbalized understanding              PT Short Term Goals - 10/13/20 0830       PT SHORT TERM GOAL #1   Title independent with initial HEP    Time 2    Period Weeks    Status Achieved    Target Date 10/12/20               PT Long Term Goals - 10/13/20 0830       PT LONG TERM GOAL #1   Title Independent with final HEP    Time 6    Period Weeks    Status On-going    Target Date 11/09/20      PT LONG TERM GOAL #2   Title FOTO score improved to 51 for improved function    Time 6    Period Weeks    Status On-going    Target Date 11/09/20      PT LONG TERM GOAL #3   Title perform cervical AROM without increase in pain for improved mobility    Time 6    Period Weeks    Status On-going    Target Date 11/09/20      PT LONG TERM GOAL #4   Title Report pain < 4/10 for improved function    Time 6    Period Weeks    Status On-going    Target Date 11/09/20      PT LONG TERM GOAL #5   Title Demonstrate at least 4/5 strength in bil UEs for improved function    Time 6    Period  Weeks    Status On-going    Target Date 11/09/20                   Plan - 10/13/20 0831     Clinical Impression Statement Pt arriving today reporting decreased intensity in neck pain and able to tolerate traction today without difficulty and reduction in pain to 2/10 after session.  Scheduled additional f/u in 2 weeks to reassess.    Personal Factors and Comorbidities Comorbidity 1    Comorbidities DM    Examination-Activity Limitations Reach Overhead;Sleep;Carry;Lift    Examination-Participation Restrictions Church;Volunteer;Cleaning;Meal Prep;Community Activity    Stability/Clinical Decision Making Evolving/Moderate complexity    Rehab Potential Fair    PT Frequency Biweekly   pt requesting biweekly   PT Duration 6 weeks    PT Treatment/Interventions ADLs/Self Care Home Management;Cryotherapy;Electrical Stimulation;Moist Heat;Traction;Ultrasound;Therapeutic exercise;Therapeutic activities;Functional mobility training;Neuromuscular re-education;Patient/family education;Manual techniques;Passive range of motion;Dry needling;Taping    PT Next Visit Plan assess response to traction, consider trying again if able, progress HEP PRN    PT Home Exercise Plan Access Code: [email or web address]             Patient will benefit from skilled therapeutic intervention in order to improve the following deficits and impairments:  Increased fascial restricitons, Increased muscle spasms, Pain, Postural dysfunction, Decreased range of motion, Decreased strength, Impaired UE functional use  Visit Diagnosis: Cervicalgia  Abnormal posture  Muscle weakness (generalized)     Problem List Patient Active Problem List   Diagnosis Date Noted   Mixed hyperlipidemia 06/03/2018   Type 2 diabetes mellitus with diabetic neuropathy, with long-term current use of insulin (HCC) 03/04/2018   Prolapse of female bladder, acquired 03/04/2018      Clarita Crane, PT, DPT 10/13/20 8:33 AM    [facility]  [address] [place], [place], [address] Phone: [phone]   Fax:  [phone]  Name: Brittany Klein MRN: [id number] Date of Birth: 1955/12/15

## 2020-10-17 ENCOUNTER — Ambulatory Visit (INDEPENDENT_AMBULATORY_CARE_PROVIDER_SITE_OTHER): Payer: 59 | Admitting: Psychology

## 2020-10-17 ENCOUNTER — Other Ambulatory Visit: Payer: Self-pay

## 2020-10-17 DIAGNOSIS — F3289 Other specified depressive episodes: Secondary | ICD-10-CM

## 2020-10-30 ENCOUNTER — Encounter: Payer: Self-pay | Admitting: Physical Therapy

## 2020-10-30 ENCOUNTER — Other Ambulatory Visit: Payer: Self-pay

## 2020-10-30 ENCOUNTER — Ambulatory Visit (INDEPENDENT_AMBULATORY_CARE_PROVIDER_SITE_OTHER): Payer: 59 | Admitting: Physical Therapy

## 2020-10-30 DIAGNOSIS — R293 Abnormal posture: Secondary | ICD-10-CM

## 2020-10-30 DIAGNOSIS — M6281 Muscle weakness (generalized): Secondary | ICD-10-CM | POA: Diagnosis not present

## 2020-10-30 DIAGNOSIS — M542 Cervicalgia: Secondary | ICD-10-CM | POA: Diagnosis not present

## 2020-10-30 NOTE — Therapy (Addendum)
[facility] Physical Therapy [address] [place], [place], [address] Phone: [phone]   Fax:  [phone]  Physical Therapy Treatment/Discharge  Patient Details  Name: Brittany Klein MRN: [id number] Date of Birth: 10-28-1955 Referring Provider (PT): August Saucer Corrie Mckusick, MD   Encounter Date: 10/30/2020   PT End of Session - 10/30/20 0959     Visit Number 3    Number of Visits 3    Date for PT Re-Evaluation 11/09/20    Authorization Type [employer]    PT Start Time 0930    PT Stop Time 1015    PT Time Calculation (min) 45 min    Activity Tolerance Patient tolerated treatment well    Behavior During Therapy [facility] for tasks assessed/performed             Past Medical History:  Diagnosis Date   Anemia    Arthritis    "knees, hands" (08/04/2012)   Chronic lower back pain    "all the time" (08/04/2012)   Dysrhythmia    Tachycardia, comes and goes. Increases with activity   History of blood transfusion 2004   "related to knee replacement" (08/04/2012)   Hyperlipemia    Type II diabetes mellitus (HCC)     Past Surgical History:  Procedure Laterality Date   CARPAL TUNNEL RELEASE Left 1990's   HERNIA REPAIR  2000   umbilical   JOINT REPLACEMENT     TOTAL KNEE ARTHROPLASTY Left 10/2002   TOTAL KNEE REVISION Left 08/04/2012   TOTAL KNEE REVISION Left 08/04/2012   Procedure: LEFT TOTAL KNEE REVISION;  Surgeon: Cammy Copa, MD;  Location: [facility] OR;  Service: Orthopedics;  Laterality: Left;   TUBAL LIGATION  1980    There were no vitals filed for this visit.   Subjective Assessment - 10/30/20 0935     Subjective pain is improved, c/o of more stiffness.  has a dull ache in her shoulders worse at night    Pertinent History DM    Limitations Lifting    Diagnostic tests MRIs of the cervical spine shows bilateral foraminal stenosis at C4-5 as well as moderate right foraminal stenosis at C6-7.    Patient Stated Goals improve pain    Currently in  Pain? Yes    Pain Score 3     Pain Location Neck    Pain Orientation Right;Left;Posterior;Upper    Pain Descriptors / Indicators Aching;Cramping    Pain Type Chronic pain    Pain Onset More than a month ago    Pain Frequency Intermittent    Aggravating Factors  sleep (worse at night), turning head and reaching overhead    Pain Relieving Factors tramadol, heat                [facility] PT Assessment - 10/30/20 1021       Assessment   Medical Diagnosis M54.2 (ICD-10-CM) - Neck pain    Referring Provider (PT) August Saucer Corrie Mckusick, MD      Observation/Other Assessments   Focus on Therapeutic Outcomes (FOTO)  48 (predicted 51)                           [facility] Adult PT Treatment/Exercise - 10/30/20 0001       Exercises   Exercises Neck      Neck Exercises: Machines for Strengthening   Nustep L5 x 8 min      Neck Exercises: Theraband   Rows 20 reps;Green  Rows Limitations standing, 5 sec hold    Shoulder External Rotation 20 reps;Green    Shoulder External Rotation Limitations standing, 5 sec hold      Traction   Type of Traction Cervical    Min (lbs) 10    Max (lbs) 15    Hold Time 60    Rest Time 20    Time 10                       PT Short Term Goals - 10/13/20 0830       PT SHORT TERM GOAL #1   Title independent with initial HEP    Time 2    Period Weeks    Status Achieved    Target Date 10/12/20               PT Long Term Goals - 10/30/20 0959       PT LONG TERM GOAL #1   Title Independent with final HEP    Baseline 10/10: met to date    Time 6    Period Weeks    Status On-going    Target Date 11/09/20      PT LONG TERM GOAL #2   Title FOTO score improved to 51 for improved function    Time 6    Period Weeks    Status On-going      PT LONG TERM GOAL #3   Title perform cervical AROM without increase in pain for improved mobility    Time 6    Period Weeks    Status On-going      PT LONG TERM GOAL #4    Title Report pain < 4/10 for improved function    Baseline 10/10: 3-4/10    Time 6    Period Weeks    Status On-going      PT LONG TERM GOAL #5   Title Demonstrate at least 4/5 strength in bil UEs for improved function    Time 6    Period Weeks    Status On-going                   Plan - 10/30/20 1528     Clinical Impression Statement Pt is demonstrating steady progress with PT at this time with improvement in FOTO score noted today.  Overall decreased pain reported and improved function overall.  All goals ongoing at this time.  She is planning to go to [place] for up to 1 month, so pt will call if she needs to schedule additional appts.    Personal Factors and Comorbidities Comorbidity 1    Comorbidities DM    Examination-Activity Limitations Reach Overhead;Sleep;Carry;Lift    Examination-Participation Restrictions Church;Volunteer;Cleaning;Meal Prep;Community Activity    Stability/Clinical Decision Making Evolving/Moderate complexity    Rehab Potential Fair    PT Frequency Biweekly   pt requesting biweekly   PT Duration 6 weeks    PT Treatment/Interventions ADLs/Self Care Home Management;Cryotherapy;Electrical Stimulation;Moist Heat;Traction;Ultrasound;Therapeutic exercise;Therapeutic activities;Functional mobility training;Neuromuscular re-education;Patient/family education;Manual techniques;Passive range of motion;Dry needling;Taping    PT Next Visit Plan assess response to traction, continue with postural exercises, pt going to [place] and will call if she needs to return    PT Home Exercise Plan Access Code: [email or web address]    Consulted and Agree with Plan of Care Patient             Patient will benefit from skilled therapeutic intervention in order to improve the following deficits and  impairments:  Increased fascial restricitons, Increased muscle spasms, Pain, Postural dysfunction, Decreased range of motion, Decreased strength, Impaired UE functional use  Visit  Diagnosis: Cervicalgia  Abnormal posture  Muscle weakness (generalized)     Problem List Patient Active Problem List   Diagnosis Date Noted   Mixed hyperlipidemia 06/03/2018   Type 2 diabetes mellitus with diabetic neuropathy, with long-term current use of insulin ([place]) 03/04/2018   Prolapse of female bladder, acquired 03/04/2018      Laureen Abrahams, PT, DPT 10/30/20 3:31 PM   PHYSICAL THERAPY DISCHARGE SUMMARY  Visits from Start of Care: 3  Current functional level related to goals / functional outcomes: See note   Remaining deficits: See note   Education / Equipment: HEP   Patient agrees to discharge. Patient goals were partially met. Patient is being discharged due to not returning since the last visit. Scot Jun, PT, DPT, OCS, ATC 01/02/21  9:55 AM      [facility] Physical Therapy [address] [place], [place], [address] Phone: [phone]   Fax:  [phone]  Name: Brittany Klein MRN: [id number] Date of Birth: Aug 27, 1955

## 2020-11-14 ENCOUNTER — Other Ambulatory Visit: Payer: Self-pay

## 2020-11-14 ENCOUNTER — Ambulatory Visit (INDEPENDENT_AMBULATORY_CARE_PROVIDER_SITE_OTHER): Payer: 59 | Admitting: Psychology

## 2020-11-14 DIAGNOSIS — F3289 Other specified depressive episodes: Secondary | ICD-10-CM | POA: Diagnosis not present

## 2020-11-22 ENCOUNTER — Telehealth: Payer: Self-pay | Admitting: Physical Medicine and Rehabilitation

## 2020-11-22 NOTE — Telephone Encounter (Signed)
Patient called. She would like a call back to schedule with Dr. Alvester Morin.

## 2020-11-22 NOTE — Telephone Encounter (Signed)
Scheduled for OV. 

## 2020-11-24 ENCOUNTER — Ambulatory Visit (INDEPENDENT_AMBULATORY_CARE_PROVIDER_SITE_OTHER): Payer: 59 | Admitting: Physical Medicine and Rehabilitation

## 2020-11-24 ENCOUNTER — Other Ambulatory Visit: Payer: Self-pay

## 2020-11-24 ENCOUNTER — Encounter: Payer: Self-pay | Admitting: Physical Medicine and Rehabilitation

## 2020-11-24 VITALS — BP 152/83 | HR 82

## 2020-11-24 DIAGNOSIS — M542 Cervicalgia: Secondary | ICD-10-CM | POA: Diagnosis not present

## 2020-11-24 DIAGNOSIS — M5010 Cervical disc disorder with radiculopathy, unspecified cervical region: Secondary | ICD-10-CM

## 2020-11-24 DIAGNOSIS — M5412 Radiculopathy, cervical region: Secondary | ICD-10-CM | POA: Diagnosis not present

## 2020-11-24 NOTE — Progress Notes (Signed)
Pt state neck pain that travels to both shoulders and tingling down her right arm.Pt state the pain keeps her up a night. Pt state turning her neck and reaching up for items makes the pain worse. Pt state she takes pain meds and uses icy hot to help ease her pain.  Numeric Pain Rating Scale and Functional Assessment Average Pain 9 Pain Right Now 6 My pain is intermittent, dull, tingling, and aching Pain is worse with: sitting and some activites Pain improves with: medication   In the last MONTH (on 0-10 scale) has pain interfered with the following?  1. General activity like being  able to carry out your everyday physical activities such as walking, climbing stairs, carrying groceries, or moving a chair?  Rating(7)  2. Relation with others like being able to carry out your usual social activities and roles such as  activities at home, at work and in your community. Rating(8)  3. Enjoyment of life such that you have  been bothered by emotional problems such as feeling anxious, depressed or irritable?  Rating(9)

## 2020-11-24 NOTE — Progress Notes (Signed)
Brittany Klein - 65 y.o. female MRN [id number]  Date of birth: 07-07-1955  Office Visit Note: Visit Date: 11/24/2020 PCP: Nathaneil Canary, PA-C Referred by: Nathaneil Canary, PA-C  Subjective: Chief Complaint  Patient presents with   Neck - Pain   Right Shoulder - Pain   Left Shoulder - Pain   Right Arm - Pain, Tingling   HPI: Brittany Klein is a 65 y.o. female who comes in today per the request of Dr. Dorene Grebe for evaluation of chronic, worsening and severe bilateral neck pain radiating to bilateral arms, right greater than left. Patient reports pain has been ongoing for several months. Patient reports pain is exacerbated by movement, activity and when reaching for objects. She describes pain as constant, dull and aching sensation, currently rates as 8 out of 10. Patient states the pain to right arm is most severe and some days she feels like her arm is asleep.  Patient reports some relief of pain with home exercise program, heat and use of over-the-counter medications as needed.  Patient recently attended formal physical therapy with our in-house team and states that this did help to alleviate her pain, however relief was short-lived.  Patient's recent cervical MRI exhibits moderate right foraminal stenosis due to disc bulge/protrusion at C6-C7.  No high-grade spinal canal stenosis noted.  Patient states she is a very active person and has noticed that she has increased difficulty performing daily tasks due to severe pain.  Patient states she has noticed a decrease in her functionality and is becoming more sedentary due to increasing pain.  Patient was previously seen by Dr. Dorene Grebe and did have a referral placed for cervical epidural injection, however this was denied by her insurance.  Patient denies focal weakness, numbness and tingling.  Patient denies recent trauma or falls.  Review of Systems  Musculoskeletal:  Positive for neck pain.  Neurological:  Positive for tingling. Negative for  focal weakness and weakness.  All other systems reviewed and are negative. Otherwise per HPI.  Assessment & Plan: Visit Diagnoses:    ICD-10-CM   1. Cervicalgia  M54.2 Ambulatory referral to Physical Medicine Rehab    2. Radiculopathy, cervical region  M54.12     3. Cervical disc disorder with radiculopathy, unspecified cervical region  M50.10 Ambulatory referral to Physical Medicine Rehab       Plan: Findings:  Chronic, worsening and severe bilateral neck pain rating to both arms, right greater than left.  Patient continues to have excruciating and disabling pain despite good conservative therapies such as home exercise program, formal physical therapy, heat and use of medications as needed.  Patient's clinical presentation and exam are consistent with classic C6 nerve pattern.  Dysesthesias are noted to the C6 dermatome bilaterally upon exam.  We feel the next step is to perform a diagnostic and hopefully therapeutic right C7-T1 interlaminar epidural steroid injection under fluoroscopic guidance.  Patient encouraged to remain active and continue physician directed home exercise regimen as tolerated.  No red flag symptoms noted upon exam today.  Patient has attended formal physical therapy and continues with directed home exercise program. Current medication management is not beneficial in increasing her functional status.  Please note that procedures are done as part of a comprehensive orthopedic and pain management program with access to in-house orthopedics, spine surgery and physical therapy as well as access to [facility] biopsychosocial counseling if needed.     Meds & Orders: No orders of  the defined types were placed in this encounter.   Orders Placed This Encounter  Procedures   Ambulatory referral to Physical Medicine Rehab    Follow-up: Return in about 1 week (around 12/01/2020) for Right C7-T1 interlaminar epidural steroid injection.   Procedures: No procedures  performed      Clinical History: MRI CERVICAL SPINE WITHOUT CONTRAST   TECHNIQUE: Multiplanar, multisequence MR imaging of the cervical spine was performed. No intravenous contrast was administered.   COMPARISON:  None.   FINDINGS: Alignment: Physiologic   Vertebrae: No fracture, evidence of discitis, or bone lesion.   Cord: Normal signal and morphology.   Posterior Fossa, vertebral arteries, paraspinal tissues: Marked cerebellar atrophy   Disc levels:   C2-3: Unremarkable.   C3-4: Unremarkable.   C4-5: Disc narrowing and bulging with uncovertebral spurring. Biforaminal impingement. Bulging disc effaces ventral CSF without cord compression   C5-6: Ventral spondylitic spurring   C6-7: Disc narrowing and bulging with right foraminal protrusion suggested on axial slices. Moderate right foraminal stenosis.   C7-T1:Unremarkable.   Other: Generalized mild motion artifact.   IMPRESSION: 1. C4-5 bilateral foraminal impingement due to disc degeneration 2. C6-7 moderate right foraminal stenosis due to disc bulge/protrusion. 3. Marked cerebellar atrophy.     Electronically Signed   By: Marnee Spring M.D.   On: 08/18/2020 16:44   She reports that she quit smoking about 22 years ago. Her smoking use included cigarettes. She has a 23.00 pack-year smoking history. She has never used smokeless tobacco. No results for input(s): HGBA1C, LABURIC in the last 8760 hours.  Objective:  VS:  HT:    WT:   BMI:     BP:(!) 152/83  HR:82bpm  TEMP: ( )  RESP:  Physical Exam Vitals and nursing note reviewed.  HENT:     Head: Normocephalic and atraumatic.     Right Ear: External ear normal.     Left Ear: External ear normal.     Nose: Nose normal.     Mouth/Throat:     Mouth: Mucous membranes are moist.  Eyes:     Extraocular Movements: Extraocular movements intact.  Cardiovascular:     Rate and Rhythm: Normal rate.     Pulses: Normal pulses.  Pulmonary:     Effort:  Pulmonary effort is normal.  Abdominal:     General: Abdomen is flat. There is no distension.  Musculoskeletal:        General: Tenderness present.     Cervical back: Tenderness present.     Comments: Discomfort noted with flexion, extension and side-to-side rotation. Good strength noted to bilateral upper extremities. Sensation intact bilaterally. Dysesthesias noted to C6 dermatome bilaterally. Negative Hoffman's sign.  Skin:    General: Skin is warm and dry.     Capillary Refill: Capillary refill takes less than 2 seconds.  Neurological:     General: No focal deficit present.     Mental Status: She is alert.     Sensory: Sensory deficit present.     Motor: No weakness.     Gait: Gait normal.  Psychiatric:        Mood and Affect: Mood normal.    Ortho Exam  Imaging: No results found.  Past Medical/Family/Surgical/Social History: Medications & Allergies reviewed per EMR, new medications updated. Patient Active Problem List   Diagnosis Date Noted   Mixed hyperlipidemia 06/03/2018   Type 2 diabetes mellitus with diabetic neuropathy, with long-term current use of insulin (HCC) 03/04/2018   Prolapse of female  bladder, acquired 03/04/2018   Past Medical History:  Diagnosis Date   Anemia    Arthritis    "knees, hands" (08/04/2012)   Chronic lower back pain    "all the time" (08/04/2012)   Dysrhythmia    Tachycardia, comes and goes. Increases with activity   History of blood transfusion 2004   "related to knee replacement" (08/04/2012)   Hyperlipemia    Type II diabetes mellitus (HCC)    Family History  Problem Relation Age of Onset   Diabetes Mother    Cancer Mother    Diabetes Father    Cancer Sister    Heart disease Sister    Diabetes Sister    Past Surgical History:  Procedure Laterality Date   CARPAL TUNNEL RELEASE Left 1990's   HERNIA REPAIR  2000   umbilical   JOINT REPLACEMENT     TOTAL KNEE ARTHROPLASTY Left 10/2002   TOTAL KNEE REVISION Left 08/04/2012    TOTAL KNEE REVISION Left 08/04/2012   Procedure: LEFT TOTAL KNEE REVISION;  Surgeon: Cammy Copa, MD;  Location: [facility] OR;  Service: Orthopedics;  Laterality: Left;   TUBAL LIGATION  1980   Social History   Occupational History   Not on file  Tobacco Use   Smoking status: Former    Packs/day: 1.00    Years: 23.00    Pack years: 23.00    Types: Cigarettes    Quit date: 12/01/1998    Years since quitting: 22.0   Smokeless tobacco: Never  Substance and Sexual Activity   Alcohol use: Yes    Comment: 08/05/2012 "don't drink anymore; stopped ~ 11/1998; never had problem w/it"   Drug use: No   Sexual activity: Never

## 2020-12-05 ENCOUNTER — Telehealth: Payer: Self-pay

## 2020-12-05 ENCOUNTER — Telehealth: Payer: Self-pay | Admitting: Physical Medicine and Rehabilitation

## 2020-12-05 NOTE — Telephone Encounter (Signed)
Pt called to set an appt. Pt phone number is [phone].

## 2020-12-05 NOTE — Telephone Encounter (Signed)
Pt called and would like a call back regarding an apt with Dr. Alvester Morin

## 2020-12-06 NOTE — Telephone Encounter (Signed)
Called patient to advise that this is pending with insurance. Patient states that she received approval letter. Please advise.

## 2020-12-07 ENCOUNTER — Telehealth: Payer: Self-pay

## 2020-12-07 NOTE — Telephone Encounter (Signed)
Pt returning phone call to set up an apt

## 2021-01-01 ENCOUNTER — Ambulatory Visit (INDEPENDENT_AMBULATORY_CARE_PROVIDER_SITE_OTHER): Payer: 59 | Admitting: Physical Medicine and Rehabilitation

## 2021-01-01 ENCOUNTER — Encounter: Payer: Self-pay | Admitting: Physical Medicine and Rehabilitation

## 2021-01-01 ENCOUNTER — Other Ambulatory Visit: Payer: Self-pay

## 2021-01-01 ENCOUNTER — Ambulatory Visit: Payer: Self-pay

## 2021-01-01 VITALS — BP 127/80 | HR 79

## 2021-01-01 DIAGNOSIS — M5412 Radiculopathy, cervical region: Secondary | ICD-10-CM

## 2021-01-01 MED ORDER — METHYLPREDNISOLONE ACETATE 80 MG/ML IJ SUSP
80.0000 mg | Freq: Once | INTRAMUSCULAR | Status: AC
Start: 2021-01-01 — End: 2021-01-01
  Administered 2021-01-01: 11:00:00 80 mg

## 2021-01-01 NOTE — Progress Notes (Signed)
Pt state neck pain that travels to her right shoulder and arm. Pt state any movement with her right arm makes the pain worse. Pt state she takes pain meds and uses heat to help ease her pain.  Numeric Pain Rating Scale and Functional Assessment Average Pain 8   In the last MONTH (on 0-10 scale) has pain interfered with the following?  1. General activity like being  able to carry out your everyday physical activities such as walking, climbing stairs, carrying groceries, or moving a chair?  Rating(10)   +Driver, -BT, -Dye Allergies.

## 2021-01-01 NOTE — Patient Instructions (Signed)

## 2021-01-09 NOTE — Procedures (Signed)
Cervical Epidural Steroid Injection - Interlaminar Approach with Fluoroscopic Guidance  [name]      Date of Birth: 1955-10-26 MRN: [id number] PCP: Nathaneil Canary, PA-C      Visit Date: 01/01/2021   Universal Protocol:    Date/Time: 12/20/226:03 AM  Consent Given By: the patient  Position: PRONE  Additional Comments: Vital signs were monitored before and after the procedure. Patient was prepped and draped in the usual sterile fashion. The correct patient, procedure, and site was verified.   Injection Procedure Details:   Procedure diagnoses: Cervical radiculopathy [M54.12]    Meds Administered:  Meds ordered this encounter  Medications   methylPREDNISolone acetate (DEPO-MEDROL) injection 80 mg     Laterality: Right  Location/Site: C7-T1  Needle: 3.5 in., 20 ga. Tuohy  Needle Placement: Paramedian epidural space  Findings:  -Comments: Excellent flow of contrast into the epidural space.  Procedure Details: Using a paramedian approach from the side mentioned above, the region overlying the inferior lamina was localized under fluoroscopic visualization and the soft tissues overlying this structure were infiltrated with 4 ml. of 1% Lidocaine without Epinephrine. A # 20 gauge, Tuohy needle was inserted into the epidural space using a paramedian approach.  The epidural space was localized using loss of resistance along with contralateral oblique bi-planar fluoroscopic views.  After negative aspirate for air, blood, and CSF, a 2 ml. volume of Isovue-250 was injected into the epidural space and the flow of contrast was observed. Radiographs were obtained for documentation purposes.   The injectate was administered into the level noted above.  Additional Comments:  No complications occurred Dressing: 2 x 2 sterile gauze and Band-Aid    Post-procedure details: Patient was observed during the procedure. Post-procedure instructions were reviewed.  Patient  left the clinic in stable condition.

## 2021-01-09 NOTE — Progress Notes (Signed)
Brittany Klein - 65 y.o. female MRN [id number]  Date of birth: 09/26/1955  Office Visit Note: Visit Date: 01/01/2021 PCP: Nathaneil Canary, PA-C Referred by: Nathaneil Canary, PA-C  Subjective: Chief Complaint  Patient presents with   Neck - Pain   Right Shoulder - Pain   Right Arm - Pain   HPI:  Brittany Klein is a 65 y.o. female who comes in today at the request of Ellin Goodie, FNP for planned Right C7-T1 Cervical Interlaminar epidural steroid injection with fluoroscopic guidance.  The patient has failed conservative care including home exercise, medications, time and activity modification.  This injection will be diagnostic and hopefully therapeutic.  Please see requesting physician notes for further details and justification. MRI reviewed with images and spine model.  MRI reviewed in the note below.   ROS Otherwise per HPI.  Assessment & Plan: Visit Diagnoses:    ICD-10-CM   1. Cervical radiculopathy  M54.12 XR C-ARM NO REPORT    Epidural Steroid injection    methylPREDNISolone acetate (DEPO-MEDROL) injection 80 mg      Plan: No additional findings.   Meds & Orders:  Meds ordered this encounter  Medications   methylPREDNISolone acetate (DEPO-MEDROL) injection 80 mg    Orders Placed This Encounter  Procedures   XR C-ARM NO REPORT   Epidural Steroid injection    Follow-up: Return if symptoms worsen or fail to improve.   Procedures: No procedures performed  Cervical Epidural Steroid Injection - Interlaminar Approach with Fluoroscopic Guidance  [name]      Date of Birth: [birth year] MRN: [id number] PCP: Nathaneil Canary, PA-C      Visit Date: 01/01/2021   Universal Protocol:    Date/Time: 12/20/226:03 AM  Consent Given By: the patient  Position: PRONE  Additional Comments: Vital signs were monitored before and after the procedure. Patient was prepped and draped in the usual sterile fashion. The correct patient, procedure, and site was  verified.   Injection Procedure Details:   Procedure diagnoses: Cervical radiculopathy [M54.12]    Meds Administered:  Meds ordered this encounter  Medications   methylPREDNISolone acetate (DEPO-MEDROL) injection 80 mg     Laterality: Right  Location/Site: C7-T1  Needle: 3.5 in., 20 ga. Tuohy  Needle Placement: Paramedian epidural space  Findings:  -Comments: Excellent flow of contrast into the epidural space.  Procedure Details: Using a paramedian approach from the side mentioned above, the region overlying the inferior lamina was localized under fluoroscopic visualization and the soft tissues overlying this structure were infiltrated with 4 ml. of 1% Lidocaine without Epinephrine. A # 20 gauge, Tuohy needle was inserted into the epidural space using a paramedian approach.  The epidural space was localized using loss of resistance along with contralateral oblique bi-planar fluoroscopic views.  After negative aspirate for air, blood, and CSF, a 2 ml. volume of Isovue-250 was injected into the epidural space and the flow of contrast was observed. Radiographs were obtained for documentation purposes.   The injectate was administered into the level noted above.  Additional Comments:  No complications occurred Dressing: 2 x 2 sterile gauze and Band-Aid    Post-procedure details: Patient was observed during the procedure. Post-procedure instructions were reviewed.  Patient left the clinic in stable condition.    Clinical History: MRI CERVICAL SPINE WITHOUT CONTRAST   TECHNIQUE: Multiplanar, multisequence MR imaging of the cervical spine was performed. No intravenous contrast was administered.   COMPARISON:  None.   FINDINGS: Alignment:  Physiologic   Vertebrae: No fracture, evidence of discitis, or bone lesion.   Cord: Normal signal and morphology.   Posterior Fossa, vertebral arteries, paraspinal tissues: Marked cerebellar atrophy   Disc levels:   C2-3:  Unremarkable.   C3-4: Unremarkable.   C4-5: Disc narrowing and bulging with uncovertebral spurring. Biforaminal impingement. Bulging disc effaces ventral CSF without cord compression   C5-6: Ventral spondylitic spurring   C6-7: Disc narrowing and bulging with right foraminal protrusion suggested on axial slices. Moderate right foraminal stenosis.   C7-T1:Unremarkable.   Other: Generalized mild motion artifact.   IMPRESSION: 1. C4-5 bilateral foraminal impingement due to disc degeneration 2. C6-7 moderate right foraminal stenosis due to disc bulge/protrusion. 3. Marked cerebellar atrophy.     Electronically Signed   By: Marnee Spring M.D.   On: 08/18/2020 16:44     Objective:  VS:  HT:     WT:    BMI:      BP:127/80   HR:79bpm   TEMP: ( )   RESP:  Physical Exam Vitals and nursing note reviewed.  Constitutional:      General: She is not in acute distress.    Appearance: Normal appearance. She is not ill-appearing.  HENT:     Head: Normocephalic and atraumatic.     Right Ear: External ear normal.     Left Ear: External ear normal.  Eyes:     Extraocular Movements: Extraocular movements intact.  Cardiovascular:     Rate and Rhythm: Normal rate.     Pulses: Normal pulses.  Musculoskeletal:     Cervical back: Tenderness present. No rigidity.     Right lower leg: No edema.     Left lower leg: No edema.     Comments: Patient has good strength in the upper extremities including 5 out of 5 strength in wrist extension long finger flexion and APB.  There is no atrophy of the hands intrinsically.  There is a negative Hoffmann's test.   Lymphadenopathy:     Cervical: No cervical adenopathy.  Skin:    Findings: No erythema, lesion or rash.  Neurological:     General: No focal deficit present.     Mental Status: She is alert and oriented to person, place, and time.     Sensory: No sensory deficit.     Motor: No weakness or abnormal muscle tone.     Coordination:  Coordination normal.  Psychiatric:        Mood and Affect: Mood normal.        Behavior: Behavior normal.     Imaging: No results found.

## 2021-01-16 ENCOUNTER — Telehealth: Payer: Self-pay | Admitting: Dermatology

## 2021-01-16 NOTE — Telephone Encounter (Signed)
Doesn't want to wait until June. She'll contact [facility]

## 2021-01-16 NOTE — Telephone Encounter (Signed)
Notes documented and referral closed. ?

## 2021-01-17 ENCOUNTER — Telehealth: Payer: Self-pay | Admitting: Physician Assistant

## 2021-01-17 NOTE — Telephone Encounter (Signed)
Patient is calling for a referral appointment from Filomena Jungling, NP.  Patient is scheduled for 07/31/2021 at 10:00 with [facility], PA-C.

## 2021-01-18 NOTE — Telephone Encounter (Signed)
Referral opened back up, notes documented and referral attached to appointment.

## 2021-01-30 ENCOUNTER — Ambulatory Visit: Payer: 59 | Admitting: Psychology

## 2021-02-12 ENCOUNTER — Ambulatory Visit: Payer: 59

## 2021-02-12 ENCOUNTER — Encounter (HOSPITAL_COMMUNITY): Payer: Self-pay

## 2021-02-12 ENCOUNTER — Other Ambulatory Visit: Payer: Self-pay

## 2021-02-12 ENCOUNTER — Ambulatory Visit (INDEPENDENT_AMBULATORY_CARE_PROVIDER_SITE_OTHER): Payer: 59 | Admitting: Podiatry

## 2021-02-12 DIAGNOSIS — M795 Residual foreign body in soft tissue: Secondary | ICD-10-CM | POA: Diagnosis not present

## 2021-02-12 DIAGNOSIS — L84 Corns and callosities: Secondary | ICD-10-CM

## 2021-02-12 DIAGNOSIS — I739 Peripheral vascular disease, unspecified: Secondary | ICD-10-CM | POA: Diagnosis not present

## 2021-02-12 DIAGNOSIS — M79671 Pain in right foot: Secondary | ICD-10-CM

## 2021-02-12 NOTE — Patient Instructions (Signed)

## 2021-02-15 NOTE — Progress Notes (Signed)
Subjective:   Patient ID: Brittany Klein, female   DOB: 66 y.o.   MRN: [id number]   HPI 66 year old female presents the office today for concerns of a blister on her right foot which started Wednesday night.  She says it started in the bottom of the foot and she feel that she stepped on something.  She states that it looks like there is a blood trapped under the skin.  Denies any drainage or pus.  No swelling.  Denies stepping any foreign objects or any injury.  Last A1c in the chart was 8.8 on March 29, 2019   Review of Systems  All other systems reviewed and are negative.  Past Medical History:  Diagnosis Date   Anemia    Arthritis    "knees, hands" (08/04/2012)   Chronic lower back pain    "all the time" (08/04/2012)   Dysrhythmia    Tachycardia, comes and goes. Increases with activity   History of blood transfusion 2004   "related to knee replacement" (08/04/2012)   Hyperlipemia    Type II diabetes mellitus (HCC)     Past Surgical History:  Procedure Laterality Date   CARPAL TUNNEL RELEASE Left 1990's   HERNIA REPAIR  2000   umbilical   JOINT REPLACEMENT     TOTAL KNEE ARTHROPLASTY Left 10/2002   TOTAL KNEE REVISION Left 08/04/2012   TOTAL KNEE REVISION Left 08/04/2012   Procedure: LEFT TOTAL KNEE REVISION;  Surgeon: Cammy Copa, MD;  Location: [facility];  Service: Orthopedics;  Laterality: Left;   TUBAL LIGATION  1980     Current Outpatient Medications:    aspirin EC 81 MG tablet, Take 81 mg by mouth daily., Disp: , Rfl:    glimepiride (AMARYL) 4 MG tablet, TAKE 1 TABLET BY MOUTH IN THE MORNING AND TAKE 1/2 TABLET BY MOUTH IN THE EVENING. NEED TO MAKE AN APPOINTMENT, Disp: 135 tablet, Rfl: 1   metFORMIN (GLUMETZA) 1000 MG (MOD) 24 hr tablet, Take 1 tablet (1,000 mg total) by mouth 2 (two) times daily with a meal., Disp: 180 tablet, Rfl: 1   methocarbamol (ROBAXIN) 500 MG tablet, Take 1 tablet (500 mg total) by mouth every 8 (eight) hours as needed for muscle spasms.,  Disp: 30 tablet, Rfl: 0   simvastatin (ZOCOR) 40 MG tablet, TAKE 1 TABLET(40 MG) BY MOUTH DAILY, Disp: 90 tablet, Rfl: 1   traMADol (ULTRAM) 50 MG tablet, Take 1 tablet (50 mg total) by mouth every 8 (eight) hours as needed., Disp: 30 tablet, Rfl: 0  Allergies  Allergen Reactions   Morphine And Related Itching          Objective:  Physical Exam  General: AAO x3, NAD  Dermatological: On the plantar aspect of the right foot near the heel is 1 small area of dried blood.  Upon debridement there is no open sore or evidence of puncture wound.  There is no surrounding edema, erythema or signs of infection.  This could have possibly been a small skin tear that is healed.  I do not see any evidence of puncture wound or foreign body.  Vascular: DP pulses 2/4, PT pulse 1/4 bilateral with immedate capillary fill time. Pedal hair growth present. No varicosities and no lower extremity edema present bilateral. There is no pain with calf compression, swelling, warmth, erythema.   Neruologic: Grossly intact via light touch bilateral.   Musculoskeletal: No tenderness to the area today.  Muscular strength 5/5 in all groups tested bilateral.  Gait:  Unassisted, Nonantalgic.       Assessment:   Preulcerative lesion right foot     Plan:  -Treatment options discussed including all alternatives, risks, and complications -Etiology of symptoms were discussed -X-rays were obtained and reviewed with the patient.  No subacute fracture, stress fracture, foreign body or osteomyelitis. -Sharp debrided some of the tissue today without any complications or bleeding.  Recommend a small amount of antibiotic ointment daily.  Offloading pads were dispensed.  Discussed daily foot inspection and monitoring signs or symptoms of infection or any worsening insulin immediately should any occur. -ABI ordered  Vivi Barrack DPM

## 2021-02-16 ENCOUNTER — Other Ambulatory Visit: Payer: Self-pay

## 2021-02-16 ENCOUNTER — Ambulatory Visit (HOSPITAL_COMMUNITY)
Admission: RE | Admit: 2021-02-16 | Discharge: 2021-02-16 | Disposition: A | Discharge status: Home / Self Care | Payer: 59 | Source: Ambulatory Visit | Attending: Podiatry | Admitting: Podiatry

## 2021-02-16 DIAGNOSIS — I739 Peripheral vascular disease, unspecified: Secondary | ICD-10-CM | POA: Insufficient documentation

## 2021-03-19 ENCOUNTER — Ambulatory Visit (INDEPENDENT_AMBULATORY_CARE_PROVIDER_SITE_OTHER): Payer: 59 | Admitting: Nurse Practitioner

## 2021-03-19 ENCOUNTER — Other Ambulatory Visit: Payer: Self-pay

## 2021-03-19 ENCOUNTER — Encounter: Payer: Self-pay | Admitting: Nurse Practitioner

## 2021-03-19 VITALS — BP 122/76 | HR 85 | Temp 97.3°F | Ht 65.0 in | Wt 198.0 lb

## 2021-03-19 DIAGNOSIS — E782 Mixed hyperlipidemia: Secondary | ICD-10-CM | POA: Diagnosis not present

## 2021-03-19 DIAGNOSIS — D649 Anemia, unspecified: Secondary | ICD-10-CM

## 2021-03-19 DIAGNOSIS — E1169 Type 2 diabetes mellitus with other specified complication: Secondary | ICD-10-CM | POA: Diagnosis not present

## 2021-03-19 DIAGNOSIS — Z794 Long term (current) use of insulin: Secondary | ICD-10-CM

## 2021-03-19 DIAGNOSIS — N8110 Cystocele, unspecified: Secondary | ICD-10-CM

## 2021-03-19 DIAGNOSIS — M159 Polyosteoarthritis, unspecified: Secondary | ICD-10-CM | POA: Diagnosis not present

## 2021-03-19 DIAGNOSIS — F3341 Major depressive disorder, recurrent, in partial remission: Secondary | ICD-10-CM | POA: Insufficient documentation

## 2021-03-19 NOTE — Progress Notes (Signed)
Careteam: Patient Care Team: Elinor Parkinson as PCP - General (Physician Assistant) Marylynn Pearson, MD as Consulting Physician (Obstetrics and Gynecology)  PLACE OF SERVICE:  [place] Directive information Does Patient Have a Medical Advance Directive?: No, Would patient like information on creating a medical advance directive?: Yes (MAU/Ambulatory/Procedural Areas - Information given)  Allergies  Allergen Reactions   Morphine And Related Itching    Chief Complaint  Patient presents with   Establish Care    New patient to establish care. Discuss scalp condition and boils under armpit. Patient has a pending appointment with [facility] and questions if this is whom we would recommend.      HPI: Patient is a 66 y.o. female to establish care.  Her insurance coverage changed and her previous provider no longer took the insurance.  Last seen in December   DM- taking glimepiride 4 mg daily, metformin 1000 mg by mouth twice, on ozempic 1 mg weekly This morning blood sugar 124, generally in the 70s. Last A1c 6. "Something"   Reports bad leg cramps- taking methocarbamol PRN. She had ultrasound to evaluate.   Hyperlipidemia- on zocor 40 mg daily, recently as started dietary modifications.   Hx of left knee replacement, doing well with that.  Now reports more hip problems  Anemia- eats diet high in iron.   Chronic low back pain and neck pain- has had epidural shots. Takes medication by Dr Marlou Sa (not on current list)   Has hx of prolapse bladder - seeing Dr Orvan Seen. Taking a medication to help with bladder control   She reports her doctors say she is depressed, she is has anger, working with therapist.   Review of Systems:  Review of Systems  Constitutional:  Negative for chills, fever and weight loss.  HENT:  Negative for tinnitus.   Respiratory:  Negative for cough, sputum production and shortness of breath.   Cardiovascular:  Negative for chest  pain, palpitations and leg swelling.  Gastrointestinal:  Negative for abdominal pain, constipation, diarrhea and heartburn.  Genitourinary:  Negative for dysuria, frequency and urgency.  Musculoskeletal:  Negative for back pain, falls, joint pain and myalgias.  Skin: Negative.   Neurological:  Negative for dizziness and headaches.  Psychiatric/Behavioral:  Negative for depression and memory loss. The patient does not have insomnia.    Past Medical History:  Diagnosis Date   Anemia    Arthritis    "knees, hands" (08/04/2012)   Chronic lower back pain    "all the time" (08/04/2012)   Dysrhythmia    Tachycardia, comes and goes. Increases with activity   History of blood transfusion 01/21/2002   "related to knee replacement" (08/04/2012)   History of blood transfusion    Hyperlipemia    Tachycardia    Type II diabetes mellitus ([place])    Past Surgical History:  Procedure Laterality Date   CARPAL TUNNEL RELEASE Left 1990's   HERNIA REPAIR  7711   umbilical   JOINT REPLACEMENT     TOTAL KNEE ARTHROPLASTY Left 10/2002   TOTAL KNEE REVISION Left 08/04/2012   TOTAL KNEE REVISION Left 08/04/2012   Procedure: LEFT TOTAL KNEE REVISION;  Surgeon: Meredith Pel, MD;  Location: [facility];  Service: Orthopedics;  Laterality: Left;   TUBAL LIGATION  1980   Social History:   reports that she quit smoking about 22 years ago. Her smoking use included cigarettes. She has a 23.00 pack-year smoking history. She has never used smokeless tobacco.  She reports that she does not currently use alcohol. She reports that she does not use drugs.  Family History  Problem Relation Age of Onset   Diabetes Mother    Cancer Mother    Dementia Mother    Diabetes Father    Cancer Sister    Heart disease Sister    Diabetes Sister    Hypertension Sister    Stroke Sister    Diabetes Sister    Cancer Sister    Diabetes Sister    Diabetes Sister    Diabetes Mellitus I Daughter     Medications: Patient's  Medications  New Prescriptions   No medications on file  Previous Medications   ASPIRIN EC 81 MG TABLET    Take 81 mg by mouth daily.   GLIMEPIRIDE (AMARYL) 4 MG TABLET    Take 4 mg by mouth daily with breakfast.   METFORMIN (GLUMETZA) 1000 MG (MOD) 24 HR TABLET    Take 1 tablet (1,000 mg total) by mouth 2 (two) times daily with a meal.   METHOCARBAMOL (ROBAXIN) 500 MG TABLET    Take 1 tablet (500 mg total) by mouth every 8 (eight) hours as needed for muscle spasms.   SEMAGLUTIDE (OZEMPIC, 1 MG/DOSE, [place])    Inject into the skin once a week.   SIMVASTATIN (ZOCOR) 40 MG TABLET    TAKE 1 TABLET(40 MG) BY MOUTH DAILY   TRAMADOL (ULTRAM) 50 MG TABLET    Take 1 tablet (50 mg total) by mouth every 8 (eight) hours as needed.  Modified Medications   No medications on file  Discontinued Medications   GLIMEPIRIDE (AMARYL) 4 MG TABLET    TAKE 1 TABLET BY MOUTH IN THE MORNING AND TAKE 1/2 TABLET BY MOUTH IN THE EVENING. NEED TO MAKE AN APPOINTMENT    Physical Exam:  Vitals:   03/19/21 0838  BP: 122/76  Pulse: 85  Temp: (!) 97.3 F (36.3 C)  TempSrc: Temporal  SpO2: 99%  Weight: 198 lb (89.8 kg)  Height: $Remove'5\' 5"'[id number]$  (1.651 m)   Body mass index is 32.95 kg/m. Wt Readings from Last 3 Encounters:  03/19/21 198 lb (89.8 kg)  06/10/19 207 lb (93.9 kg)  03/29/19 210 lb 12.8 oz (95.6 kg)    Physical Exam Constitutional:      General: She is not in acute distress.    Appearance: She is well-developed. She is not diaphoretic.  HENT:     Head: Normocephalic and atraumatic.     Mouth/Throat:     Pharynx: No oropharyngeal exudate.  Eyes:     Conjunctiva/sclera: Conjunctivae normal.     Pupils: Pupils are equal, round, and reactive to light.  Cardiovascular:     Rate and Rhythm: Normal rate and regular rhythm.     Heart sounds: Normal heart sounds.  Pulmonary:     Effort: Pulmonary effort is normal.     Breath sounds: Normal breath sounds.  Abdominal:     General: Bowel sounds are normal.      Palpations: Abdomen is soft.  Musculoskeletal:     Cervical back: Normal range of motion and neck supple.     Right lower leg: No edema.     Left lower leg: No edema.  Skin:    General: Skin is warm and dry.  Neurological:     Mental Status: She is alert.  Psychiatric:        Mood and Affect: Mood normal.    Labs reviewed: Basic Metabolic Panel: No  results for input(s): NA, K, CL, CO2, GLUCOSE, BUN, CREATININE, CALCIUM, MG, PHOS, TSH in the last 8760 hours. Liver Function Tests: No results for input(s): AST, ALT, ALKPHOS, BILITOT, PROT, ALBUMIN in the last 8760 hours. No results for input(s): LIPASE, AMYLASE in the last 8760 hours. No results for input(s): AMMONIA in the last 8760 hours. CBC: No results for input(s): WBC, NEUTROABS, HGB, HCT, MCV, PLT in the last 8760 hours. Lipid Panel: No results for input(s): CHOL, HDL, LDLCALC, TRIG, CHOLHDL, LDLDIRECT in the last 8760 hours. TSH: No results for input(s): TSH in the last 8760 hours. A1C: Lab Results  Component Value Date   HGBA1C 8.8 (H) 03/29/2019     Assessment/Plan 1. Primary osteoarthritis involving multiple joints -ongoing, s/p knee replacement and doing well with this  2. Anemia, unspecified type -will follow up lab today. - CBC with Differential/Platelet; Future - CBC with Differential/Platelet  3. Type 2 diabetes mellitus with other specified complication, with long-term current use of insulin ([place]) -Encouraged dietary compliance, routine foot care/monitoring and to keep up with diabetic eye exams through ophthalmology. -will continue medication management at this time.  - Hemoglobin A1c; Future - Hemoglobin A1c  4. Mixed hyperlipidemia -dietary modifications with statin - Lipid panel; Future - CMP with eGFR([facility]); Future - CMP with eGFR([facility]) - Lipid panel  5. Recurrent major depressive disorder, in partial remission ([place]) -stable at this time, reports a lot of issues were from work but she has  now retired  57. Prolapse of female bladder, acquired -followed by GYN   Return in about 6 weeks (around 04/30/2021) for CPE. NEEDS TO BRING ALL MEDICATION BOTTLES IN PRIOR TO REFIL. She is in agreement.  Carlos American. [place], [facility] [phone]

## 2021-03-19 NOTE — Patient Instructions (Addendum)
Please sign a record release for Dr Vickey Sages on check out and for Dr Suzie Portela (last year) Please bring medication bottles from all doctors office and supplement so we can make sure we have the correct medication on file.

## 2021-03-20 ENCOUNTER — Telehealth: Payer: Self-pay | Admitting: *Deleted

## 2021-03-20 NOTE — Telephone Encounter (Signed)
Patient brought in her bottles of medications for review.   Needs added to Current medication list: Oxybutynin ER 5mg  one daily.   Nystatin/Triamcinolone Oint 15gm Apply topically to affected area as needed.   Tramdol 50mg  One by mouth every 8 hours as needed. LR:09/20/20  Current Medication list updated.

## 2021-03-20 NOTE — Telephone Encounter (Signed)
Thank you :)

## 2021-03-21 LAB — COMPLETE METABOLIC PANEL WITH GFR
AG Ratio: 1.2 (calc) (ref 1.0–2.5)
ALT: 16 U/L (ref 6–29)
AST: 17 U/L (ref 10–35)
Albumin: 3.8 g/dL (ref 3.6–5.1)
Alkaline phosphatase (APISO): 78 U/L (ref 37–153)
BUN: 16 mg/dL (ref 7–25)
CO2: 29 mmol/L (ref 20–32)
Calcium: 9.8 mg/dL (ref 8.6–10.4)
Chloride: 104 mmol/L (ref 98–110)
Creat: 0.97 mg/dL (ref 0.50–1.05)
Globulin: 3.3 g/dL (calc) (ref 1.9–3.7)
Glucose, Bld: 112 mg/dL — ABNORMAL HIGH (ref 65–99)
Potassium: 5.4 mmol/L — ABNORMAL HIGH (ref 3.5–5.3)
Sodium: 140 mmol/L (ref 135–146)
Total Bilirubin: 0.3 mg/dL (ref 0.2–1.2)
Total Protein: 7.1 g/dL (ref 6.1–8.1)
eGFR: 65 mL/min/{1.73_m2} (ref >=60)

## 2021-03-21 LAB — TEST AUTHORIZATION

## 2021-03-21 LAB — CBC WITH DIFFERENTIAL/PLATELET
Absolute Monocytes: 399 cells/uL (ref 200–950)
Basophils Absolute: 42 cells/uL (ref 0–200)
Basophils Relative: 0.6 %
Eosinophils Absolute: 119 cells/uL (ref 15–500)
Eosinophils Relative: 1.7 %
HCT: 33 % — ABNORMAL LOW (ref 35.0–45.0)
Hemoglobin: 10.1 g/dL — ABNORMAL LOW (ref 11.7–15.5)
Lymphs Abs: 2555 cells/uL (ref 850–3900)
MCH: 25.9 pg — ABNORMAL LOW (ref 27.0–33.0)
MCHC: 30.6 g/dL — ABNORMAL LOW (ref 32.0–36.0)
MCV: 84.6 fL (ref 80.0–100.0)
MPV: 10.1 fL (ref 7.5–12.5)
Monocytes Relative: 5.7 %
Neutro Abs: 3885 cells/uL (ref 1500–7800)
Neutrophils Relative %: 55.5 %
Platelets: 323 10*3/uL (ref 140–400)
RBC: 3.9 10*6/uL (ref 3.80–5.10)
RDW: 13.2 % (ref 11.0–15.0)
Total Lymphocyte: 36.5 %
WBC: 7 10*3/uL (ref 3.8–10.8)

## 2021-03-21 LAB — HEMOGLOBIN A1C
Hgb A1c MFr Bld: 6.4 % of total Hgb — ABNORMAL HIGH (ref <5.7)
Mean Plasma Glucose: 137 mg/dL
eAG (mmol/L): 7.6 mmol/L

## 2021-03-21 LAB — LIPID PANEL
Cholesterol: 144 mg/dL (ref <200)
HDL: 49 mg/dL — ABNORMAL LOW (ref >=50)
LDL Cholesterol (Calc): 76 mg/dL (calc)
Non-HDL Cholesterol (Calc): 95 mg/dL (calc) (ref <130)
Total CHOL/HDL Ratio: 2.9 (calc) (ref <5.0)
Triglycerides: 105 mg/dL (ref <150)

## 2021-03-21 LAB — IRON,TIBC AND FERRITIN PANEL
%SAT: 16 % (calc) (ref 16–45)
Ferritin: 42 ng/mL (ref 16–288)
Iron: 54 ug/dL (ref 45–160)
TIBC: 328 mcg/dL (calc) (ref 250–450)

## 2021-04-04 ENCOUNTER — Ambulatory Visit: Payer: 59 | Admitting: Nurse Practitioner

## 2021-05-11 ENCOUNTER — Encounter: Payer: Self-pay | Admitting: Nurse Practitioner

## 2021-05-11 ENCOUNTER — Ambulatory Visit (INDEPENDENT_AMBULATORY_CARE_PROVIDER_SITE_OTHER): Payer: 59 | Admitting: Nurse Practitioner

## 2021-05-11 ENCOUNTER — Telehealth: Payer: Self-pay | Admitting: *Deleted

## 2021-05-11 VITALS — BP 128/80 | HR 68 | Temp 97.1°F | Ht 65.0 in | Wt 197.0 lb

## 2021-05-11 DIAGNOSIS — F3341 Major depressive disorder, recurrent, in partial remission: Secondary | ICD-10-CM | POA: Diagnosis not present

## 2021-05-11 DIAGNOSIS — E1140 Type 2 diabetes mellitus with diabetic neuropathy, unspecified: Secondary | ICD-10-CM | POA: Diagnosis not present

## 2021-05-11 DIAGNOSIS — Z0000 Encounter for general adult medical examination without abnormal findings: Secondary | ICD-10-CM

## 2021-05-11 DIAGNOSIS — E2839 Other primary ovarian failure: Secondary | ICD-10-CM

## 2021-05-11 DIAGNOSIS — Z1231 Encounter for screening mammogram for malignant neoplasm of breast: Secondary | ICD-10-CM

## 2021-05-11 DIAGNOSIS — M159 Polyosteoarthritis, unspecified: Secondary | ICD-10-CM

## 2021-05-11 DIAGNOSIS — Z794 Long term (current) use of insulin: Secondary | ICD-10-CM

## 2021-05-11 DIAGNOSIS — E782 Mixed hyperlipidemia: Secondary | ICD-10-CM

## 2021-05-11 DIAGNOSIS — N3281 Overactive bladder: Secondary | ICD-10-CM

## 2021-05-11 DIAGNOSIS — N8110 Cystocele, unspecified: Secondary | ICD-10-CM

## 2021-05-11 DIAGNOSIS — Z124 Encounter for screening for malignant neoplasm of cervix: Secondary | ICD-10-CM

## 2021-05-11 NOTE — Telephone Encounter (Signed)
Yes that is totally fine

## 2021-05-11 NOTE — Telephone Encounter (Signed)
Patient notified and agreed.  

## 2021-05-11 NOTE — Telephone Encounter (Signed)
Patient called and stated that her Mammogram and Bone Density is Not covered by her [employer] Insurance per [facility].  ? ?Patient stated that she would like to wait a couple more month until her Medicare kicks in to get her Mammogram and Bone Density if that is ok.  ? ?Please Advise.  ?

## 2021-05-11 NOTE — Progress Notes (Signed)
? ?Provider: Sharon SellerEubanks, Caryn Gienger K, NP ? ?Patient Care Team: ?Sharon SellerEubanks, Danira Nylander K, NP as PCP - General (Geriatric Medicine) ?Zelphia CairoAdkins, Gretchen, MD as Consulting Physician (Obstetrics and Gynecology) ? ?Extended Emergency Contact Information ?Primary Emergency Contact: Solan,Opal ?Address: [address] [place]?         [place], [place] [id number] [place] ?Mobile Phone: [phone] ?Relation: Daughter ?Secondary Emergency Contact: Foust,Janet ? [place] ?Mobile Phone: [phone] ?Relation: Daughter ?Allergies  ?Allergen Reactions  ? Morphine And Related Itching  ? ?Code Status: FULL ?Goals of Care: Advanced Directive information ? ?  05/11/2021  ?  8:50 AM  ?Advanced Directives  ?Does Patient Have a Medical Advance Directive? No  ?Would patient like information on creating a medical advance directive? No - Patient declined  ? ? ? ?Chief Complaint  ?Patient presents with  ? Follow-up  ?  6 week follow-up. Discuss need for shingrix, mammogram, urine microalbumin, eye exam, and pap smear. NCIR verified. Patient denies receiving any vaccines since last visit. Nauseated since Monday, patient experienced a vomiting episode after taking medications w/o eating.   ? ? ?HPI: Patient is a 66 y.o. female [facility]  ?Reports she is up to date on dentist visit ?Exercising 45 mins 5 days a week.  ?Her current eye doctor does not take her insurance so she needs new ophthalmologist  ? ? ?  03/19/2021  ?  8:37 AM 03/29/2019  ? 10:48 AM 12/28/2018  ? 11:06 AM 09/30/2018  ? 10:57 AM 06/03/2018  ?  2:40 PM  ?Depression screen PHQ 2/9  ?Decreased Interest 0 0 0 0 0  ?Down, Depressed, Hopeless 0 0 0 0 0  ?PHQ - 2 Score 0 0 0 0 0  ? ? ? ?  09/30/2018  ? 10:57 AM 12/28/2018  ? 11:06 AM 03/29/2019  ? 10:48 AM 03/19/2021  ?  8:37 AM 05/11/2021  ?  8:50 AM  ?Fall Risk  ?Falls in the past year? 0 0 0 0 0  ?Was there an injury with Fall?    0 0  ?Fall Risk Category Calculator    0 0  ?Fall Risk Category     Low Low  ?Patient Fall Risk Level Low fall risk   Low fall risk Low fall risk  ?Patient at Risk for Falls Due to    No Fall Risks No Fall Risks  ?Fall risk Follow up    Falls evaluation completed Falls evaluation completed  ? ?   ? View : No data to display.  ?  ?  ?  ? ? ? ?Health Maintenance  ?Topic Date Due  ? Zoster Vaccines- Shingrix (1 of 2) Never done  ? MAMMOGRAM  09/17/2017  ? URINE MICROALBUMIN  09/30/2019  ? OPHTHALMOLOGY EXAM  04/12/2020  ? PAP SMEAR-Modifier  01/16/2021  ? INFLUENZA VACCINE  08/21/2021  ? HEMOGLOBIN A1C  09/16/2021  ? FOOT EXAM  02/12/2022  ? TETANUS/TDAP  07/19/2024  ? COLONOSCOPY (Pts [phone] Insurance coverage will need to be confirmed)  06/23/2028  ? COVID-19 Vaccine  Completed  ? Hepatitis C Screening  Completed  ? HIV Screening  Completed  ? HPV VACCINES  Aged Out  ? ? ?Past Medical History:  ?Diagnosis Date  ? Anemia   ? Arthritis   ? "knees, hands" (08/04/2012)  ? Chronic lower back pain   ? "all the time" (08/04/2012)  ? Dysrhythmia   ? Tachycardia, comes and  goes. Increases with activity  ? History of blood transfusion 01/21/2002  ? "related to knee replacement" (08/04/2012)  ? History of blood transfusion   ? Hyperlipemia   ? Tachycardia   ? Type II diabetes mellitus (HCC)   ? ? ?Past Surgical History:  ?Procedure Laterality Date  ? CARPAL TUNNEL RELEASE Left 09/21/1988  ? HERNIA REPAIR  01/21/1998  ? umbilical  ? TOTAL KNEE ARTHROPLASTY Left 10/22/2002  ? TOTAL KNEE REVISION Left 08/04/2012  ? TOTAL KNEE REVISION Left 08/04/2012  ? Procedure: LEFT TOTAL KNEE REVISION;  Surgeon: Cammy Copa, MD;  Location[facility];  Service: Orthopedics;  Laterality: Left;  ? TUBAL LIGATION  01/21/1978  ? ? ?Social History  ? ?Socioeconomic History  ? Marital status: Single  ?  Spouse name: Not on file  ? Number of children: Not on file  ? Years of education: Not on file  ? Highest education level: Not on file  ?Occupational History  ? Not on file  ?Tobacco Use  ? Smoking status: Former   ?  Packs/day: 1.00  ?  Years: 23.00  ?  Pack years: 23.00  ?  Types: Cigarettes  ?  Quit date: 12/01/1998  ?  Years since quitting: 22.4  ? Smokeless tobacco: Never  ?Vaping Use  ? Vaping Use: Never used  ?Substance and Sexual Activity  ? Alcohol use: Not Currently  ?  Comment: 08/05/2012 "don't drink anymore; stopped ~ 11/1998; never had problem w/it"  ? Drug use: No  ? Sexual activity: Not on file  ?Other Topics Concern  ? Not on file  ?Social History Narrative  ? Tobacco use, amount per day now:  ? Past tobacco use, amount per day: 1 pack a day 40 years ago.  ? How many years did you use tobacco:  ? Alcohol use (drinks per week):  ? Diet:  ? Do you drink/eat things with caffeine: Coffee 1 cup a day.  ? Marital status:  Single                                What year were you married?  ? Do you live in a house, apartment, assisted living, condo, trailer, etc.? Apartment   ? Is it one or more stories? 15 stairs.  ? How many persons live in your home? 2  ? Do you have pets in your home?( please list)  None.  ? Highest Level of education completed? 12 grade.  ? Current or past profession: Personnel officer.  ? Do you exercise?  Monday-Friday                                Type and how often? [employer] 45 minutes.  ? Do you have a living will? No  ? Do you have a DNR form?    No                               If not, do you want to discuss one?  ? Do you have signed POA/HPOA forms?   No                     If so, please bring to you appointment  ?   ? Do you have any difficulty bathing or dressing yourself? No  ?  Do you have any difficulty preparing food or eating? No  ? Do you have any difficulty managing your medications? No  ? Do you have any difficulty managing your finances? No  ? Do you have any difficulty affording your medications? No  ? ?Social Determinants of Health  ? ?Financial Resource Strain: Not on file  ?Food Insecurity: Not on file  ?Transportation Needs: Not on file  ?Physical Activity: Not on file   ?Stress: Not on file  ?Social Connections: Not on file  ? ? ?Family History  ?Problem Relation Age of Onset  ? Diabetes Mother   ? Cancer Mother   ? Dementia Mother   ? Diabetes Father   ? Cancer Sister   ? Heart disease Sister   ? Diabetes Sister   ? Hypertension Sister   ? Stroke Sister   ? Diabetes Sister   ? Cancer Sister 65  ?     lung  ? Diabetes Sister   ? Diabetes Sister   ? Diabetes Mellitus I Daughter   ? ? ?Review of Systems:  ?Review of Systems  ?Constitutional:  Negative for chills, fever and weight loss.  ?HENT:  Negative for tinnitus.   ?Respiratory:  Negative for cough, sputum production and shortness of breath.   ?Cardiovascular:  Negative for chest pain, palpitations and leg swelling.  ?Gastrointestinal:  Negative for abdominal pain, constipation, diarrhea and heartburn.  ?Genitourinary:  Negative for dysuria, frequency and urgency.  ?Musculoskeletal:  Negative for back pain, falls, joint pain and myalgias.  ?Skin: Negative.   ?Neurological:  Negative for dizziness and headaches.  ?Psychiatric/Behavioral:  Negative for depression and memory loss. The patient does not have insomnia.   ? ? ?Allergies as of 05/11/2021   ? ?   Reactions  ? Morphine And Related Itching  ? ?  ? ?  ?Medication List  ?  ? ?  ? Accurate as of May 11, 2021  9:24 AM. If you have any questions, ask your nurse or doctor.  ?  ?  ? ?  ? ?aspirin EC 81 MG tablet ?Take 81 mg by mouth daily. ?  ?glimepiride 4 MG tablet ?Commonly known as: AMARYL ?Take 4 mg by mouth daily with breakfast. ?  ?metFORMIN 1000 MG (MOD) 24 hr tablet ?Commonly known as: GLUMETZA ?Take 1 tablet (1,000 mg total) by mouth 2 (two) times daily with a meal. ?  ?methocarbamol 500 MG tablet ?Commonly known as: ROBAXIN ?Take 1 tablet (500 mg total) by mouth every 8 (eight) hours as needed for muscle spasms. ?  ?nystatin-triamcinolone ointment ?Commonly known as: MYCOLOG ?Apply 1 application topically 2 (two) times daily. As needed to affected area. ?  ?oxybutynin  5 MG 24 hr tablet ?Commonly known as: DITROPAN-XL ?Take 5 mg by mouth at bedtime. ?  ?OZEMPIC (1 MG/DOSE) [place] ?Inject into the skin once a week. ?  ?simvastatin 40 MG tablet ?Commonly known as: ZOCOR ?T

## 2021-05-12 LAB — MICROALBUMIN / CREATININE URINE RATIO
Creatinine, Urine: 172 mg/dL (ref 20–275)
Microalb Creat Ratio: 4 mcg/mg creat (ref <30)
Microalb, Ur: 0.7 mg/dL

## 2021-05-16 LAB — PAP IG (IMAGE GUIDED)

## 2021-06-26 ENCOUNTER — Encounter: Payer: 59 | Admitting: Internal Medicine

## 2021-07-31 ENCOUNTER — Ambulatory Visit (INDEPENDENT_AMBULATORY_CARE_PROVIDER_SITE_OTHER): Payer: 59 | Admitting: Physician Assistant

## 2021-07-31 ENCOUNTER — Telehealth: Payer: Self-pay | Admitting: Physician Assistant

## 2021-07-31 ENCOUNTER — Encounter: Payer: Self-pay | Admitting: Physician Assistant

## 2021-07-31 DIAGNOSIS — L720 Epidermal cyst: Secondary | ICD-10-CM | POA: Diagnosis not present

## 2021-07-31 DIAGNOSIS — R202 Paresthesia of skin: Secondary | ICD-10-CM

## 2021-07-31 MED ORDER — CLOBETASOL PROPIONATE 0.05 % EX FOAM: CUTANEOUS | 3 refills | Status: DC

## 2021-07-31 NOTE — Telephone Encounter (Signed)
Phone call to patient to inform her that we can do a prior authorization or she can have the prescription sent to a [facility] pharmacy and she can pay cash for the medication. Voicemail left for patient to give the office a call back.

## 2021-07-31 NOTE — Progress Notes (Signed)
   New Patient   Subjective  Brittany Klein is a 66 y.o. female who presents for the following: Annual Exam (Scalp itch for years mostly over the counter treatments, under arms possible cyst x years boils when she was young.).   The following portions of the chart were reviewed this encounter and updated as appropriate:  Tobacco  Allergies  Meds  Problems  Med Hx  Surg Hx  Fam Hx      Objective  Well appearing patient in no apparent distress; mood and affect are within normal limits.  All skin waist up examined.  Right Axilla Open comedome with dense dermal nodule. Numerous small open comedomes with some scarring from cysts that she had in her 20's.  Scalp Intermittent, severe pruritis.   Assessment & Plan  Epidermal cyst Right Axilla  30 surgery for cyst  Notalgia paresthetica Scalp  clobetasol (OLUX) 0.05 % topical foam - Scalp Apply to scalp itch nightly to as needed     I, Twisha Vanpelt, PA-C, have reviewed all documentation's for this visit.  The documentation on 07/31/21 for the exam, diagnosis, procedures and orders are all accurate and complete.

## 2021-07-31 NOTE — Telephone Encounter (Signed)
Patient left message on office voice mail that per her pharmacy  [facility] [phone] [place], [place] - [address] AT [facility] [address] & [address] (Ph: [phone]  A prior authorization is needed for the clobetasol clobetasol (OLUX) 0.05 % topical foam

## 2021-07-31 NOTE — Telephone Encounter (Signed)
Prior authorization done through [facility] for the patient's Clobetasol Foam.    [facility] has not yet replied to your PA request. You may close this dialog, return to your dashboard, and perform other tasks.  To check for an update later, open this request again from your dashboard.  If [facility] has not replied within 72 hours for urgent requests and up to 15 days for standard requests, please contact [facility] at [phone].

## 2021-08-21 ENCOUNTER — Ambulatory Visit: Payer: 59 | Admitting: Nurse Practitioner

## 2021-09-25 ENCOUNTER — Other Ambulatory Visit: Payer: Self-pay | Admitting: *Deleted

## 2021-09-25 DIAGNOSIS — E119 Type 2 diabetes mellitus without complications: Secondary | ICD-10-CM

## 2021-09-25 DIAGNOSIS — E782 Mixed hyperlipidemia: Secondary | ICD-10-CM

## 2021-09-25 MED ORDER — METFORMIN HCL ER (MOD) 1000 MG PO TB24: 1000.0000 mg | ORAL_TABLET | Freq: Two times a day (BID) | ORAL | 0 refills | Status: DC

## 2021-09-25 MED ORDER — OZEMPIC (1 MG/DOSE) 2 MG/1.5ML ~~LOC~~ SOPN: 1.0000 mg | PEN_INJECTOR | SUBCUTANEOUS | 0 refills | Status: AC

## 2021-09-25 MED ORDER — GLIMEPIRIDE 4 MG PO TABS: 4.0000 mg | ORAL_TABLET | Freq: Every day | ORAL | 0 refills | Status: DC

## 2021-09-25 MED ORDER — SIMVASTATIN 40 MG PO TABS: ORAL_TABLET | ORAL | 0 refills | Status: DC

## 2021-09-25 NOTE — Telephone Encounter (Signed)
Patient walked into office requesting refills. Stated that she is out of medications and her appointment isn't until 9 days away.   Pended Rx's and sent to [facility] for approval.

## 2021-09-26 ENCOUNTER — Encounter: Payer: 59 | Admitting: Physician Assistant

## 2021-09-28 ENCOUNTER — Other Ambulatory Visit: Payer: Self-pay | Admitting: Nurse Practitioner

## 2021-09-28 DIAGNOSIS — E119 Type 2 diabetes mellitus without complications: Secondary | ICD-10-CM

## 2021-10-01 NOTE — Telephone Encounter (Signed)
Pharmacy has request alternative medication for patient. Medication pend and routed to PCP [name], Janene Harvey, NP

## 2021-10-04 ENCOUNTER — Encounter: Payer: Self-pay | Admitting: Nurse Practitioner

## 2021-10-04 ENCOUNTER — Encounter: Payer: Self-pay | Admitting: *Deleted

## 2021-10-04 DIAGNOSIS — H35033 Hypertensive retinopathy, bilateral: Secondary | ICD-10-CM | POA: Diagnosis not present

## 2021-10-04 DIAGNOSIS — H0288A Meibomian gland dysfunction right eye, upper and lower eyelids: Secondary | ICD-10-CM | POA: Diagnosis not present

## 2021-10-04 DIAGNOSIS — E119 Type 2 diabetes mellitus without complications: Secondary | ICD-10-CM | POA: Diagnosis not present

## 2021-10-04 DIAGNOSIS — H04123 Dry eye syndrome of bilateral lacrimal glands: Secondary | ICD-10-CM | POA: Diagnosis not present

## 2021-10-04 DIAGNOSIS — H0288B Meibomian gland dysfunction left eye, upper and lower eyelids: Secondary | ICD-10-CM | POA: Diagnosis not present

## 2021-10-04 DIAGNOSIS — H2513 Age-related nuclear cataract, bilateral: Secondary | ICD-10-CM | POA: Diagnosis not present

## 2021-10-04 DIAGNOSIS — H40013 Open angle with borderline findings, low risk, bilateral: Secondary | ICD-10-CM | POA: Diagnosis not present

## 2021-10-04 LAB — HM DIABETES EYE EXAM

## 2021-10-05 ENCOUNTER — Encounter: Payer: Self-pay | Admitting: Nurse Practitioner

## 2021-10-05 ENCOUNTER — Ambulatory Visit (INDEPENDENT_AMBULATORY_CARE_PROVIDER_SITE_OTHER): Payer: Medicare HMO | Admitting: Nurse Practitioner

## 2021-10-05 VITALS — BP 128/78 | HR 80 | Temp 97.3°F | Ht 65.0 in | Wt 195.0 lb

## 2021-10-05 DIAGNOSIS — Z794 Long term (current) use of insulin: Secondary | ICD-10-CM | POA: Diagnosis not present

## 2021-10-05 DIAGNOSIS — G8929 Other chronic pain: Secondary | ICD-10-CM

## 2021-10-05 DIAGNOSIS — E782 Mixed hyperlipidemia: Secondary | ICD-10-CM

## 2021-10-05 DIAGNOSIS — E1140 Type 2 diabetes mellitus with diabetic neuropathy, unspecified: Secondary | ICD-10-CM | POA: Diagnosis not present

## 2021-10-05 DIAGNOSIS — N3281 Overactive bladder: Secondary | ICD-10-CM

## 2021-10-05 DIAGNOSIS — Z23 Encounter for immunization: Secondary | ICD-10-CM | POA: Diagnosis not present

## 2021-10-05 DIAGNOSIS — L720 Epidermal cyst: Secondary | ICD-10-CM | POA: Diagnosis not present

## 2021-10-05 DIAGNOSIS — M159 Polyosteoarthritis, unspecified: Secondary | ICD-10-CM | POA: Diagnosis not present

## 2021-10-05 DIAGNOSIS — D649 Anemia, unspecified: Secondary | ICD-10-CM | POA: Diagnosis not present

## 2021-10-05 DIAGNOSIS — M545 Low back pain, unspecified: Secondary | ICD-10-CM | POA: Diagnosis not present

## 2021-10-05 DIAGNOSIS — L309 Dermatitis, unspecified: Secondary | ICD-10-CM | POA: Diagnosis not present

## 2021-10-05 DIAGNOSIS — M150 Primary generalized (osteo)arthritis: Secondary | ICD-10-CM

## 2021-10-05 MED ORDER — CONTOUR NEXT TEST VI STRP: 1.0000 | ORAL_STRIP | Freq: Every day | 11 refills | Status: DC

## 2021-10-05 MED ORDER — TRIAMCINOLONE ACETONIDE 0.1 % EX CREA: 1.0000 | TOPICAL_CREAM | Freq: Two times a day (BID) | CUTANEOUS | 0 refills | Status: DC

## 2021-10-05 NOTE — Patient Instructions (Signed)
-   Use a mild, unscented soap.  You can use what you like (Dove, Aveeno, etc). - Bathe every other day if possible.  - After the bath apply a good lotion (unscented).   -recommendations include- Aveeno eczema, Aquafor, Eucerin. -make sure to stay hydrated.  - Use the steroid ointment as indicated for flares.  Do use chronically.

## 2021-10-05 NOTE — Progress Notes (Unsigned)
Careteam: Patient Care Team: [name], NP as PCP - General (Geriatric Medicine) Zelphia Cairo, MD as Consulting Physician (Obstetrics and Gynecology) Glyn Ade, PA-C as Physician Assistant (Dermatology)  PLACE OF SERVICE:  [facility]  Advanced Directive information Does Patient Have a Medical Advance Directive?: No, Would patient like information on creating a medical advance directive?: Yes (MAU/Ambulatory/Procedural Areas - Information given) (Given at previous appointment)  Allergies  Allergen Reactions   Morphine And Related Itching    Chief Complaint  Patient presents with   Medical Management of Chronic Issues    3 month follow-up. Discuss need for shingrix, pap smear, flu vaccine, PCV, covid booster, and A1c.      HPI: Patient is a 66 y.o. female for routine follow up.  She has been in [place] about a month and a half. Has been active down there.  She has a rash on the back of her next. Itches and burns but has not gone away.  Has been ongoing for about 3 weeks.   OA_ doing well with there movement.   Hyperlipidemia- fasting today. Cont zocor.   Niece cooks well. Hopefully lab work is not worse- she normally eats more salad at home but ate different foods out in [place].   Had diabetic eye exam yesterday. No low blood sugars   Her dermatologist isnt there any more.   Review of Systems:  Review of Systems  Constitutional:  Negative for chills, fever and weight loss.  HENT:  Negative for tinnitus.   Respiratory:  Negative for cough, sputum production and shortness of breath.   Cardiovascular:  Negative for chest pain, palpitations and leg swelling.  Gastrointestinal:  Negative for abdominal pain, constipation, diarrhea and heartburn.  Genitourinary:  Negative for dysuria, frequency and urgency.  Musculoskeletal:  Positive for back pain and joint pain. Negative for falls and myalgias.  Skin:  Positive for itching and rash.  Neurological:   Negative for dizziness and headaches.  Psychiatric/Behavioral:  Negative for depression and memory loss. The patient does not have insomnia.   ***  Past Medical History:  Diagnosis Date   Anemia    Arthritis    "knees, hands" (08/04/2012)   Chronic lower back pain    "all the time" (08/04/2012)   Dysrhythmia    Tachycardia, comes and goes. Increases with activity   History of blood transfusion 01/21/2002   "related to knee replacement" (08/04/2012)   History of blood transfusion    Hyperlipemia    Tachycardia    Type II diabetes mellitus [facility])    Past Surgical History:  Procedure Laterality Date   CARPAL TUNNEL RELEASE Left 09/21/1988   HERNIA REPAIR  01/21/1998   umbilical   TOTAL KNEE ARTHROPLASTY Left 10/22/2002   TOTAL KNEE REVISION Left 08/04/2012   TOTAL KNEE REVISION Left 08/04/2012   Procedure: LEFT TOTAL KNEE REVISION;  Surgeon: Cammy Copa, MD;  Location: [facility] OR;  Service: Orthopedics;  Laterality: Left;   TUBAL LIGATION  01/21/1978   Social History:   reports that she quit smoking about 22 years ago. Her smoking use included cigarettes. She has a 23.00 pack-year smoking history. She has never used smokeless tobacco. She reports that she does not currently use alcohol. She reports that she does not use drugs.  Family History  Problem Relation Age of Onset   Diabetes Mother    Cancer Mother    Dementia Mother    Diabetes Father    Cancer Sister  Heart disease Sister    Diabetes Sister    Hypertension Sister    Stroke Sister    Diabetes Sister    Cancer Sister 7       lung   Diabetes Sister    Diabetes Sister    Diabetes Mellitus I Daughter     Medications: Patient's Medications  New Prescriptions   No medications on file  Previous Medications   ASPIRIN EC 81 MG TABLET    Take 81 mg by mouth daily.   CLOBETASOL (OLUX) 0.05 % TOPICAL FOAM    Apply to scalp itch nightly to as needed   GLIMEPIRIDE (AMARYL) 4 MG TABLET    Take 1 tablet (4 mg  total) by mouth daily with breakfast.   METFORMIN (GLUCOPHAGE) 1000 MG TABLET    Take 1 tablet (1,000 mg total) by mouth 2 (two) times daily with a meal.   METHOCARBAMOL (ROBAXIN) 500 MG TABLET    Take 1 tablet (500 mg total) by mouth every 8 (eight) hours as needed for muscle spasms.   NYSTATIN-TRIAMCINOLONE OINTMENT (MYCOLOG)    Apply 1 application topically 2 (two) times daily. As needed to affected area.   OXYBUTYNIN (DITROPAN-XL) 5 MG 24 HR TABLET    Take 5 mg by mouth at bedtime.   SEMAGLUTIDE, 1 MG/DOSE, (OZEMPIC, 1 MG/DOSE,) 2 MG/1.5ML SOPN    Inject 1 mg into the skin once a week.   SIMVASTATIN (ZOCOR) 40 MG TABLET    TAKE 1 TABLET(40 MG) BY MOUTH DAILY   TRAMADOL (ULTRAM) 50 MG TABLET    Take by mouth every 8 (eight) hours as needed.  Modified Medications   No medications on file  Discontinued Medications   No medications on file    Physical Exam:  Vitals:   10/05/21 0915  BP: (!) 142/80  Pulse: 80  Temp: (!) 97.3 F (36.3 C)  TempSrc: Temporal  SpO2: 98%  Weight: 195 lb (88.5 kg)  Height: 5\' 5"  (1.651 m)   Body mass index is 32.45 kg/m. Wt Readings from Last 3 Encounters:  10/05/21 195 lb (88.5 kg)  05/11/21 197 lb (89.4 kg)  03/19/21 198 lb (89.8 kg)    Physical Exam Constitutional:      General: She is not in acute distress.    Appearance: She is well-developed. She is not diaphoretic.  HENT:     Head: Normocephalic and atraumatic.     Mouth/Throat:     Pharynx: No oropharyngeal exudate.  Eyes:     Conjunctiva/sclera: Conjunctivae normal.     Pupils: Pupils are equal, round, and reactive to light.  Cardiovascular:     Rate and Rhythm: Normal rate and regular rhythm.     Heart sounds: Normal heart sounds.  Pulmonary:     Effort: Pulmonary effort is normal.     Breath sounds: Normal breath sounds.  Abdominal:     General: Bowel sounds are normal.     Palpations: Abdomen is soft.  Musculoskeletal:     Cervical back: Normal range of motion and neck  supple.     Right lower leg: No edema.     Left lower leg: No edema.  Skin:    General: Skin is warm and dry.  Neurological:     Mental Status: She is alert and oriented to person, place, and time. Mental status is at baseline.  Psychiatric:        Mood and Affect: Mood normal.     Labs reviewed: Basic Metabolic Panel: Recent  Labs    03/19/21 0916  NA 140  K 5.4*  CL 104  CO2 29  GLUCOSE 112*  BUN 16  CREATININE 0.97  CALCIUM 9.8   Liver Function Tests: Recent Labs    03/19/21 0916  AST 17  ALT 16  BILITOT 0.3  PROT 7.1   No results for input(s): "LIPASE", "AMYLASE" in the last 8760 hours. No results for input(s): "AMMONIA" in the last 8760 hours. CBC: Recent Labs    03/19/21 0916  WBC 7.0  NEUTROABS 3,885  HGB 10.1*  HCT 33.0*  MCV 84.6  PLT 323   Lipid Panel: Recent Labs    03/19/21 0916  CHOL 144  HDL 49*  LDLCALC 76  TRIG 277  CHOLHDL 2.9   TSH: No results for input(s): "TSH" in the last 8760 hours. A1C: Lab Results  Component Value Date   HGBA1C 6.4 (H) 03/19/2021     Assessment/Plan 1. Type 2 diabetes mellitus with diabetic neuropathy, with long-term current use of insulin (HCC) *** - Hemoglobin A1c - Flu Vaccine QUAD High Dose(Fluad) - CBC with Differential/Platelet  2. Need for influenza vaccination *** - Flu Vaccine QUAD High Dose(Fluad)  3. Mixed hyperlipidemia *** - Lipid panel - COMPLETE METABOLIC PANEL WITH GFR  4. Primary osteoarthritis involving multiple joints ***  5. Anemia, unspecified type *** - CBC with Differential/Platelet  6. Overactive bladder ***  7. Chronic low back pain without sciatica, unspecified back pain laterality ***  8. Dermatitis -- Use a mild, unscented soap.  You can use what you like (Dove, Aveeno, etc). - Bathe every other day if possible.  - After the bath apply a good lotion (unscented).   -make sure to stay hydrated.  - Use the steroid ointment as indicated for flares.  Do  use chronically. - triamcinolone cream (KENALOG) 0.1 %; Apply 1 Application topically 2 (two) times daily.  Dispense: 30 g; Refill: 0  9. Epidermal cyst Needing new referral for dermatology  - Ambulatory referral to Dermatology    No follow-ups on file.: *** Indalecio Malmstrom K. Biagio Borg  [facility] [phone]

## 2021-10-06 LAB — COMPLETE METABOLIC PANEL WITH GFR
AG Ratio: 1.4 (calc) (ref 1.0–2.5)
ALT: 13 U/L (ref 6–29)
AST: 15 U/L (ref 10–35)
Albumin: 3.8 g/dL (ref 3.6–5.1)
Alkaline phosphatase (APISO): 74 U/L (ref 37–153)
BUN: 21 mg/dL (ref 7–25)
CO2: 28 mmol/L (ref 20–32)
Calcium: 8.9 mg/dL (ref 8.6–10.4)
Chloride: 104 mmol/L (ref 98–110)
Creat: 0.92 mg/dL (ref 0.50–1.05)
Globulin: 2.7 g/dL (calc) (ref 1.9–3.7)
Glucose, Bld: 98 mg/dL (ref 65–99)
Potassium: 4.7 mmol/L (ref 3.5–5.3)
Sodium: 140 mmol/L (ref 135–146)
Total Bilirubin: 0.3 mg/dL (ref 0.2–1.2)
Total Protein: 6.5 g/dL (ref 6.1–8.1)
eGFR: 69 mL/min/{1.73_m2} (ref >=60)

## 2021-10-06 LAB — CBC WITH DIFFERENTIAL/PLATELET
Absolute Monocytes: 378 cells/uL (ref 200–950)
Basophils Absolute: 32 cells/uL (ref 0–200)
Basophils Relative: 0.5 %
Eosinophils Absolute: 132 cells/uL (ref 15–500)
Eosinophils Relative: 2.1 %
HCT: 29.9 % — ABNORMAL LOW (ref 35.0–45.0)
Hemoglobin: 9.3 g/dL — ABNORMAL LOW (ref 11.7–15.5)
Lymphs Abs: 2526 cells/uL (ref 850–3900)
MCH: 26.2 pg — ABNORMAL LOW (ref 27.0–33.0)
MCHC: 31.1 g/dL — ABNORMAL LOW (ref 32.0–36.0)
MCV: 84.2 fL (ref 80.0–100.0)
MPV: 10.2 fL (ref 7.5–12.5)
Monocytes Relative: 6 %
Neutro Abs: 3232 cells/uL (ref 1500–7800)
Neutrophils Relative %: 51.3 %
Platelets: 282 10*3/uL (ref 140–400)
RBC: 3.55 10*6/uL — ABNORMAL LOW (ref 3.80–5.10)
RDW: 13.1 % (ref 11.0–15.0)
Total Lymphocyte: 40.1 %
WBC: 6.3 10*3/uL (ref 3.8–10.8)

## 2021-10-06 LAB — LIPID PANEL
Cholesterol: 145 mg/dL (ref <200)
HDL: 55 mg/dL (ref >=50)
LDL Cholesterol (Calc): 75 mg/dL (calc)
Non-HDL Cholesterol (Calc): 90 mg/dL (calc) (ref <130)
Total CHOL/HDL Ratio: 2.6 (calc) (ref <5.0)
Triglycerides: 71 mg/dL (ref <150)

## 2021-10-06 LAB — HEMOGLOBIN A1C
Hgb A1c MFr Bld: 5.9 % of total Hgb — ABNORMAL HIGH (ref <5.7)
Mean Plasma Glucose: 123 mg/dL
eAG (mmol/L): 6.8 mmol/L

## 2021-10-11 DIAGNOSIS — Z01419 Encounter for gynecological examination (general) (routine) without abnormal findings: Secondary | ICD-10-CM | POA: Diagnosis not present

## 2021-10-11 DIAGNOSIS — Z6833 Body mass index (BMI) 33.0-33.9, adult: Secondary | ICD-10-CM | POA: Diagnosis not present

## 2021-10-11 DIAGNOSIS — N958 Other specified menopausal and perimenopausal disorders: Secondary | ICD-10-CM | POA: Diagnosis not present

## 2021-10-11 DIAGNOSIS — Z1231 Encounter for screening mammogram for malignant neoplasm of breast: Secondary | ICD-10-CM | POA: Diagnosis not present

## 2021-10-11 LAB — HM DEXA SCAN

## 2021-10-12 ENCOUNTER — Telehealth: Payer: Self-pay | Admitting: Nurse Practitioner

## 2021-10-12 NOTE — Telephone Encounter (Signed)
Pt dropped off [employer] Patient Assistance Application for provider to complete. Call pt when ready for pt to pick it up.

## 2021-10-12 NOTE — Telephone Encounter (Signed)
Form received for [employer] Patient Assitance Program for [employer].   Filled out and placed in Brent folder to review and sign.   To call patient once ready for pick up.

## 2021-10-15 ENCOUNTER — Other Ambulatory Visit: Payer: Self-pay | Admitting: Obstetrics and Gynecology

## 2021-10-15 DIAGNOSIS — R928 Other abnormal and inconclusive findings on diagnostic imaging of breast: Secondary | ICD-10-CM

## 2021-10-17 NOTE — Telephone Encounter (Signed)
Called patient and informed her that form is ready to pick up. Form placed up front in patient pick up cabinet.

## 2021-10-18 ENCOUNTER — Telehealth: Payer: Self-pay | Admitting: Nurse Practitioner

## 2021-10-18 DIAGNOSIS — E1140 Type 2 diabetes mellitus with diabetic neuropathy, unspecified: Secondary | ICD-10-CM

## 2021-10-18 MED ORDER — ONETOUCH VERIO VI STRP: ORAL_STRIP | 4 refills | Status: DC

## 2021-10-18 MED ORDER — ONETOUCH VERIO REFLECT W/DEVICE KIT: 1.0000 | PACK | Freq: Two times a day (BID) | 0 refills | Status: DC

## 2021-10-18 NOTE — Addendum Note (Signed)
Addended by: Casimer Leek C on: 10/18/2021 03:09 PM   Modules accepted: Orders

## 2021-10-18 NOTE — Telephone Encounter (Signed)
Please call in the STRIPS only to [facility].   Pt called back to report she does not need prescription for the meter, she is getting it free from her insurance company.

## 2021-10-18 NOTE — Telephone Encounter (Signed)
Test strips was send into pharmacy and patient was advised.

## 2021-10-18 NOTE — Telephone Encounter (Signed)
Called patient to get clarification on if she needs a written rx or can it be send into pharmacy.

## 2021-10-18 NOTE — Telephone Encounter (Signed)
Please send script to pharmacy as requested

## 2021-10-18 NOTE — Telephone Encounter (Signed)
From flyer patient left a rx for meter will have to be send into pharmacy as well and patient was advised. Meter kit send to [facility] for patient.

## 2021-10-18 NOTE — Telephone Encounter (Signed)
Pt request order for [facility] Verio Reflect glucose meter and a prescription for the strips. Pt has a voucher from [employer] to get it at no cost.

## 2021-10-18 NOTE — Addendum Note (Signed)
Addended by: Casimer Leek C on: 10/18/2021 01:48 PM   Modules accepted: Orders

## 2021-10-22 ENCOUNTER — Other Ambulatory Visit: Payer: Self-pay | Admitting: Obstetrics and Gynecology

## 2021-10-22 ENCOUNTER — Ambulatory Visit
Admission: RE | Admit: 2021-10-22 | Discharge: 2021-10-22 | Disposition: A | Discharge status: Home / Self Care | Payer: Medicare HMO | Source: Ambulatory Visit | Attending: Obstetrics and Gynecology | Admitting: Obstetrics and Gynecology

## 2021-10-22 DIAGNOSIS — R921 Mammographic calcification found on diagnostic imaging of breast: Secondary | ICD-10-CM | POA: Diagnosis not present

## 2021-10-22 DIAGNOSIS — R928 Other abnormal and inconclusive findings on diagnostic imaging of breast: Secondary | ICD-10-CM | POA: Diagnosis not present

## 2021-10-22 LAB — HM MAMMOGRAPHY

## 2021-11-01 ENCOUNTER — Telehealth: Payer: Self-pay

## 2021-11-01 NOTE — Telephone Encounter (Signed)
Radiographer, therapeutic received from [employer] with Ozempic 1x50mL 4PCS, Quantity 4 for patient.   Lot [id number], Exp 12-21-2023  Patient aware medication is available for pick-up M-F, 8 am-5 pm

## 2021-11-21 ENCOUNTER — Telehealth: Payer: Self-pay | Admitting: Orthopedic Surgery

## 2021-11-21 NOTE — Telephone Encounter (Signed)
Patient came in today stating her Apartment complex needs a letter from the Doctor stating that she is permeant disabled so that she can get a handicap parking space, apparently the government issues Handicap placcard is not enough for them they would like a letter. Please advise.

## 2021-11-21 NOTE — Telephone Encounter (Signed)
OV needed? Not seen since Aug 2022 and that was for neck.

## 2021-11-22 NOTE — Telephone Encounter (Signed)
Yes but we dont really do determinations of perm disability - we can say this is what she had and this is what she can do but I can't say she is perm disabled

## 2021-11-23 NOTE — Telephone Encounter (Signed)
I called and sw pt to advise of message below. Advised she could try talking with her PCP but per Dr. Marlou Sa he could see her and advise that she had a total knee replacement in 2014 but can not make the determination that she is disabled.

## 2021-12-10 DIAGNOSIS — L218 Other seborrheic dermatitis: Secondary | ICD-10-CM | POA: Diagnosis not present

## 2021-12-10 DIAGNOSIS — L732 Hidradenitis suppurativa: Secondary | ICD-10-CM | POA: Diagnosis not present

## 2021-12-10 NOTE — Telephone Encounter (Signed)
Received fax from [employer] stating that patient's application has been APPROVED through 01/20/2022.  Four Month supply of the medication is being shipped to the physician's office.

## 2021-12-18 ENCOUNTER — Telehealth: Payer: Self-pay

## 2021-12-18 NOTE — Telephone Encounter (Signed)
IC LMVM for patient advising handicap placard form can be picked up at front desk

## 2021-12-21 ENCOUNTER — Other Ambulatory Visit: Payer: Self-pay | Admitting: Nurse Practitioner

## 2021-12-21 DIAGNOSIS — E119 Type 2 diabetes mellitus without complications: Secondary | ICD-10-CM

## 2021-12-21 DIAGNOSIS — E782 Mixed hyperlipidemia: Secondary | ICD-10-CM

## 2021-12-31 ENCOUNTER — Other Ambulatory Visit: Payer: Self-pay | Admitting: Nurse Practitioner

## 2021-12-31 NOTE — Telephone Encounter (Signed)
Patient medication Glimepride 4mg  has warnings. Medication pend and sent to PCP [name], NP for approval. Please Advise.

## 2022-01-01 ENCOUNTER — Telehealth: Payer: Self-pay | Admitting: *Deleted

## 2022-01-01 NOTE — Telephone Encounter (Signed)
Patient dropped off [employer] Patient Assistance Program Application [phone] Fax: [phone] for her Ozempic and stated that she will pick up form once completed.   Placed form in Jessica's folder to review and sign.

## 2022-01-04 ENCOUNTER — Ambulatory Visit: Payer: Medicare HMO | Admitting: Surgical

## 2022-01-04 ENCOUNTER — Encounter: Payer: Self-pay | Admitting: Orthopedic Surgery

## 2022-01-04 DIAGNOSIS — M25561 Pain in right knee: Secondary | ICD-10-CM | POA: Diagnosis not present

## 2022-01-04 DIAGNOSIS — G8929 Other chronic pain: Secondary | ICD-10-CM | POA: Diagnosis not present

## 2022-01-04 NOTE — Progress Notes (Signed)
Office Visit Note   [name]           Date of Birth: 03-Mar-1955           MRN: [id number] Visit Date: 01/04/2022 Requested by: [name], NP [address] [place]. [place],  [place] [address] PCP: [name], NP  Subjective: No chief complaint on file.   HPI: Brittany Klein is a 66 y.o. female who presents to the office reporting bilateral knee pain.  Has history of left total knee replacement that is functioning well.  Also has history of end-stage right knee arthritis.  Overall the knees are fairly functional for her but if she walks too much the right knee gets aggravated.  She also has some neck pain that has been fairly well-controlled with occasional ESI's.  No new injury.  She has limited walking endurance.  Right knee will occasionally feel like want to give out on her but she has not had any falls.  No groin pain bilaterally.  No radicular pain bilaterally..                ROS: All systems reviewed are negative as they relate to the chief complaint within the history of present illness.  Patient denies fevers or chills.  Assessment & Plan: Visit Diagnoses: No diagnosis found.  Plan: Patient is a 66 year old female who presents complaining of occasional bilateral knee pain.  Overall pain is fairly well-controlled as long as she modifies her activity level and does not try to do too much.  Knee replacement is functional on the left side.  She would like handicap placard which was filled out for her today.  Follow-up with the office as needed.  She does not require any intervention today.  Follow-Up Instructions: No follow-ups on file.   Orders:  No orders of the defined types were placed in this encounter.  No orders of the defined types were placed in this encounter.     Procedures: No procedures performed   Clinical Data: No additional findings.  Objective: Vital Signs: There were no vitals taken for this visit.  Physical Exam:   Constitutional: Patient appears well-developed HEENT:  Head: Normocephalic Eyes:EOM are normal Neck: Normal range of motion Cardiovascular: Normal rate Pulmonary/chest: Effort normal Neurologic: Patient is alert Skin: Skin is warm Psychiatric: Patient has normal mood and affect  Ortho Exam: Ortho exam demonstrates with left knee with 0 degrees extension 115 degrees knee flexion.  No effusion present.  Right knee with no effusion.  Right knee flexes to 115 degrees of knee flexion.  No calf tenderness.  Negative Homans' sign.  Well-healed incision from prior left total knee arthroplasty in the left knee with no evidence of infection or dehiscence.  There is no sinus tract noted.  No pain with hip range of motion bilaterally.  Specialty Comments:  No specialty comments available.  Imaging: No results found.   PMFS History: Patient Active Problem List   Diagnosis Date Noted   Anemia 03/19/2021   Primary osteoarthritis involving multiple joints 03/19/2021   Recurrent major depressive disorder, in partial remission (HCC) 03/19/2021   Mixed hyperlipidemia 06/03/2018   Type 2 diabetes mellitus with diabetic neuropathy, with long-term current use of insulin (HCC) 03/04/2018   Prolapse of female bladder, acquired 03/04/2018   Past Medical History:  Diagnosis Date   Anemia    Arthritis    "knees, hands" (08/04/2012)   Chronic lower back pain    "all the time" (  08/04/2012)   Dysrhythmia    Tachycardia, comes and goes. Increases with activity   History of blood transfusion 01/21/2002   "related to knee replacement" (08/04/2012)   History of blood transfusion    Hyperlipemia    Tachycardia    Type II diabetes mellitus (HCC)     Family History  Problem Relation Age of Onset   Diabetes Mother    Cancer Mother    Dementia Mother    Diabetes Father    Cancer Sister    Heart disease Sister    Diabetes Sister    Hypertension Sister    Stroke Sister    Diabetes Sister    Cancer  Sister 75       lung   Diabetes Sister    Diabetes Sister    Diabetes Mellitus I Daughter     Past Surgical History:  Procedure Laterality Date   CARPAL TUNNEL RELEASE Left 09/21/1988   HERNIA REPAIR  01/21/1998   umbilical   TOTAL KNEE ARTHROPLASTY Left 10/22/2002   TOTAL KNEE REVISION Left 08/04/2012   TOTAL KNEE REVISION Left 08/04/2012   Procedure: LEFT TOTAL KNEE REVISION;  Surgeon: Cammy Copa, MD;  Location: [facility] OR;  Service: Orthopedics;  Laterality: Left;   TUBAL LIGATION  01/21/1978   Social History   Occupational History   Not on file  Tobacco Use   Smoking status: Former    Packs/day: 1.00    Years: 23.00    Total pack years: 23.00    Types: Cigarettes    Quit date: 12/01/1998    Years since quitting: 23.1   Smokeless tobacco: Never  Vaping Use   Vaping Use: Never used  Substance and Sexual Activity   Alcohol use: Not Currently    Comment: 08/05/2012 "don't drink anymore; stopped ~ 11/1998; never had problem w/it"   Drug use: No   Sexual activity: Not on file

## 2022-01-08 NOTE — Telephone Encounter (Signed)
Patient Assistance Form completed.  Called patient and she will pick up.  Copy sent to scanning. Original Form placed up front for pick up.

## 2022-02-01 ENCOUNTER — Ambulatory Visit (INDEPENDENT_AMBULATORY_CARE_PROVIDER_SITE_OTHER): Payer: Medicare HMO | Admitting: Family

## 2022-02-01 ENCOUNTER — Encounter: Payer: Self-pay | Admitting: Family

## 2022-02-01 VITALS — BP 122/80 | HR 74 | Temp 97.3°F | Resp 16 | Ht 65.0 in | Wt 194.0 lb

## 2022-02-01 DIAGNOSIS — G8929 Other chronic pain: Secondary | ICD-10-CM | POA: Diagnosis not present

## 2022-02-01 DIAGNOSIS — M25511 Pain in right shoulder: Secondary | ICD-10-CM | POA: Diagnosis not present

## 2022-02-01 NOTE — Progress Notes (Signed)
Provider: Andrej Spagnoli FNP-C  Lauree Chandler, NP  Patient Care Team: Lauree Chandler, NP as PCP - General (Geriatric Medicine) Marylynn Pearson, MD as Consulting Physician (Obstetrics and Gynecology) Warren Danes, PA-C as Physician Assistant (Dermatology)  Extended Emergency Contact Information Primary Emergency Contact: Puryear,Opal Address: [phone]          [place], [place] [address] [place] [employer] [place] Phone: [phone] Relation: Daughter Secondary Emergency Contact: [place] Mobile Phone: [phone] Relation: Daughter  Code Status:  Full Code  Goals of care: Advanced Directive information    02/01/2022    8:51 AM  Advanced Directives  Does Patient Have a Medical Advance Directive? No  Would patient like information on creating a medical advance directive? No - Patient declined     Chief Complaint  Patient presents with   Acute Visit    Patient complains of ongoing right shoulder pain.     HPI:  Pt is a 67 y.o. female seen today for an acute visit for evaluation of chronic right shoulder pain.follows up with Ortho care.states pain flares up.  MR A done showed 7/28/2022Rotator cuff tendinopathy with a supraspinatus tendon tear. The anterior 1 cm of the tear is full-thickness. More posteriorly, an additional 1 cm of the tear involves the undersurface of the tendon. Retraction is 1.5-2.5 cm. No atrophy.   Moderate to moderately severe acromioclavicular osteoarthritis. Type 2 acromion and subacromial spurring also noted.   Degenerated and frayed superior labrum.  Past Medical History:  Diagnosis Date   Anemia    Arthritis    "knees, hands" (08/04/2012)   Chronic lower back pain    "all the time" (08/04/2012)   Dysrhythmia    Tachycardia, comes and goes. Increases with activity   History of blood transfusion 01/21/2002   "related to knee replacement" (08/04/2012)   History of blood transfusion     Hyperlipemia    Tachycardia    Type II diabetes mellitus [facility])    Past Surgical History:  Procedure Laterality Date   CARPAL TUNNEL RELEASE Left 09/21/1988   HERNIA REPAIR  27/78/2423   umbilical   TOTAL KNEE ARTHROPLASTY Left 10/22/2002   TOTAL KNEE REVISION Left 08/04/2012   TOTAL KNEE REVISION Left 08/04/2012   Procedure: LEFT TOTAL KNEE REVISION;  Surgeon: Meredith Pel, MD;  Location: [facility];  Service: Orthopedics;  Laterality: Left;   TUBAL LIGATION  01/21/1978    Allergies  Allergen Reactions   Morphine And Related Itching    Outpatient Encounter Medications as of 02/01/2022  Medication Sig   aspirin EC 81 MG tablet Take 81 mg by mouth daily.   Blood Glucose Monitoring Suppl (ONETOUCH VERIO REFLECT) w/Device KIT 1 Device by Does not apply route in the morning and at bedtime.   glimepiride (AMARYL) 4 MG tablet TAKE 1 TABLET BY MOUTH DAILY WITH BREAKFAST   glucose blood (ONETOUCH VERIO) test strip Use as instructed   metFORMIN (GLUCOPHAGE) 1000 MG tablet Take 1 tablet (1,000 mg total) by mouth 2 (two) times daily with a meal.   Semaglutide, 1 MG/DOSE, (OZEMPIC, 1 MG/DOSE,) 2 MG/1.5ML SOPN Inject 1 mg into the skin once a week.   simvastatin (ZOCOR) 40 MG tablet TAKE 1 TABLET BY MOUTH EVERY DAY   [DISCONTINUED] methocarbamol (ROBAXIN) 500 MG tablet Take 1 tablet (500 mg total) by mouth every 8 (eight) hours as needed for muscle spasms.   [DISCONTINUED] oxybutynin (DITROPAN-XL) 5 MG 24 hr tablet  Take 5 mg by mouth at bedtime.   [DISCONTINUED] traMADol (ULTRAM) 50 MG tablet Take by mouth every 8 (eight) hours as needed.   [DISCONTINUED] triamcinolone cream (KENALOG) 0.1 % Apply 1 Application topically 2 (two) times daily.   No facility-administered encounter medications on file as of 02/01/2022.    Review of Systems  Constitutional:  Negative for appetite change, chills, fatigue, fever and unexpected weight change.  Eyes:  Negative for pain, discharge, redness, itching  and visual disturbance.  Respiratory:  Negative for cough, chest tightness, shortness of breath and wheezing.   Cardiovascular:  Negative for chest pain, palpitations and leg swelling.  Gastrointestinal:  Negative for abdominal distention, abdominal pain, blood in stool, constipation, diarrhea, nausea and vomiting.  Endocrine: Negative for cold intolerance, heat intolerance, polydipsia, polyphagia and polyuria.  Genitourinary:  Negative for difficulty urinating, dysuria, flank pain, frequency and urgency.  Musculoskeletal:  Positive for arthralgias. Negative for back pain, gait problem, joint swelling, myalgias, neck pain and neck stiffness.       Right shoulder pain  States neck pain has improved with cortisol injection.   Skin:  Negative for color change, pallor, rash and wound.  Neurological:  Negative for dizziness, syncope, speech difficulty, weakness, light-headedness, numbness and headaches.  Hematological:  Does not bruise/bleed easily.  Psychiatric/Behavioral:  Negative for agitation, behavioral problems, confusion, hallucinations, self-injury, sleep disturbance and suicidal ideas. The patient is not nervous/anxious.     Immunization History  Administered Date(s) Administered   Fluad Quad(high Dose 65+) 10/05/2021   Influenza,inj,Quad PF,6+ Mos 10/17/2017, 09/30/2018   PFIZER(Purple Top)SARS-COV-2 Vaccination 01/20/2019, 02/09/2019, 11/20/2019   Pfizer Covid-19 Vaccine Bivalent Booster 38yrs & up 07/19/2020, 12/07/2020   Pneumococcal Polysaccharide-23 12/28/2018, 06/29/2020   Tdap 07/20/2014   Pertinent  Health Maintenance Due  Topic Date Due   PAP SMEAR-Modifier  01/16/2021   DEXA SCAN  Never done   FOOT EXAM  02/12/2022   HEMOGLOBIN A1C  04/05/2022   OPHTHALMOLOGY EXAM  10/05/2022   MAMMOGRAM  10/23/2023   COLONOSCOPY (Pts 45-48yrs Insurance coverage will need to be confirmed)  06/23/2028   INFLUENZA VACCINE  Completed      03/29/2019   10:48 AM 03/19/2021    8:37 AM  05/11/2021    8:50 AM 10/05/2021    9:19 AM 02/01/2022    8:51 AM  Fall Risk  Falls in the past year? 0 0 0 0 0  Was there an injury with Fall?  0 0 0 0  Fall Risk Category Calculator  0 0 0 0  Fall Risk Category  Low Low Low Low  Patient Fall Risk Level  Low fall risk Low fall risk Low fall risk Low fall risk  Patient at Risk for Falls Due to  No Fall Risks No Fall Risks No Fall Risks No Fall Risks  Fall risk Follow up  Falls evaluation completed Falls evaluation completed Falls evaluation completed Falls evaluation completed   Functional Status Survey:    Vitals:   02/01/22 0846  BP: 122/80  Pulse: 74  Resp: 16  Temp: (!) 97.3 F (36.3 C)  SpO2: 97%  Weight: 194 lb (88 kg)  Height: 5\' 5"  (1.651 m)   Body mass index is 32.28 kg/m. Physical Exam Vitals reviewed.  Constitutional:      General: She is not in acute distress.    Appearance: Normal appearance. She is obese. She is not ill-appearing or diaphoretic.  HENT:     Head: Normocephalic.  Neck:  Vascular: No carotid bruit.  Cardiovascular:     Rate and Rhythm: Normal rate and regular rhythm.     Pulses: Normal pulses.     Heart sounds: Normal heart sounds. No murmur heard.    No friction rub. No gallop.  Pulmonary:     Effort: Pulmonary effort is normal. No respiratory distress.     Breath sounds: Normal breath sounds. No wheezing, rhonchi or rales.  Chest:     Chest wall: No tenderness.  Musculoskeletal:        General: No swelling.     Right shoulder: Tenderness present. No swelling, deformity, effusion or crepitus. Decreased range of motion. Normal strength. Normal pulse.     Left shoulder: Normal.     Cervical back: Normal range of motion. No rigidity or tenderness.     Right lower leg: No edema.     Left lower leg: No edema.  Lymphadenopathy:     Cervical: No cervical adenopathy.  Skin:    General: Skin is warm and dry.     Coloration: Skin is not pale.     Findings: No bruising, erythema, lesion or  rash.     Comments: Scalp without any rash or dryness   Neurological:     Mental Status: She is alert and oriented to person, place, and time.     Gait: Gait normal.  Psychiatric:        Speech: Speech normal.    Labs reviewed: Recent Labs    03/19/21 0916 10/05/21 0947  NA 140 140  K 5.4* 4.7  CL 104 104  CO2 29 28  GLUCOSE 112* 98  BUN 16 21  CREATININE 0.97 0.92  CALCIUM 9.8 8.9   Recent Labs    03/19/21 0916 10/05/21 0947  AST 17 15  ALT 16 13  BILITOT 0.3 0.3  PROT 7.1 6.5   Recent Labs    03/19/21 0916 10/05/21 0947  WBC 7.0 6.3  NEUTROABS 3,885 3,232  HGB 10.1* 9.3*  HCT 33.0* 29.9*  MCV 84.6 84.2  PLT 323 282   Lab Results  Component Value Date   TSH 0.415 08/18/2009   Lab Results  Component Value Date   HGBA1C 5.9 (H) 10/05/2021   Lab Results  Component Value Date   CHOL 145 10/05/2021   HDL 55 10/05/2021   LDLCALC 75 10/05/2021   TRIG 71 10/05/2021   CHOLHDL 2.6 10/05/2021    Significant Diagnostic Results in last 30 days:  No results found.  Assessment/Plan   Chronic right shoulder pain Right shoulder tender to palpation with limited ROM MR A done 08/17/2020 showed Rotator cuff tendinopathy with a supraspinatus tendon tear. - continue current pain regimen  -refer to Orthopedic for further evaluation  - Ambulatory referral to Orthopedic Surgery  Family/ staff Communication: Reviewed plan of care with patient verbalized understanding   Labs/tests ordered: None   Next Appointment: Return if symptoms worsen or fail to improve.   Brittany Hughs, NP

## 2022-02-08 ENCOUNTER — Telehealth: Payer: Self-pay

## 2022-02-08 NOTE — Telephone Encounter (Signed)
Patient assistance, Ozempic received x 4 boxes.  Patient is aware and plans to pick up one day next week

## 2022-02-11 DIAGNOSIS — M25511 Pain in right shoulder: Secondary | ICD-10-CM | POA: Diagnosis not present

## 2022-02-23 ENCOUNTER — Other Ambulatory Visit: Payer: Self-pay | Admitting: Nurse Practitioner

## 2022-02-23 DIAGNOSIS — L309 Dermatitis, unspecified: Secondary | ICD-10-CM

## 2022-02-25 DIAGNOSIS — M25511 Pain in right shoulder: Secondary | ICD-10-CM | POA: Diagnosis not present

## 2022-03-04 DIAGNOSIS — M25511 Pain in right shoulder: Secondary | ICD-10-CM | POA: Diagnosis not present

## 2022-03-05 ENCOUNTER — Ambulatory Visit (INDEPENDENT_AMBULATORY_CARE_PROVIDER_SITE_OTHER): Payer: Medicare HMO | Admitting: Family

## 2022-03-05 ENCOUNTER — Encounter: Payer: Self-pay | Admitting: Family

## 2022-03-05 VITALS — BP 120/74 | HR 97 | Temp 97.8°F | Resp 18 | Ht 65.0 in | Wt 195.0 lb

## 2022-03-05 DIAGNOSIS — E1140 Type 2 diabetes mellitus with diabetic neuropathy, unspecified: Secondary | ICD-10-CM | POA: Diagnosis not present

## 2022-03-05 DIAGNOSIS — Z794 Long term (current) use of insulin: Secondary | ICD-10-CM

## 2022-03-05 DIAGNOSIS — Z01818 Encounter for other preprocedural examination: Secondary | ICD-10-CM | POA: Diagnosis not present

## 2022-03-05 DIAGNOSIS — E782 Mixed hyperlipidemia: Secondary | ICD-10-CM

## 2022-03-05 MED ORDER — GLIMEPIRIDE 2 MG PO TABS: 2.0000 mg | ORAL_TABLET | Freq: Every day | ORAL | 0 refills | Status: DC

## 2022-03-05 NOTE — Progress Notes (Signed)
Provider: Kyona Chauncey FNP-C  Lauree Chandler, NP  Patient Care Team: Lauree Chandler, NP as PCP - General (Geriatric Medicine) Marylynn Pearson, MD as Consulting Physician (Obstetrics and Gynecology) Warren Danes, PA-C as Physician Assistant (Dermatology)  Extended Emergency Contact Information Primary Emergency Contact: [place] Address: [phone]          [place],  [address] [place] [employer] Phone: [phone] Relation: Daughter Secondary Emergency Contact: [place] Mobile Phone: [phone] Relation: Daughter  Code Status:  Full Code  Goals of care: Advanced Directive information    02/01/2022    8:51 AM  Advanced Directives  Does Patient Have a Medical Advance Directive? No  Would patient like information on creating a medical advance directive? No - Patient declined     Chief Complaint  Patient presents with   Pre-op Exam    Patient is here for her pre-operative clearance    HPI:  Brittany Klein is a 67 y.o. female seen today for an acute visit for pre-op clearance for right shoulder Rotator cuff repair with Dr.Jefferey Carlton Beane.Rates pain 4/10 on scale states was started on Tramadol 50 mg tablet at bedtime.pain affects her ability to pull her pants up.  Not on anticoagulant but on ASA 81 mg tablet daily.Discussed with her to hold Asprin 7 days prior to surgery.  States CBG running low in the mornings in the 60's - 80's.States feels shaky in the morning and swimmy headed. Last Hgb A1C 5.9 ( 09/2021 ). On Amaryl 4 mg tablet,metformin 1000 twice daily and semaglutide 1 mg weekly.    Past Medical History:  Diagnosis Date   Anemia    Arthritis    "knees, hands" (08/04/2012)   Chronic lower back pain    "all the time" (08/04/2012)   Dysrhythmia    Tachycardia, comes and goes. Increases with activity   History of blood transfusion 01/21/2002   "related to knee replacement" (08/04/2012)   History of  blood transfusion    Hyperlipemia    Tachycardia    Type II diabetes mellitus [facility])    Past Surgical History:  Procedure Laterality Date   CARPAL TUNNEL RELEASE Left 09/21/1988   HERNIA REPAIR  [id number]   umbilical   TOTAL KNEE ARTHROPLASTY Left 10/22/2002   TOTAL KNEE REVISION Left 08/04/2012   TOTAL KNEE REVISION Left 08/04/2012   Procedure: LEFT TOTAL KNEE REVISION;  Surgeon: Meredith Pel, MD;  Location: [place];  Service: Orthopedics;  Laterality: Left;   TUBAL LIGATION  01/21/1978    Allergies  Allergen Reactions   Morphine And Related Itching    Outpatient Encounter Medications as of 03/05/2022  Medication Sig   aspirin EC 81 MG tablet Take 81 mg by mouth daily.   Blood Glucose Monitoring Suppl (ONETOUCH VERIO REFLECT) w/Device KIT 1 Device by Does not apply route in the morning and at bedtime.   glimepiride (AMARYL) 4 MG tablet TAKE 1 TABLET BY MOUTH DAILY WITH BREAKFAST   glucose blood (ONETOUCH VERIO) test strip Use as instructed   metFORMIN (GLUCOPHAGE) 1000 MG tablet Take 1 tablet (1,000 mg total) by mouth 2 (two) times daily with a meal.   Semaglutide, 1 MG/DOSE, (OZEMPIC, 1 MG/DOSE,) 2 MG/1.5ML SOPN Inject 1 mg into the skin once a week.   simvastatin (ZOCOR) 40 MG tablet TAKE 1 TABLET BY MOUTH EVERY DAY   [DISCONTINUED] triamcinolone cream (KENALOG) 0.1 % APPLY TO AFFECTED AREA TWICE A DAY  No facility-administered encounter medications on file as of 03/05/2022.    Review of Systems  Constitutional:  Negative for appetite change, chills, fatigue, fever and unexpected weight change.  HENT:  Negative for congestion, dental problem, ear discharge, ear pain, facial swelling, hearing loss, nosebleeds, postnasal drip, rhinorrhea, sinus pressure, sinus pain, sneezing, sore throat, tinnitus and trouble swallowing.   Eyes:  Negative for pain, discharge, redness, itching and visual disturbance.  Respiratory:  Negative for cough, chest tightness, shortness of breath  and wheezing.   Cardiovascular:  Negative for chest pain, palpitations and leg swelling.  Gastrointestinal:  Negative for abdominal distention, abdominal pain, blood in stool, constipation, diarrhea, nausea and vomiting.  Endocrine: Negative for cold intolerance, heat intolerance, polydipsia, polyphagia and polyuria.  Genitourinary:  Negative for difficulty urinating, dysuria, flank pain, frequency and urgency.  Musculoskeletal:  Positive for arthralgias. Negative for back pain, gait problem, joint swelling, myalgias, neck pain and neck stiffness.       Right shoulder pain   Skin:  Negative for color change, pallor, rash and wound.  Neurological:  Negative for dizziness, syncope, speech difficulty, weakness, light-headedness, numbness and headaches.  Hematological:  Does not bruise/bleed easily.  Psychiatric/Behavioral:  Negative for agitation, behavioral problems, confusion, hallucinations, self-injury, sleep disturbance and suicidal ideas. The patient is not nervous/anxious.     Immunization History  Administered Date(s) Administered   Fluad Quad(high Dose 65+) 10/05/2021   Influenza,inj,Quad PF,6+ Mos 10/17/2017, 09/30/2018   PFIZER(Purple Top)SARS-COV-2 Vaccination 01/20/2019, 02/09/2019, 11/20/2019   Pfizer Covid-19 Vaccine Bivalent Booster 72yr & up 07/19/2020, 12/07/2020   Pneumococcal Polysaccharide-23 12/28/2018, 06/29/2020   Tdap 07/20/2014   Pertinent  Health Maintenance Due  Topic Date Due   PAP SMEAR-Modifier  01/16/2021   DEXA SCAN  Never done   FOOT EXAM  02/12/2022   HEMOGLOBIN A1C  04/05/2022   OPHTHALMOLOGY EXAM  10/05/2022   MAMMOGRAM  10/23/2023   COLONOSCOPY (Pts 45-453yrInsurance coverage will need to be confirmed)  06/23/2028   INFLUENZA VACCINE  Completed      03/29/2019   10:48 AM 03/19/2021    8:37 AM 05/11/2021    8:50 AM 10/05/2021    9:19 AM 02/01/2022    8:51 AM  Fall Risk  Falls in the past year? 0 0 0 0 0  Was there an injury with Fall?  0 0 0 0   Fall Risk Category Calculator  0 0 0 0  Fall Risk Category (Retired)  Low Low Low Low  (RETIRED) Patient Fall Risk Level  Low fall risk Low fall risk Low fall risk Low fall risk  Patient at Risk for Falls Due to  No Fall Risks No Fall Risks No Fall Risks No Fall Risks  Fall risk Follow up  Falls evaluation completed Falls evaluation completed Falls evaluation completed Falls evaluation completed   Functional Status Survey:    Vitals:   03/05/22 1506  BP: 120/74  Pulse: 97  Resp: 18  Temp: 97.8 F (36.6 C)  SpO2: 99%  Weight: 195 lb (88.5 kg)  Height: 5' 5"$  (1.651 m)   Body mass index is 32.45 kg/m. Physical Exam Vitals reviewed.  Constitutional:      General: She is not in acute distress.    Appearance: Normal appearance. She is normal weight. She is not ill-appearing or diaphoretic.  HENT:     Head: Normocephalic.  Eyes:     General: No scleral icterus.       Right eye: No discharge.  Left eye: No discharge.     Conjunctiva/sclera: Conjunctivae normal.     Pupils: Pupils are equal, round, and reactive to light.  Neck:     Vascular: No carotid bruit.  Cardiovascular:     Rate and Rhythm: Normal rate and regular rhythm.     Pulses: Normal pulses.     Heart sounds: Normal heart sounds. No murmur heard.    No friction rub. No gallop.  Pulmonary:     Effort: Pulmonary effort is normal. No respiratory distress.     Breath sounds: Normal breath sounds. No wheezing, rhonchi or rales.  Chest:     Chest wall: No tenderness.  Abdominal:     General: Bowel sounds are normal. There is no distension.     Palpations: Abdomen is soft. There is no mass.     Tenderness: There is no abdominal tenderness. There is no right CVA tenderness, left CVA tenderness, guarding or rebound.  Musculoskeletal:        General: No swelling.     Right shoulder: Tenderness present. No swelling, deformity or effusion. Decreased range of motion. Normal strength. Normal pulse.     Left  shoulder: Normal.     Cervical back: Normal range of motion. No rigidity or tenderness.     Right lower leg: No edema.     Left lower leg: No edema.  Lymphadenopathy:     Cervical: No cervical adenopathy.  Skin:    General: Skin is warm and dry.     Coloration: Skin is not pale.     Findings: No bruising, erythema, lesion or rash.  Neurological:     Mental Status: She is alert and oriented to person, place, and time.     Cranial Nerves: No cranial nerve deficit.     Sensory: No sensory deficit.     Motor: No weakness.     Coordination: Coordination normal.     Gait: Gait normal.  Psychiatric:        Mood and Affect: Mood normal.        Speech: Speech normal.        Behavior: Behavior normal.     Labs reviewed: Recent Labs    03/19/21 0916 10/05/21 0947  NA 140 140  K 5.4* 4.7  CL 104 104  CO2 29 28  GLUCOSE 112* 98  BUN 16 21  CREATININE 0.97 0.92  CALCIUM 9.8 8.9   Recent Labs    03/19/21 0916 10/05/21 0947  AST 17 15  ALT 16 13  BILITOT 0.3 0.3  PROT 7.1 6.5   Recent Labs    03/19/21 0916 10/05/21 0947  WBC 7.0 6.3  NEUTROABS 3,885 3,232  HGB 10.1* 9.3*  HCT 33.0* 29.9*  MCV 84.6 84.2  PLT 323 282   Lab Results  Component Value Date   TSH 0.415 08/18/2009   Lab Results  Component Value Date   HGBA1C 5.9 (H) 10/05/2021   Lab Results  Component Value Date   CHOL 145 10/05/2021   HDL 55 10/05/2021   LDLCALC 75 10/05/2021   TRIG 71 10/05/2021   CHOLHDL 2.6 10/05/2021    Significant Diagnostic Results in last 30 days:  No results found.  Assessment/Plan  1. Pre-operative clearance Right rotator cuff repair with Dr.Jefferey Beane  Previous hgb low 9.3 ( 09/2021) will order labs today. - advised to Hold Asprin 81 mg tablet 7 days prior to surgery. - EKG 12-Lead indicates normal sinus Rhythm - COMPLETE METABOLIC PANEL WITH GFR -  CBC with Differential/Platelet - cleared for right rotator cuff repair from medical stand point.   2. Type 2  diabetes mellitus with diabetic neuropathy, with long-term current use of insulin (HCC) Lab Results  Component Value Date   HGBA1C 5.9 (H) 10/05/2021  Reports hypoglycemia readings in the 60's -80's  Has upcoming 6 months follow up with PCP but will go ahead and recheck Hgb A1C. - Advised to reduce Amaryl from 4 mg to 2 mg tablet then discontinue if blood sugar still running  < 75  - continue on metformin and semaglutide  - glimepiride (AMARYL) 2 MG tablet; Take 1 tablet (2 mg total) by mouth daily with breakfast.  Dispense: 30 tablet; Refill: 0 - TSH - COMPLETE METABOLIC PANEL WITH GFR - CBC with Differential/Platelet - Hemoglobin A1c  3. Mixed hyperlipidemia Previous LDL at goal  - continue dietary modification and exercise at [employer]  - continue on Simvastatin  - Lipid panel  Family/ staff Communication: Reviewed plan of care with patient verbalized understanding  Labs/tests ordered:  - CBC with Differential/Platelet - CMP with eGFR([facility]) - TSH - Hgb A1C - Lipid panel Next Appointment: Return in about 1 day (around 03/06/2022) for Fasting labs .   Sandrea Hughs, NP

## 2022-03-05 NOTE — Patient Instructions (Addendum)
-   Reduce Amaryl from 4 mg tablet to 2 mg tablet daily  Notify provider if blood sugars are less than 75   - Hold Asprin 81 mg tablet 7 days prior to right shoulder surgery. Rotator cuff repair  - Cleared from medical stand point for right

## 2022-03-06 ENCOUNTER — Other Ambulatory Visit: Payer: Medicare HMO

## 2022-03-06 DIAGNOSIS — E782 Mixed hyperlipidemia: Secondary | ICD-10-CM | POA: Diagnosis not present

## 2022-03-06 DIAGNOSIS — E1140 Type 2 diabetes mellitus with diabetic neuropathy, unspecified: Secondary | ICD-10-CM | POA: Diagnosis not present

## 2022-03-06 DIAGNOSIS — Z794 Long term (current) use of insulin: Secondary | ICD-10-CM | POA: Diagnosis not present

## 2022-03-06 DIAGNOSIS — Z01818 Encounter for other preprocedural examination: Secondary | ICD-10-CM | POA: Diagnosis not present

## 2022-03-07 LAB — CBC WITH DIFFERENTIAL/PLATELET
Absolute Monocytes: 355 cells/uL (ref 200–950)
Basophils Absolute: 27 cells/uL (ref 0–200)
Basophils Relative: 0.4 %
Eosinophils Absolute: 141 cells/uL (ref 15–500)
Eosinophils Relative: 2.1 %
HCT: 31.8 % — ABNORMAL LOW (ref 35.0–45.0)
Hemoglobin: 9.9 g/dL — ABNORMAL LOW (ref 11.7–15.5)
Lymphs Abs: 2901 cells/uL (ref 850–3900)
MCH: 26.3 pg — ABNORMAL LOW (ref 27.0–33.0)
MCHC: 31.1 g/dL — ABNORMAL LOW (ref 32.0–36.0)
MCV: 84.4 fL (ref 80.0–100.0)
MPV: 10.1 fL (ref 7.5–12.5)
Monocytes Relative: 5.3 %
Neutro Abs: 3276 cells/uL (ref 1500–7800)
Neutrophils Relative %: 48.9 %
Platelets: 300 10*3/uL (ref 140–400)
RBC: 3.77 10*6/uL — ABNORMAL LOW (ref 3.80–5.10)
RDW: 12.9 % (ref 11.0–15.0)
Total Lymphocyte: 43.3 %
WBC: 6.7 10*3/uL (ref 3.8–10.8)

## 2022-03-07 LAB — COMPLETE METABOLIC PANEL WITH GFR
AG Ratio: 1.2 (calc) (ref 1.0–2.5)
ALT: 15 U/L (ref 6–29)
AST: 17 U/L (ref 10–35)
Albumin: 3.8 g/dL (ref 3.6–5.1)
Alkaline phosphatase (APISO): 77 U/L (ref 37–153)
BUN: 24 mg/dL (ref 7–25)
CO2: 23 mmol/L (ref 20–32)
Calcium: 9.4 mg/dL (ref 8.6–10.4)
Chloride: 105 mmol/L (ref 98–110)
Creat: 0.99 mg/dL (ref 0.50–1.05)
Globulin: 3.1 g/dL (calc) (ref 1.9–3.7)
Glucose, Bld: 87 mg/dL (ref 65–99)
Potassium: 4.8 mmol/L (ref 3.5–5.3)
Sodium: 139 mmol/L (ref 135–146)
Total Bilirubin: 0.2 mg/dL (ref 0.2–1.2)
Total Protein: 6.9 g/dL (ref 6.1–8.1)
eGFR: 63 mL/min/{1.73_m2} (ref >=60)

## 2022-03-07 LAB — LIPID PANEL
Cholesterol: 160 mg/dL (ref <200)
HDL: 58 mg/dL (ref >=50)
LDL Cholesterol (Calc): 88 mg/dL (calc)
Non-HDL Cholesterol (Calc): 102 mg/dL (calc) (ref <130)
Total CHOL/HDL Ratio: 2.8 (calc) (ref <5.0)
Triglycerides: 59 mg/dL (ref <150)

## 2022-03-07 LAB — HEMOGLOBIN A1C
Hgb A1c MFr Bld: 6.4 % of total Hgb — ABNORMAL HIGH (ref <5.7)
Mean Plasma Glucose: 137 mg/dL
eAG (mmol/L): 7.6 mmol/L

## 2022-03-07 LAB — TSH: TSH: 1.08 mIU/L (ref 0.40–4.50)

## 2022-03-13 ENCOUNTER — Encounter: Payer: Self-pay | Admitting: Nurse Practitioner

## 2022-03-25 DIAGNOSIS — M7521 Bicipital tendinitis, right shoulder: Secondary | ICD-10-CM | POA: Diagnosis not present

## 2022-03-25 DIAGNOSIS — M659 Synovitis and tenosynovitis, unspecified: Secondary | ICD-10-CM | POA: Diagnosis not present

## 2022-03-25 DIAGNOSIS — S4381XA Sprain of other specified parts of right shoulder girdle, initial encounter: Secondary | ICD-10-CM | POA: Diagnosis not present

## 2022-03-25 DIAGNOSIS — M67813 Other specified disorders of tendon, right shoulder: Secondary | ICD-10-CM | POA: Diagnosis not present

## 2022-03-25 DIAGNOSIS — S46011A Strain of muscle(s) and tendon(s) of the rotator cuff of right shoulder, initial encounter: Secondary | ICD-10-CM | POA: Diagnosis not present

## 2022-03-25 DIAGNOSIS — M7551 Bursitis of right shoulder: Secondary | ICD-10-CM | POA: Diagnosis not present

## 2022-03-25 DIAGNOSIS — M7541 Impingement syndrome of right shoulder: Secondary | ICD-10-CM | POA: Diagnosis not present

## 2022-03-25 DIAGNOSIS — Y999 Unspecified external cause status: Secondary | ICD-10-CM | POA: Diagnosis not present

## 2022-03-25 DIAGNOSIS — X58XXXA Exposure to other specified factors, initial encounter: Secondary | ICD-10-CM | POA: Diagnosis not present

## 2022-03-25 DIAGNOSIS — M65811 Other synovitis and tenosynovitis, right shoulder: Secondary | ICD-10-CM | POA: Diagnosis not present

## 2022-03-25 DIAGNOSIS — S43431A Superior glenoid labrum lesion of right shoulder, initial encounter: Secondary | ICD-10-CM | POA: Diagnosis not present

## 2022-03-25 DIAGNOSIS — G8918 Other acute postprocedural pain: Secondary | ICD-10-CM | POA: Diagnosis not present

## 2022-03-25 HISTORY — PX: ROTATOR CUFF REPAIR: SHX139

## 2022-03-29 ENCOUNTER — Other Ambulatory Visit: Payer: Self-pay | Admitting: Family

## 2022-03-29 DIAGNOSIS — E1140 Type 2 diabetes mellitus with diabetic neuropathy, unspecified: Secondary | ICD-10-CM

## 2022-03-29 NOTE — Telephone Encounter (Signed)
Patient was only given 30 day supply of requested medication. Medication pend and sent to PCP Dewaine Oats Carlos American, NP for approval.

## 2022-04-01 ENCOUNTER — Encounter: Payer: Self-pay | Admitting: Nurse Practitioner

## 2022-04-05 ENCOUNTER — Ambulatory Visit (INDEPENDENT_AMBULATORY_CARE_PROVIDER_SITE_OTHER): Payer: Medicare HMO | Admitting: Nurse Practitioner

## 2022-04-05 ENCOUNTER — Encounter: Payer: Self-pay | Admitting: Nurse Practitioner

## 2022-04-05 VITALS — BP 118/72 | HR 83 | Temp 97.1°F | Ht 65.0 in | Wt 193.0 lb

## 2022-04-05 DIAGNOSIS — G8929 Other chronic pain: Secondary | ICD-10-CM | POA: Diagnosis not present

## 2022-04-05 DIAGNOSIS — E1140 Type 2 diabetes mellitus with diabetic neuropathy, unspecified: Secondary | ICD-10-CM

## 2022-04-05 DIAGNOSIS — D649 Anemia, unspecified: Secondary | ICD-10-CM

## 2022-04-05 DIAGNOSIS — Z794 Long term (current) use of insulin: Secondary | ICD-10-CM

## 2022-04-05 DIAGNOSIS — M25511 Pain in right shoulder: Secondary | ICD-10-CM

## 2022-04-05 DIAGNOSIS — E782 Mixed hyperlipidemia: Secondary | ICD-10-CM

## 2022-04-05 NOTE — Progress Notes (Signed)
Careteam: Patient Care Team: Lauree Chandler, NP as PCP - General (Geriatric Medicine) Marylynn Pearson, MD as Consulting Physician (Obstetrics and Gynecology) Warren Danes, PA-C as Physician Assistant (Dermatology)  PLACE OF SERVICE:  [place] Directive information Does Patient Have a Medical Advance Directive?: No, Would patient like information on creating a medical advance directive?: No - Patient declined  Allergies  Allergen Reactions   Morphine And Related Itching    Chief Complaint  Patient presents with   Medical Management of Chronic Issues    6 month follow-up and foot exam. Discuss need for shingrix, pap smear, and additional covid boosters or post pone if patient refuses or is not a candidate. NCIR verified. Patient denies receiving any vaccines since last visit. Patient would like to discuss deep itch in scalp area (long term)       HPI: Patient is a 67 y.o. female here for routine f/u.  She had surgery on the R shoulder- rotator cuff surgery with Dr. Tonita Cong 2 weeks ago. She is in recovery mode. She goes back Monday and is hoping to start physical therapy soon. [facility].   A1C was up to 6.4%, but patient was having symptomatic hypoglycemia in the mornings. Her amaryl was reduced to 1 tablet from 1.5 tablets due to this. She does not want to adjust her diabetes medications at the moment. She has not had any low blood sugars since reducing the amaryl dosage. She doesn't eat a lot of sweets or drink sodas.   Review of Systems:  Review of Systems  Constitutional:  Negative for chills, fever, malaise/fatigue and weight loss.  HENT:  Negative for congestion and sore throat.   Eyes:  Negative for blurred vision.  Respiratory:  Negative for cough, shortness of breath and wheezing.   Cardiovascular:  Negative for chest pain, palpitations and leg swelling.  Gastrointestinal:  Negative for  abdominal pain, blood in stool, constipation, diarrhea, heartburn, nausea and vomiting.  Genitourinary:  Negative for dysuria, frequency, hematuria and urgency.  Musculoskeletal:  Negative for falls and joint pain.  Skin:  Negative for rash.  Neurological:  Negative for dizziness, tingling and headaches.  Endo/Heme/Allergies:  Negative for polydipsia.  Psychiatric/Behavioral:  Negative for depression. The patient is not nervous/anxious.     Past Medical History:  Diagnosis Date   Anemia    Arthritis    "knees, hands" (08/04/2012)   Chronic lower back pain    "all the time" (08/04/2012)   Dysrhythmia    Tachycardia, comes and goes. Increases with activity   History of blood transfusion 01/21/2002   "related to knee replacement" (08/04/2012)   History of blood transfusion    Hyperlipemia    Tachycardia    Type II diabetes mellitus [facility])    Past Surgical History:  Procedure Laterality Date   CARPAL TUNNEL RELEASE Left 09/21/1988   HERNIA REPAIR  [id number]   umbilical   ROTATOR CUFF REPAIR Right 03/25/2022   TOTAL KNEE ARTHROPLASTY Left 10/22/2002   TOTAL KNEE REVISION Left 08/04/2012   TOTAL KNEE REVISION Left 08/04/2012   Procedure: LEFT TOTAL KNEE REVISION;  Surgeon: Meredith Pel, MD;  Location: [place];  Service: Orthopedics;  Laterality: Left;   TUBAL LIGATION  01/21/1978   Social History:   reports that she quit smoking about 23 years ago. Her smoking use included cigarettes. She has a 23.00 pack-year smoking history. She has never used  smokeless tobacco. She reports that she does not currently use alcohol. She reports that she does not use drugs.  Family History  Problem Relation Age of Onset   Diabetes Mother    Cancer Mother    Dementia Mother    Diabetes Father    Cancer Sister    Heart disease Sister    Diabetes Sister    Hypertension Sister    Stroke Sister    Diabetes Sister    Cancer Sister 42       lung   Diabetes Sister    Diabetes Sister     Diabetes Mellitus I Daughter     Medications: Patient's Medications  New Prescriptions   No medications on file  Previous Medications   ASPIRIN EC 81 MG TABLET    Take 81 mg by mouth daily.   BLOOD GLUCOSE MONITORING SUPPL (ONETOUCH VERIO REFLECT) W/DEVICE KIT    1 Device by Does not apply route in the morning and at bedtime.   GLIMEPIRIDE (AMARYL) 2 MG TABLET    Take 1 tablet (2 mg total) by mouth daily with breakfast.   GLUCOSE BLOOD (ONETOUCH VERIO) TEST STRIP    Use as instructed   METFORMIN (GLUCOPHAGE) 1000 MG TABLET    Take 1 tablet (1,000 mg total) by mouth 2 (two) times daily with a meal.   SEMAGLUTIDE, 1 MG/DOSE, (OZEMPIC, 1 MG/DOSE,) 2 MG/1.5ML SOPN    Inject 1 mg into the skin once a week.   SIMVASTATIN (ZOCOR) 40 MG TABLET    TAKE 1 TABLET BY MOUTH EVERY DAY  Modified Medications   No medications on file  Discontinued Medications   TRAMADOL (ULTRAM) 50 MG TABLET    Take 50 mg by mouth at bedtime as needed.    Physical Exam:  Vitals:   04/05/22 0910  BP: 118/72  Pulse: 83  Temp: (!) 97.1 F (36.2 C)  TempSrc: Temporal  SpO2: 99%  Weight: 193 lb (87.5 kg)  Height: 5\' 5"  (1.651 m)   Body mass index is 32.12 kg/m. Wt Readings from Last 3 Encounters:  04/05/22 193 lb (87.5 kg)  03/05/22 195 lb (88.5 kg)  02/01/22 194 lb (88 kg)    Physical Exam Vitals reviewed.  Constitutional:      General: She is not in acute distress.    Appearance: Normal appearance.  Cardiovascular:     Rate and Rhythm: Normal rate and regular rhythm.  Pulmonary:     Effort: No respiratory distress.     Breath sounds: Normal breath sounds.  Abdominal:     General: Bowel sounds are normal. There is no distension.     Palpations: Abdomen is soft. There is no mass.     Tenderness: There is no abdominal tenderness. There is no guarding.  Musculoskeletal:     Cervical back: Neck supple.     Comments: L shoulder sling  Lymphadenopathy:     Cervical: No cervical adenopathy.  Skin:     General: Skin is warm and dry.  Neurological:     Mental Status: She is alert and oriented to person, place, and time.  Psychiatric:        Mood and Affect: Mood normal.     Labs reviewed: Basic Metabolic Panel: Recent Labs    10/05/21 0947 03/06/22 0913  NA 140 139  K 4.7 4.8  CL 104 105  CO2 28 23  GLUCOSE 98 87  BUN 21 24  CREATININE 0.92 0.99  CALCIUM 8.9 9.4  TSH  --  1.08   Liver Function Tests: Recent Labs    10/05/21 0947 03/06/22 0913  AST 15 17  ALT 13 15  BILITOT 0.3 0.2  PROT 6.5 6.9   No results for input(s): "LIPASE", "AMYLASE" in the last 8760 hours. No results for input(s): "AMMONIA" in the last 8760 hours. CBC: Recent Labs    10/05/21 0947 03/06/22 0913  WBC 6.3 6.7  NEUTROABS 3,232 3,276  HGB 9.3* 9.9*  HCT 29.9* 31.8*  MCV 84.2 84.4  PLT 282 300   Lipid Panel: Recent Labs    10/05/21 0947 03/06/22 0913  CHOL 145 160  HDL 55 58  LDLCALC 75 88  TRIG 71 59  CHOLHDL 2.6 2.8   TSH: Recent Labs    03/06/22 0913  TSH 1.08   A1C: Lab Results  Component Value Date   HGBA1C 6.4 (H) 03/06/2022     Assessment/Plan 1. Type 2 diabetes mellitus with diabetic neuropathy, with long-term current use of insulin (HCC) Monitor A1C, recheck in 3 months. Consider discontinuing glimepiride if A1C stable. Continue ozempic, glimepiride, and metformin at this time. Increase physical activity as tolerated with recent shoulder surgery.Encouraged dietary compliance, routine foot care/monitoring and to keep up with diabetic eye exams through ophthalmology  - Hemoglobin A1c; Future  2. Mixed hyperlipidemia Continue simvastatin along with dietary and lifestyle modifications. Labs stable at last visit. - Lipid panel; Future - COMPLETE METABOLIC PANEL WITH GFR; Future  3. Chronic right shoulder pain Pt just completed rotator cuff surgery with Dr. Tonita Cong and is doing well. Pain is controlled. Following with ortho in 4 days.   4. Anemia, unspecified  type Pt denies signs of blood in urine or stool. Chronic anemia noted. Been worked up previously without significant findings. Denies shortness of breath, chest pain, fatigue currently. - CBC with Differential/Platelet; Future   Return in about 3 months (around 07/06/2022) for routine follow up, labs prior to appt .  Student- Archer Asa O'Berry ACPCNP-S  I personally was present during the history, physical exam and medical decision-making activities of this service and have verified that the service and findings are accurately documented in the student's note Lawrnce Reyez K. Carmine, [facility] [phone]

## 2022-04-17 ENCOUNTER — Encounter: Payer: Self-pay | Admitting: Dermatology

## 2022-04-17 ENCOUNTER — Ambulatory Visit: Payer: Medicare HMO | Admitting: Dermatology

## 2022-04-17 DIAGNOSIS — L219 Seborrheic dermatitis, unspecified: Secondary | ICD-10-CM | POA: Diagnosis not present

## 2022-04-17 DIAGNOSIS — L299 Pruritus, unspecified: Secondary | ICD-10-CM

## 2022-04-17 MED ORDER — FLUOCINOLONE ACETONIDE BODY 0.01 % EX OIL: 1.0000 | TOPICAL_OIL | CUTANEOUS | 11 refills | Status: DC

## 2022-04-17 MED ORDER — FLUCONAZOLE 150 MG PO TABS: 150.0000 mg | ORAL_TABLET | Freq: Every day | ORAL | 2 refills | Status: DC

## 2022-04-17 NOTE — Patient Instructions (Addendum)
Due to recent changes in healthcare laws, you may see results of your pathology and/or laboratory studies on MyChart before the doctors have had a chance to review them. We understand that in some cases there may be results that are confusing or concerning to you. Please understand that not all results are received at the same time and often the doctors may need to interpret multiple results in order to provide you with the best plan of care or course of treatment. Therefore, we ask that you please give [place] 2 business days to thoroughly review all your results before contacting the office for clarification. Should we see a critical lab result, you will be contacted sooner.   If You Need Anything After Your Visit  If you have any questions or concerns for your doctor, please call our main line at [phone] If no one answers, please leave a voicemail as directed and we will return your call as soon as possible. Messages left after 4 pm will be answered the following business day.   You may also send [place] a message via [place]. We typically respond to MyChart messages within 1-2 business days.  For prescription refills, please ask your pharmacy to contact our office. Our fax number is [phone].  If you have an urgent issue when the clinic is closed that cannot wait until the next business day, you can page your doctor at the number below.    Please note that while we do our best to be available for urgent issues outside of office hours, we are not available 24/7.   If you have an urgent issue and are unable to reach [place], you may choose to seek medical care at your doctor's office, retail clinic, urgent care center, or emergency room.  If you have a medical emergency, please immediately call 911 or go to the emergency department. In the event of inclement weather, please call our main line at [phone] for an update on the status of any delays or closures.  Dermatology Medication  Tips: Please keep the boxes that topical medications come in in order to help keep track of the instructions about where and how to use these. Pharmacies typically print the medication instructions only on the boxes and not directly on the medication tubes.   If your medication is too expensive, please contact our office at [phone] or send [place] a message through [place].   We are unable to tell what your co-pay for medications will be in advance as this is different depending on your insurance coverage. However, we may be able to find a substitute medication at lower cost or fill out paperwork to get insurance to cover a needed medication.   If a prior authorization is required to get your medication covered by your insurance company, please allow [place] 1-2 business days to complete this process.  Drug prices often vary depending on where the prescription is filled and some pharmacies may offer cheaper prices.  The website www.goodrx.com contains coupons for medications through different pharmacies. The prices here do not account for what the cost may be with help from insurance (it may be cheaper with your insurance), but the website can give you the price if you did not use any insurance.  - You can print the associated coupon and take it with your prescription to the pharmacy.  - You may also stop by our office during regular business hours and pick up a GoodRx coupon card.  - If you need  your prescription sent electronically to a different pharmacy, notify our office through [facility] or by phone at [phone]    Instructions for scalp  Apply Derma Smoothe to scalp let sit over night or three hours then wash out with DHS Zinc. Apply as needed for itch between shampoos

## 2022-04-17 NOTE — Progress Notes (Signed)
   New Patient Visit  Subjective  Brittany Klein is a 67 y.o. female who presents for the following: Itchy Scalp (Patient is here for a deep itch in the scalp on the crown. Dermatologist at [facility] prescribed clobetasol solution. It soothed the scalp but once it tried it itched again. Washes hair and conditions every two weeks. Spots in axilla area. ).    Objective  Well appearing patient in no apparent distress; mood and affect are within normal limits.  A focused examination was performed including scalp and axilla. Relevant physical exam findings are noted in the Assessment and Plan.  Scalp Scaly itchy scalp  Scalp Pt exhibits itching of the scalps   Assessment & Plan  Seborrheic dermatitis Scalp  Plan: Counseling This is a chronic condition that we can treat but not cure I counseled the patient regarding the following: Skin care: Emollients, shampoos with tar, selenium or zinc pyrithione can improve seborrheic dermatitis. Expectations: Seborrheic Dermatitis is chronic in nature with periods of remissions and flares. Flares can be triggered by stress. Contact office if: Seborrheic dermatitis worsens, or fails to improve despite several months of treatment.  I recommended the following: Topical Steroids  The following medication counseling was provided: I discussed with the patient that prolonged use of topical steroids can result in the increased appearance of superficial blood vessels (telangiectasias), lightening (hypopigmentation) and thinning of the skin (atrophy). Patient understands to avoid using high potency steroids in skin folds, the groin or the face. The patient verbalized understanding of the proper use and possible adverse effects of topical steroids. All of the patient's questions and concerns were addressed.   fluconazole (DIFLUCAN) 150 MG tablet - Scalp Take 1 tablet (150 mg total) by mouth daily. Take one 150mg  tablet by mouth  Fluocinolone Acetonide Body  0.01 % OIL - Scalp Apply 1 Application topically once a week. Apply to scalp let sit over night or three hours then wash out with DHS Zinc. Apply as needed for itch between shampoos  Pruritus Scalp  I counseled the patient regarding the following: Skin care: Recommend antiinflammationry zinc based shampoo and topical corticosteroids Expectations: Pruritus can be intermittent or persistent. Contact office if: Pruritus is accompanied by constitutional symptoms, or persists despite several months of treatment.   Treatment Plan: Dermasmooth Fs-Oil rx was sent with [facility] diagnosis accidentally.    Return in about 4 months (around 08/17/2022) for Scalp follow up.  I, Zigmund Gottron, CMA, am acting as scribe for Ellard Artis, MD.  Documentation: I have reviewed the above documentation for accuracy and completeness, and I agree with the above  [place], DO

## 2022-04-18 ENCOUNTER — Ambulatory Visit: Payer: Medicare HMO | Admitting: Dermatology

## 2022-04-23 DIAGNOSIS — M25511 Pain in right shoulder: Secondary | ICD-10-CM | POA: Diagnosis not present

## 2022-04-24 ENCOUNTER — Ambulatory Visit
Admission: RE | Admit: 2022-04-24 | Discharge: 2022-04-24 | Disposition: A | Discharge status: Home / Self Care | Payer: Medicare HMO | Source: Ambulatory Visit | Attending: Obstetrics and Gynecology | Admitting: Obstetrics and Gynecology

## 2022-04-24 DIAGNOSIS — R921 Mammographic calcification found on diagnostic imaging of breast: Secondary | ICD-10-CM | POA: Diagnosis not present

## 2022-04-29 ENCOUNTER — Encounter: Payer: Self-pay | Admitting: Nurse Practitioner

## 2022-04-29 ENCOUNTER — Ambulatory Visit (INDEPENDENT_AMBULATORY_CARE_PROVIDER_SITE_OTHER): Payer: Medicare HMO | Admitting: Nurse Practitioner

## 2022-04-29 VITALS — BP 114/60 | HR 93 | Temp 97.9°F | Resp 16 | Ht 64.57 in | Wt 194.4 lb

## 2022-04-29 DIAGNOSIS — Z23 Encounter for immunization: Secondary | ICD-10-CM

## 2022-04-29 DIAGNOSIS — Z794 Long term (current) use of insulin: Secondary | ICD-10-CM

## 2022-04-29 DIAGNOSIS — E2839 Other primary ovarian failure: Secondary | ICD-10-CM

## 2022-04-29 DIAGNOSIS — Z0000 Encounter for general adult medical examination without abnormal findings: Secondary | ICD-10-CM

## 2022-04-29 DIAGNOSIS — E1140 Type 2 diabetes mellitus with diabetic neuropathy, unspecified: Secondary | ICD-10-CM | POA: Diagnosis not present

## 2022-04-29 NOTE — Progress Notes (Signed)
Subjective:   Brittany Klein is a 67 y.o. female who presents for Medicare Annual (Subsequent) preventive examination.  Review of Systems     Cardiac Risk Factors include: advanced age (>58men, >69 women);diabetes mellitus;dyslipidemia;sedentary lifestyle     Objective:    Today's Vitals   04/29/22 1058 04/29/22 1114  BP: 114/60   Pulse: 93   Resp: 16   Temp: 97.9 F (36.6 C)   TempSrc: Temporal   SpO2: 99%   Weight: 194 lb 6.4 oz (88.2 kg)   Height: 5' 4.57" (1.64 m)   PainSc:  4    Body mass index is 32.79 kg/m.     04/29/2022   11:00 AM 04/05/2022    9:13 AM 02/01/2022    8:51 AM 10/05/2021    9:20 AM 05/11/2021    8:50 AM 03/19/2021    8:37 AM 09/28/2020    8:04 AM  Advanced Directives  Does Patient Have a Medical Advance Directive? No No No No No No No  Would patient like information on creating a medical advance directive? No - Patient declined No - Patient declined No - Patient declined Yes (MAU/Ambulatory/Procedural Areas - Information given) No - Patient declined Yes (MAU/Ambulatory/Procedural Areas - Information given) No - Patient declined    Current Medications (verified) Outpatient Encounter Medications as of 04/29/2022  Medication Sig   aspirin EC 81 MG tablet Take 81 mg by mouth daily.   Blood Glucose Monitoring Suppl (ONETOUCH VERIO REFLECT) w/Device KIT 1 Device by Does not apply route in the morning and at bedtime.   fluconazole (DIFLUCAN) 150 MG tablet Take 1 tablet (150 mg total) by mouth daily. Take one 150mg  tablet by mouth   Fluocinolone Acetonide Body 0.01 % OIL Apply 1 Application topically once a week. Apply to scalp let sit over night or three hours then wash out with DHS Zinc. Apply as needed for itch between shampoos   glimepiride (AMARYL) 2 MG tablet Take 1 tablet (2 mg total) by mouth daily with breakfast.   glucose blood (ONETOUCH VERIO) test strip Use as instructed   metFORMIN (GLUCOPHAGE) 1000 MG tablet Take 1 tablet (1,000 mg total) by mouth  2 (two) times daily with a meal.   Semaglutide, 1 MG/DOSE, (OZEMPIC, 1 MG/DOSE,) 2 MG/1.5ML SOPN Inject 1 mg into the skin once a week.   simvastatin (ZOCOR) 40 MG tablet TAKE 1 TABLET BY MOUTH EVERY DAY   No facility-administered encounter medications on file as of 04/29/2022.    Allergies (verified) Morphine and related   History: Past Medical History:  Diagnosis Date   Anemia    Arthritis    "knees, hands" (08/04/2012)   Chronic lower back pain    "all the time" (08/04/2012)   Dysrhythmia    Tachycardia, comes and goes. Increases with activity   History of blood transfusion 01/21/2002   "related to knee replacement" (08/04/2012)   History of blood transfusion    Hyperlipemia    Tachycardia    Type II diabetes mellitus    Past Surgical History:  Procedure Laterality Date   CARPAL TUNNEL RELEASE Left 09/21/1988   HERNIA REPAIR  01/21/1998   umbilical   ROTATOR CUFF REPAIR Right 03/25/2022   TOTAL KNEE ARTHROPLASTY Left 10/22/2002   TOTAL KNEE REVISION Left 08/04/2012   TOTAL KNEE REVISION Left 08/04/2012   Procedure: LEFT TOTAL KNEE REVISION;  Surgeon: Cammy Copa, MD;  Location: [facility] OR;  Service: Orthopedics;  Laterality: Left;   TUBAL LIGATION  01/21/1978  Family History  Problem Relation Age of Onset   Diabetes Mother    Cancer Mother    Dementia Mother    Diabetes Father    Cancer Sister    Heart disease Sister    Diabetes Sister    Hypertension Sister    Stroke Sister    Diabetes Sister    Cancer Sister 70       lung   Diabetes Sister    Diabetes Sister    Diabetes Mellitus I Daughter    Social History   Socioeconomic History   Marital status: Single    Spouse name: Not on file   Number of children: Not on file   Years of education: Not on file   Highest education level: Not on file  Occupational History   Not on file  Tobacco Use   Smoking status: Former    Packs/day: 1.00    Years: 23.00    Additional pack years: 0.00    Total pack  years: 23.00    Types: Cigarettes    Quit date: 12/01/1998    Years since quitting: 23.4   Smokeless tobacco: Never  Vaping Use   Vaping Use: Never used  Substance and Sexual Activity   Alcohol use: Not Currently    Comment: 08/05/2012 "don't drink anymore; stopped ~ 11/1998; never had problem w/it"   Drug use: No   Sexual activity: Not on file  Other Topics Concern   Not on file  Social History Narrative   Tobacco use, amount per day now:   Past tobacco use, amount per day: 1 pack a day 40 years ago.   How many years did you use tobacco:   Alcohol use (drinks per week):   Diet:   Do you drink/eat things with caffeine: Coffee 1 cup a day.   Marital status:  Single                                What year were you married?   Do you live in a house, apartment, assisted living, condo, trailer, etc.? Apartment    Is it one or more stories? 15 stairs.   How many persons live in your home? 2   Do you have pets in your home?( please list)  None.   Highest Level of education completed? 12 grade.   Current or past profession: Personnel officer.   Do you exercise?  Monday-Friday                                Type and how often? [employer] 45 minutes.   Do you have a living will? No   Do you have a DNR form?    No                               If not, do you want to discuss one?   Do you have signed POA/HPOA forms?   No                     If so, please bring to you appointment      Do you have any difficulty bathing or dressing yourself? No   Do you have any difficulty preparing food or eating? No   Do you have any difficulty managing your medications?  No   Do you have any difficulty managing your finances? No   Do you have any difficulty affording your medications? No   Social Determinants of Corporate investment bankerHealth   Financial Resource Strain: Not on file  Food Insecurity: Not on file  Transportation Needs: Not on file  Physical Activity: Not on file  Stress: Not on file  Social Connections: Not  on file    Tobacco Counseling Counseling given: Not Answered   Clinical Intake:  Pre-visit preparation completed: Yes  Pain : 0-10 Pain Score: 4  Pain Type: Chronic pain Pain Location: Shoulder Pain Orientation: Right Pain Frequency: Constant     BMI - recorded: 32 Nutritional Status: BMI > 30  Obese Diabetes: Yes  How often do you need to have someone help you when you read instructions, pamphlets, or other written materials from your doctor or pharmacy?: 1 - Never  Diabetic?yes         Activities of Daily Living    04/29/2022   11:01 AM  In your present state of health, do you have any difficulty performing the following activities:  Hearing? 0  Vision? 0  Difficulty concentrating or making decisions? 0  Walking or climbing stairs? 0  Dressing or bathing? 0  Doing errands, shopping? 0  Preparing Food and eating ? N  Using the Toilet? N  In the past six months, have you accidently leaked urine? N  Do you have problems with loss of bowel control? N  Managing your Medications? N  Managing your Finances? N  Housekeeping or managing your Housekeeping? N    Patient Care Team: Sharon SellerEubanks, Blayden Conwell K, NP as PCP - General (Geriatric Medicine) Zelphia CairoAdkins, Gretchen, MD as Consulting Physician (Obstetrics and Gynecology) Glyn AdeSheffield, Kelli R, PA-C as Physician Assistant (Dermatology)  Indicate any recent Medical Services you may have received from other than [facility] providers in the past year (date may be approximate).     Assessment:   This is a routine wellness examination for LaffertyLinda.  Hearing/Vision screen No results found.  Dietary issues and exercise activities discussed: Current Exercise Habits: Structured exercise class, Type of exercise: strength training/weights;yoga, Exercise limited by: orthopedic condition(s)   Goals Addressed   None    Depression Screen    04/29/2022   11:01 AM 04/29/2022   11:00 AM 04/05/2022    9:13 AM 10/05/2021    9:19 AM 03/19/2021     8:37 AM 03/29/2019   10:48 AM 12/28/2018   11:06 AM  PHQ 2/9 Scores  PHQ - 2 Score 0 0 0 0 0 0 0  PHQ- 9 Score 0          Fall Risk    04/29/2022   11:00 AM 04/05/2022    9:13 AM 02/01/2022    8:51 AM 10/05/2021    9:19 AM 05/11/2021    8:50 AM  Fall Risk   Falls in the past year? 0 0 0 0 0  Number falls in past yr: 0 0 0 0 0  Injury with Fall? 0 0 0 0 0  Risk for fall due to :  No Fall Risks No Fall Risks No Fall Risks No Fall Risks  Follow up  Falls evaluation completed Falls evaluation completed Falls evaluation completed Falls evaluation completed    FALL RISK PREVENTION PERTAINING TO THE HOME:  Any stairs in or around the home? yes If so, are there any without handrails? No  Home free of loose throw rugs in walkways, pet beds, electrical cords, etc?  Yes  Adequate lighting in your home to reduce risk of falls? Yes   ASSISTIVE DEVICES UTILIZED TO PREVENT FALLS:  Life alert? No  Use of a cane, walker or w/c? No  Grab bars in the bathroom? No  Shower chair or bench in shower? No  Elevated toilet seat or a handicapped toilet? No   TIMED UP AND GO:  Was the test performed? No .    Cognitive Function:    04/29/2022   11:02 AM  MMSE - Mini Mental State Exam  Orientation to time 4  Orientation to Place 5  Registration 3  Attention/ Calculation 5  Recall 2  Language- name 2 objects 2  Language- repeat 1  Language- follow 3 step command 3  Language- read & follow direction 1  Write a sentence 1  Copy design 1  Total score 28        Immunizations Immunization History  Administered Date(s) Administered   Covid-19, Mrna,Vaccine(Spikevax)99yrs and older 11/02/2021   Fluad Quad(high Dose 65+) 10/05/2021   Influenza,inj,Quad PF,6+ Mos 10/17/2017, 09/30/2018   PFIZER(Purple Top)SARS-COV-2 Vaccination 01/20/2019, 02/09/2019, 11/20/2019   Pfizer Covid-19 Vaccine Bivalent Booster 24yrs & up 07/19/2020, 12/07/2020   Pneumococcal Polysaccharide-23 12/28/2018, 06/29/2020    Tdap 07/20/2014    TDAP status: Up to date  Flu Vaccine status: Up to date  Pneumococcal vaccine status: Completed during today's visit.  Covid-19 vaccine status: Information provided on how to obtain vaccines.   Qualifies for Shingles Vaccine? Yes   Zostavax completed No   Shingrix Completed?: No.    Education has been provided regarding the importance of this vaccine. Patient has been advised to call insurance company to determine out of pocket expense if they have not yet received this vaccine. Advised may also receive vaccine at local pharmacy or Health Dept. Verbalized acceptance and understanding.  Screening Tests Health Maintenance  Topic Date Due   DEXA SCAN  Never done   Diabetic kidney evaluation - Urine ACR  05/12/2022   Zoster Vaccines- Shingrix (1 of 2) 07/29/2022 (Originally 08/25/2006)   Pneumonia Vaccine 74+ Years old (2 of 2 - PCV) 10/05/2025 (Originally 08/24/2021)   INFLUENZA VACCINE  08/22/2022   HEMOGLOBIN A1C  09/04/2022   OPHTHALMOLOGY EXAM  10/05/2022   Diabetic kidney evaluation - eGFR measurement  03/07/2023   FOOT EXAM  04/05/2023   Medicare Annual Wellness (AWV)  04/29/2023   MAMMOGRAM  10/23/2023   PAP SMEAR-Modifier  05/11/2024   DTaP/Tdap/Td (2 - Td or Tdap) 07/19/2024   COLONOSCOPY (Pts 45-90yrs Insurance coverage will need to be confirmed)  06/23/2028   COVID-19 Vaccine  Completed   Hepatitis C Screening  Completed   HIV Screening  Completed   HPV VACCINES  Aged Out    Health Maintenance  Health Maintenance Due  Topic Date Due   DEXA SCAN  Never done   Diabetic kidney evaluation - Urine ACR  05/12/2022    Colorectal cancer screening: Type of screening: Colonoscopy. Completed 2020. Repeat every 10 years  Mammogram status: Completed 10/23/2021. Repeat every year  Bone Density status: Ordered today. Pt provided with contact info and advised to call to schedule appt.  Lung Cancer Screening: (Low Dose CT Chest recommended if Age 92-80 years,  30 pack-year currently smoking OR have quit w/in 15years.) does not qualify.   Lung Cancer Screening Referral: na  Additional Screening:  Hepatitis C Screening: does qualify; Completed 2020  Vision Screening: Recommended annual ophthalmology exams for early detection of glaucoma and other disorders  of the eye. Is the patient up to date with their annual eye exam?  Yes  Who is the provider or what is the name of the office in which the patient attends annual eye exams? [facility] If pt is not established with a provider, would they like to be referred to a provider to establish care? No .   Dental Screening: Recommended annual dental exams for proper oral hygiene  Community Resource Referral / Chronic Care Management: CRR required this visit?  No   CCM required this visit?  No      Plan:     I have personally reviewed and noted the following in the patient's chart:   Medical and social history Use of alcohol, tobacco or illicit drugs  Current medications and supplements including opioid prescriptions. Patient is not currently taking opioid prescriptions. Functional ability and status Nutritional status Physical activity Advanced directives List of other physicians Hospitalizations, surgeries, and ER visits in previous 12 months Vitals Screenings to include cognitive, depression, and falls Referrals and appointments  In addition, I have reviewed and discussed with patient certain preventive protocols, quality metrics, and best practice recommendations. A written personalized care plan for preventive services as well as general preventive health recommendations were provided to patient.     [name], NP   04/29/2022   Place of service: [facility]

## 2022-04-29 NOTE — Patient Instructions (Signed)
  Brittany Klein , Thank you for taking time to come for your Medicare Wellness Visit. I appreciate your ongoing commitment to your health goals. Please review the following plan we discussed and let me know if I can assist you in the future.   These are the goals we discussed:  Goals   None     This is a list of the screening recommended for you and due dates:  Health Maintenance  Topic Date Due   DEXA scan (bone density measurement)  Never done   Yearly kidney health urinalysis for diabetes  05/12/2022   Zoster (Shingles) Vaccine (1 of 2) 07/29/2022*   Pneumonia Vaccine (2 of 2 - PCV) 10/05/2025*   Flu Shot  08/22/2022   Hemoglobin A1C  09/04/2022   Eye exam for diabetics  10/05/2022   Yearly kidney function blood test for diabetes  03/07/2023   Complete foot exam   04/05/2023   Medicare Annual Wellness Visit  04/29/2023   Mammogram  10/23/2023   Pap Smear  05/11/2024   DTaP/Tdap/Td vaccine (2 - Td or Tdap) 07/19/2024   Colon Cancer Screening  06/23/2028   COVID-19 Vaccine  Completed   Hepatitis C Screening: USPSTF Recommendation to screen - Ages 18-79 yo.  Completed   HIV Screening  Completed   HPV Vaccine  Aged Out  *Topic was postponed. The date shown is not the original due date.

## 2022-04-30 LAB — MICROALBUMIN / CREATININE URINE RATIO
Creatinine, Urine: 158 mg/dL (ref 20–275)
Microalb Creat Ratio: 5 mg/g creat (ref <30)
Microalb, Ur: 0.8 mg/dL

## 2022-05-01 DIAGNOSIS — M25511 Pain in right shoulder: Secondary | ICD-10-CM | POA: Diagnosis not present

## 2022-05-03 ENCOUNTER — Encounter: Payer: Self-pay | Admitting: Nurse Practitioner

## 2022-05-03 DIAGNOSIS — M25511 Pain in right shoulder: Secondary | ICD-10-CM | POA: Diagnosis not present

## 2022-05-06 ENCOUNTER — Encounter: Payer: Self-pay | Admitting: Nurse Practitioner

## 2022-05-08 DIAGNOSIS — M25511 Pain in right shoulder: Secondary | ICD-10-CM | POA: Diagnosis not present

## 2022-05-10 DIAGNOSIS — M25511 Pain in right shoulder: Secondary | ICD-10-CM | POA: Diagnosis not present

## 2022-05-14 ENCOUNTER — Other Ambulatory Visit: Payer: Self-pay | Admitting: Family

## 2022-05-14 DIAGNOSIS — Z794 Long term (current) use of insulin: Secondary | ICD-10-CM

## 2022-05-15 ENCOUNTER — Other Ambulatory Visit: Payer: Self-pay | Admitting: Obstetrics and Gynecology

## 2022-05-15 DIAGNOSIS — M25511 Pain in right shoulder: Secondary | ICD-10-CM | POA: Diagnosis not present

## 2022-05-15 DIAGNOSIS — R921 Mammographic calcification found on diagnostic imaging of breast: Secondary | ICD-10-CM

## 2022-05-16 DIAGNOSIS — H524 Presbyopia: Secondary | ICD-10-CM | POA: Diagnosis not present

## 2022-05-16 DIAGNOSIS — H40013 Open angle with borderline findings, low risk, bilateral: Secondary | ICD-10-CM | POA: Diagnosis not present

## 2022-05-16 DIAGNOSIS — H2513 Age-related nuclear cataract, bilateral: Secondary | ICD-10-CM | POA: Diagnosis not present

## 2022-05-16 DIAGNOSIS — H5203 Hypermetropia, bilateral: Secondary | ICD-10-CM | POA: Diagnosis not present

## 2022-05-16 DIAGNOSIS — E113292 Type 2 diabetes mellitus with mild nonproliferative diabetic retinopathy without macular edema, left eye: Secondary | ICD-10-CM | POA: Diagnosis not present

## 2022-05-16 LAB — HM DIABETES EYE EXAM

## 2022-05-17 DIAGNOSIS — M25511 Pain in right shoulder: Secondary | ICD-10-CM | POA: Diagnosis not present

## 2022-05-22 DIAGNOSIS — M25511 Pain in right shoulder: Secondary | ICD-10-CM | POA: Diagnosis not present

## 2022-05-24 DIAGNOSIS — M25511 Pain in right shoulder: Secondary | ICD-10-CM | POA: Diagnosis not present

## 2022-05-28 ENCOUNTER — Telehealth: Payer: Self-pay | Admitting: Nurse Practitioner

## 2022-05-28 DIAGNOSIS — M25511 Pain in right shoulder: Secondary | ICD-10-CM | POA: Diagnosis not present

## 2022-05-28 NOTE — Telephone Encounter (Signed)
Okay to cancel order/referral

## 2022-05-28 NOTE — Telephone Encounter (Signed)
Pt states last dexa was done 09/2021 and she is not due until 09/2023. Ok to cancel the order?

## 2022-05-28 NOTE — Telephone Encounter (Signed)
Forwarded to Jessica 

## 2022-05-29 NOTE — Telephone Encounter (Signed)
Order cancelled

## 2022-05-31 DIAGNOSIS — M25511 Pain in right shoulder: Secondary | ICD-10-CM | POA: Diagnosis not present

## 2022-06-06 ENCOUNTER — Other Ambulatory Visit: Payer: Self-pay | Admitting: Family

## 2022-06-06 DIAGNOSIS — Z794 Long term (current) use of insulin: Secondary | ICD-10-CM

## 2022-06-11 ENCOUNTER — Telehealth: Payer: Self-pay

## 2022-06-11 NOTE — Telephone Encounter (Signed)
Patient is aware of Ozempic ready to be pick up.  Location in the fridge look in the sample closet.

## 2022-06-21 ENCOUNTER — Other Ambulatory Visit: Payer: Self-pay

## 2022-06-21 DIAGNOSIS — E1140 Type 2 diabetes mellitus with diabetic neuropathy, unspecified: Secondary | ICD-10-CM

## 2022-06-21 DIAGNOSIS — E782 Mixed hyperlipidemia: Secondary | ICD-10-CM

## 2022-06-21 DIAGNOSIS — D649 Anemia, unspecified: Secondary | ICD-10-CM

## 2022-06-21 DIAGNOSIS — E2839 Other primary ovarian failure: Secondary | ICD-10-CM

## 2022-07-02 ENCOUNTER — Other Ambulatory Visit: Payer: Medicare HMO

## 2022-07-05 ENCOUNTER — Encounter: Payer: Self-pay | Admitting: Nurse Practitioner

## 2022-07-05 ENCOUNTER — Ambulatory Visit (INDEPENDENT_AMBULATORY_CARE_PROVIDER_SITE_OTHER): Payer: Medicare HMO | Admitting: Nurse Practitioner

## 2022-07-05 VITALS — BP 132/70 | HR 74 | Temp 97.4°F | Resp 16 | Ht 64.57 in | Wt 194.4 lb

## 2022-07-05 DIAGNOSIS — E1140 Type 2 diabetes mellitus with diabetic neuropathy, unspecified: Secondary | ICD-10-CM | POA: Diagnosis not present

## 2022-07-05 DIAGNOSIS — E6609 Other obesity due to excess calories: Secondary | ICD-10-CM | POA: Diagnosis not present

## 2022-07-05 DIAGNOSIS — Z6832 Body mass index (BMI) 32.0-32.9, adult: Secondary | ICD-10-CM | POA: Diagnosis not present

## 2022-07-05 DIAGNOSIS — D649 Anemia, unspecified: Secondary | ICD-10-CM | POA: Diagnosis not present

## 2022-07-05 DIAGNOSIS — E782 Mixed hyperlipidemia: Secondary | ICD-10-CM | POA: Diagnosis not present

## 2022-07-05 DIAGNOSIS — Z794 Long term (current) use of insulin: Secondary | ICD-10-CM

## 2022-07-05 NOTE — Progress Notes (Signed)
Careteam: Patient Care Team: [name], NP as PCP - General (Geriatric Medicine) Zelphia Cairo, MD as Consulting Physician (Obstetrics and Gynecology) Glyn Ade, PA-C as Physician Assistant (Dermatology)  PLACE OF SERVICE:  [facility] CLINIC  Advanced Directive information Does Patient Have a Medical Advance Directive?: No, Would patient like information on creating a medical advance directive?: No - Patient declined  Allergies  Allergen Reactions   Morphine And Codeine Itching    Chief Complaint  Patient presents with   Medical Management of Chronic Issues    3 month follow up.     HPI: Patient is a 67 y.o. female for routine follow up.   DM- occasionally will have blood sugars in the 60-80s does not feel bad with these readings. On ozemepic, glimepiride and metformin.   Shoulder is doing good. No pain good ROM.   On zocor for cholesterol  No signs of blood loss.   Review of Systems:  Review of Systems  Constitutional:  Negative for chills, fever and weight loss.  HENT:  Negative for tinnitus.   Respiratory:  Negative for cough, sputum production and shortness of breath.   Cardiovascular:  Negative for chest pain, palpitations and leg swelling.  Gastrointestinal:  Negative for abdominal pain, constipation, diarrhea and heartburn.  Genitourinary:  Negative for dysuria, frequency and urgency.  Musculoskeletal:  Negative for back pain, falls, joint pain and myalgias.  Skin: Negative.   Neurological:  Negative for dizziness and headaches.  Psychiatric/Behavioral:  Negative for depression and memory loss. The patient does not have insomnia.     Past Medical History:  Diagnosis Date   Anemia    Arthritis    "knees, hands" (08/04/2012)   Chronic lower back pain    "all the time" (08/04/2012)   Dysrhythmia    Tachycardia, comes and goes. Increases with activity   History of blood transfusion 01/21/2002   "related to knee replacement" (08/04/2012)    History of blood transfusion    Hyperlipemia    Tachycardia    Type II diabetes mellitus [facility])    Past Surgical History:  Procedure Laterality Date   CARPAL TUNNEL RELEASE Left 09/21/1988   HERNIA REPAIR  01/21/1998   umbilical   ROTATOR CUFF REPAIR Right 03/25/2022   TOTAL KNEE ARTHROPLASTY Left 10/22/2002   TOTAL KNEE REVISION Left 08/04/2012   TOTAL KNEE REVISION Left 08/04/2012   Procedure: LEFT TOTAL KNEE REVISION;  Surgeon: Cammy Copa, MD;  Location: [facility] OR;  Service: Orthopedics;  Laterality: Left;   TUBAL LIGATION  01/21/1978   Social History:   reports that she quit smoking about 23 years ago. Her smoking use included cigarettes. She has a 23.00 pack-year smoking history. She has never used smokeless tobacco. She reports that she does not currently use alcohol. She reports that she does not use drugs.  Family History  Problem Relation Age of Onset   Diabetes Mother    Cancer Mother    Dementia Mother    Diabetes Father    Cancer Sister    Heart disease Sister    Diabetes Sister    Hypertension Sister    Stroke Sister    Diabetes Sister    Cancer Sister 52       lung   Diabetes Sister    Diabetes Sister    Diabetes Mellitus I Daughter     Medications: Patient's Medications  New Prescriptions   No medications on file  Previous Medications   ASPIRIN EC 81  MG TABLET    Take 81 mg by mouth daily.   BLOOD GLUCOSE MONITORING SUPPL (ONETOUCH VERIO REFLECT) W/DEVICE KIT    1 Device by Does not apply route in the morning and at bedtime.   GLIMEPIRIDE (AMARYL) 2 MG TABLET    TAKE 1 TABLET BY MOUTH EVERY DAY WITH BREAKFAST   GLUCOSE BLOOD (ONETOUCH VERIO) TEST STRIP    Use as instructed   METFORMIN (GLUCOPHAGE) 1000 MG TABLET    Take 1 tablet (1,000 mg total) by mouth 2 (two) times daily with a meal.   SEMAGLUTIDE, 1 MG/DOSE, (OZEMPIC, 1 MG/DOSE,) 2 MG/1.5ML SOPN    Inject 1 mg into the skin once a week.   SIMVASTATIN (ZOCOR) 40 MG TABLET    TAKE 1 TABLET BY  MOUTH EVERY DAY  Modified Medications   No medications on file  Discontinued Medications   FLUCONAZOLE (DIFLUCAN) 150 MG TABLET    Take 1 tablet (150 mg total) by mouth daily. Take one 150mg  tablet by mouth   FLUOCINOLONE ACETONIDE BODY 0.01 % OIL    Apply 1 Application topically once a week. Apply to scalp let sit over night or three hours then wash out with [employer] Zinc. Apply as needed for itch between shampoos    Physical Exam:  Vitals:   07/05/22 0922  BP: 132/70  Pulse: 74  Resp: 16  Temp: (!) 97.4 F (36.3 C)  SpO2: 98%  Weight: 194 lb 6.4 oz (88.2 kg)  Height: 5' 4.57" (1.64 m)   Body mass index is 32.78 kg/m. Wt Readings from Last 3 Encounters:  07/05/22 194 lb 6.4 oz (88.2 kg)  04/29/22 194 lb 6.4 oz (88.2 kg)  04/05/22 193 lb (87.5 kg)    Physical Exam Constitutional:      General: She is not in acute distress.    Appearance: She is well-developed. She is not diaphoretic.  HENT:     Head: Normocephalic and atraumatic.     Mouth/Throat:     Pharynx: No oropharyngeal exudate.  Eyes:     Conjunctiva/sclera: Conjunctivae normal.     Pupils: Pupils are equal, round, and reactive to light.  Cardiovascular:     Rate and Rhythm: Normal rate and regular rhythm.     Heart sounds: Normal heart sounds.  Pulmonary:     Effort: Pulmonary effort is normal.     Breath sounds: Normal breath sounds.  Abdominal:     General: Bowel sounds are normal.     Palpations: Abdomen is soft.  Musculoskeletal:     Cervical back: Normal range of motion and neck supple.     Right lower leg: No edema.     Left lower leg: No edema.  Skin:    General: Skin is warm and dry.  Neurological:     Mental Status: She is alert.  Psychiatric:        Mood and Affect: Mood normal.     Labs reviewed: Basic Metabolic Panel: Recent Labs    10/05/21 0947 03/06/22 0913  NA 140 139  K 4.7 4.8  CL 104 105  CO2 28 23  GLUCOSE 98 87  BUN 21 24  CREATININE 0.92 0.99  CALCIUM 8.9 9.4  TSH   --  1.08   Liver Function Tests: Recent Labs    10/05/21 0947 03/06/22 0913  AST 15 17  ALT 13 15  BILITOT 0.3 0.2  PROT 6.5 6.9   No results for input(s): "LIPASE", "AMYLASE" in the last 8760 hours.  No results for input(s): "AMMONIA" in the last 8760 hours. CBC: Recent Labs    10/05/21 0947 03/06/22 0913  WBC 6.3 6.7  NEUTROABS 3,232 3,276  HGB 9.3* 9.9*  HCT 29.9* 31.8*  MCV 84.2 84.4  PLT 282 300   Lipid Panel: Recent Labs    10/05/21 0947 03/06/22 0913  CHOL 145 160  HDL 55 58  LDLCALC 75 88  TRIG 71 59  CHOLHDL 2.6 2.8   TSH: Recent Labs    03/06/22 0913  TSH 1.08   A1C: Lab Results  Component Value Date   HGBA1C 6.4 (H) 03/06/2022     Assessment/Plan 1. Type 2 diabetes mellitus with diabetic neuropathy, with long-term current use of insulin (HCC) -will have her stop glimepiride at this time -Encouraged dietary compliance, routine foot care/monitoring and to keep up with diabetic eye exams through ophthalmology   2. Anemia, unspecified type Has been anemic her whole life. No symptoms.   3. Mixed hyperlipidemia -continues on zocor, follow up lipid panel at next follow up, consider increasing zocor to get LDL <70   4. Class 1 obesity due to excess calories with serious comorbidity and body mass index (BMI) of 32.0 to 32.9 in adult --education provided on healthy weight loss through increase in physical activity and proper nutrition     Return in about 4 months (around 11/04/2022) for routine follow up- labs with appt.  Janene Harvey. [facility]ag[facility] Adult Medicine [phone]

## 2022-07-05 NOTE — Patient Instructions (Signed)
STOP GLIMEPIRIDE.  

## 2022-07-06 ENCOUNTER — Other Ambulatory Visit: Payer: Self-pay | Admitting: Nurse Practitioner

## 2022-07-06 DIAGNOSIS — E782 Mixed hyperlipidemia: Secondary | ICD-10-CM

## 2022-07-06 DIAGNOSIS — E119 Type 2 diabetes mellitus without complications: Secondary | ICD-10-CM

## 2022-07-06 LAB — HEMOGLOBIN A1C
Hgb A1c MFr Bld: 6.2 % of total Hgb — ABNORMAL HIGH (ref <5.7)
Mean Plasma Glucose: 131 mg/dL
eAG (mmol/L): 7.3 mmol/L

## 2022-07-09 ENCOUNTER — Encounter: Payer: Self-pay | Admitting: Nurse Practitioner

## 2022-07-09 DIAGNOSIS — L219 Seborrheic dermatitis, unspecified: Secondary | ICD-10-CM

## 2022-07-09 MED ORDER — KETOCONAZOLE 2 % EX SHAM
1.0000 | MEDICATED_SHAMPOO | CUTANEOUS | 2 refills | Status: AC
Start: 2022-07-11 — End: ?

## 2022-08-19 ENCOUNTER — Ambulatory Visit: Payer: Medicare HMO | Admitting: Dermatology

## 2022-09-15 ENCOUNTER — Other Ambulatory Visit: Payer: Self-pay | Admitting: Family

## 2022-09-15 DIAGNOSIS — E1140 Type 2 diabetes mellitus with diabetic neuropathy, unspecified: Secondary | ICD-10-CM

## 2022-09-19 ENCOUNTER — Encounter: Payer: Self-pay | Admitting: Nurse Practitioner

## 2022-10-03 ENCOUNTER — Other Ambulatory Visit: Payer: Self-pay | Admitting: Nurse Practitioner

## 2022-10-03 DIAGNOSIS — E119 Type 2 diabetes mellitus without complications: Secondary | ICD-10-CM

## 2022-10-03 DIAGNOSIS — E782 Mixed hyperlipidemia: Secondary | ICD-10-CM

## 2022-10-16 DIAGNOSIS — Z6833 Body mass index (BMI) 33.0-33.9, adult: Secondary | ICD-10-CM | POA: Diagnosis not present

## 2022-10-16 DIAGNOSIS — Z01419 Encounter for gynecological examination (general) (routine) without abnormal findings: Secondary | ICD-10-CM | POA: Diagnosis not present

## 2022-10-25 ENCOUNTER — Ambulatory Visit
Admission: RE | Admit: 2022-10-25 | Discharge: 2022-10-25 | Disposition: A | Discharge status: Home / Self Care | Payer: Medicare HMO | Source: Ambulatory Visit | Attending: Obstetrics and Gynecology | Admitting: Obstetrics and Gynecology

## 2022-10-25 DIAGNOSIS — R921 Mammographic calcification found on diagnostic imaging of breast: Secondary | ICD-10-CM

## 2022-11-04 ENCOUNTER — Ambulatory Visit (INDEPENDENT_AMBULATORY_CARE_PROVIDER_SITE_OTHER): Payer: Medicare HMO | Admitting: Nurse Practitioner

## 2022-11-04 ENCOUNTER — Encounter: Payer: Self-pay | Admitting: Nurse Practitioner

## 2022-11-04 VITALS — BP 122/70 | HR 75 | Temp 97.1°F | Ht 64.0 in | Wt 195.8 lb

## 2022-11-04 DIAGNOSIS — Z794 Long term (current) use of insulin: Secondary | ICD-10-CM | POA: Diagnosis not present

## 2022-11-04 DIAGNOSIS — Z23 Encounter for immunization: Secondary | ICD-10-CM

## 2022-11-04 DIAGNOSIS — E6609 Other obesity due to excess calories: Secondary | ICD-10-CM | POA: Diagnosis not present

## 2022-11-04 DIAGNOSIS — M150 Primary generalized (osteo)arthritis: Secondary | ICD-10-CM | POA: Diagnosis not present

## 2022-11-04 DIAGNOSIS — E782 Mixed hyperlipidemia: Secondary | ICD-10-CM

## 2022-11-04 DIAGNOSIS — E1140 Type 2 diabetes mellitus with diabetic neuropathy, unspecified: Secondary | ICD-10-CM | POA: Diagnosis not present

## 2022-11-04 DIAGNOSIS — E66811 Obesity, class 1: Secondary | ICD-10-CM

## 2022-11-04 DIAGNOSIS — D649 Anemia, unspecified: Secondary | ICD-10-CM | POA: Diagnosis not present

## 2022-11-04 DIAGNOSIS — Z6832 Body mass index (BMI) 32.0-32.9, adult: Secondary | ICD-10-CM

## 2022-11-04 DIAGNOSIS — K5904 Chronic idiopathic constipation: Secondary | ICD-10-CM | POA: Diagnosis not present

## 2022-11-04 NOTE — Progress Notes (Signed)
Careteam: Patient Care Team: [name], NP as PCP - General (Geriatric Medicine) Zelphia Cairo, MD as Consulting Physician (Obstetrics and Gynecology) Glyn Ade, PA-C as Physician Assistant (Dermatology)  PLACE OF SERVICE:  [facility] CLINIC  Advanced Directive information Does Patient Have a Medical Advance Directive?: Yes, Type of Advance Directive: Out of facility DNR (pink MOST or yellow form), Pre-existing out of facility DNR order (yellow form or pink MOST form): Pink MOST form placed in chart (order not valid for inpatient use), Does patient want to make changes to medical advance directive?: No - Patient declined  Allergies  Allergen Reactions   Morphine And Codeine Itching    Chief Complaint  Patient presents with   Medical Management of Chronic Issues    4 month follow-up. Discuss need for shingrix, flu vaccine and covid booster.      HPI: Patient is a 67 y.o. female for routine follow up   Celebrated birthday in [birth year] and needs to get back on track with diet. Continues to exercise Monday-Thursday.   Stopped glimepiride- did not bring blood sugar readings. Reports no low blood sugars. States they have been "good"    Reports compliance with medication. Takes her ozempic on Sunday.   Review of Systems:  Review of Systems  Constitutional:  Negative for chills, fever and weight loss.  HENT:  Negative for tinnitus.   Respiratory:  Negative for cough, sputum production and shortness of breath.   Cardiovascular:  Negative for chest pain, palpitations and leg swelling.  Gastrointestinal:  Positive for constipation. Negative for abdominal pain, diarrhea and heartburn.  Genitourinary:  Negative for dysuria, frequency and urgency.  Musculoskeletal:  Positive for joint pain. Negative for back pain, falls and myalgias.  Skin: Negative.   Neurological:  Negative for dizziness and headaches.  Psychiatric/Behavioral:  Negative for depression and memory loss.  The patient does not have insomnia.     Past Medical History:  Diagnosis Date   Anemia    Arthritis    "knees, hands" (08/04/2012)   Chronic lower back pain    "all the time" (08/04/2012)   Dysrhythmia    Tachycardia, comes and goes. Increases with activity   History of blood transfusion 01/21/2002   "related to knee replacement" (08/04/2012)   History of blood transfusion    Hyperlipemia    Tachycardia    Type II diabetes mellitus [facility])    Past Surgical History:  Procedure Laterality Date   CARPAL TUNNEL RELEASE Left 09/21/1988   HERNIA REPAIR  01/21/1998   umbilical   ROTATOR CUFF REPAIR Right 03/25/2022   TOTAL KNEE ARTHROPLASTY Left 10/22/2002   TOTAL KNEE REVISION Left 08/04/2012   TOTAL KNEE REVISION Left 08/04/2012   Procedure: LEFT TOTAL KNEE REVISION;  Surgeon: Cammy Copa, MD;  Location: [facility] OR;  Service: Orthopedics;  Laterality: Left;   TUBAL LIGATION  01/21/1978   Social History:   reports that she quit smoking about 23 years ago. Her smoking use included cigarettes. She started smoking about 46 years ago. She has a 23 pack-year smoking history. She has never used smokeless tobacco. She reports that she does not currently use alcohol. She reports that she does not use drugs.  Family History  Problem Relation Age of Onset   Diabetes Mother    Cancer Mother    Dementia Mother    Diabetes Father    Cancer Sister    Heart disease Sister    Diabetes Sister    Hypertension Sister  Stroke Sister    Diabetes Sister    Cancer Sister 42       lung   Diabetes Sister    Diabetes Sister    Diabetes Mellitus I Daughter     Medications: Patient's Medications  New Prescriptions   No medications on file  Previous Medications   ASPIRIN EC 81 MG TABLET    Take 81 mg by mouth daily.   BLOOD GLUCOSE MONITORING SUPPL (ONETOUCH VERIO REFLECT) W/DEVICE KIT    1 Device by Does not apply route in the morning and at bedtime.   KETOCONAZOLE (NIZORAL) 2 % SHAMPOO     Apply 1 Application topically 2 (two) times a week.   METFORMIN (GLUCOPHAGE) 1000 MG TABLET    TAKE 1 TABLET (1,000 MG TOTAL) BY MOUTH TWICE A DAY WITH FOOD   ONETOUCH VERIO TEST STRIP    USE AS INSTRUCTED   SEMAGLUTIDE, 1 MG/DOSE, (OZEMPIC, 1 MG/DOSE,) 2 MG/1.5ML SOPN    Inject 1 mg into the skin once a week.   SIMVASTATIN (ZOCOR) 40 MG TABLET    TAKE 1 TABLET BY MOUTH EVERY DAY  Modified Medications   No medications on file  Discontinued Medications   No medications on file    Physical Exam:  Vitals:   11/04/22 0831  BP: 122/70  Pulse: 75  Temp: (!) 97.1 F (36.2 C)  TempSrc: Temporal  SpO2: 99%  Weight: 195 lb 12.8 oz (88.8 kg)  Height: 5\' 4"  (1.626 m)   Body mass index is 33.61 kg/m. Wt Readings from Last 3 Encounters:  11/04/22 195 lb 12.8 oz (88.8 kg)  07/05/22 194 lb 6.4 oz (88.2 kg)  04/29/22 194 lb 6.4 oz (88.2 kg)    Physical Exam Constitutional:      General: She is not in acute distress.    Appearance: She is well-developed. She is not diaphoretic.  HENT:     Head: Normocephalic and atraumatic.     Mouth/Throat:     Pharynx: No oropharyngeal exudate.  Eyes:     Conjunctiva/sclera: Conjunctivae normal.     Pupils: Pupils are equal, round, and reactive to light.  Cardiovascular:     Rate and Rhythm: Normal rate and regular rhythm.     Heart sounds: Normal heart sounds.  Pulmonary:     Effort: Pulmonary effort is normal.     Breath sounds: Normal breath sounds.  Abdominal:     General: Bowel sounds are normal.     Palpations: Abdomen is soft.  Musculoskeletal:     Cervical back: Normal range of motion and neck supple.     Right lower leg: No edema.     Left lower leg: No edema.  Skin:    General: Skin is warm and dry.  Neurological:     Mental Status: She is alert.  Psychiatric:        Mood and Affect: Mood normal.     Labs reviewed: Basic Metabolic Panel: Recent Labs    03/06/22 0913  NA 139  K 4.8  CL 105  CO2 23  GLUCOSE 87  BUN  24  CREATININE 0.99  CALCIUM 9.4  TSH 1.08   Liver Function Tests: Recent Labs    03/06/22 0913  AST 17  ALT 15  BILITOT 0.2  PROT 6.9   No results for input(s): "LIPASE", "AMYLASE" in the last 8760 hours. No results for input(s): "AMMONIA" in the last 8760 hours. CBC: Recent Labs    03/06/22 0913  WBC 6.7  NEUTROABS 3,276  HGB 9.9*  HCT 31.8*  MCV 84.4  PLT 300   Lipid Panel: Recent Labs    03/06/22 0913  CHOL 160  HDL 58  LDLCALC 88  TRIG 59  CHOLHDL 2.8   TSH: Recent Labs    03/06/22 0913  TSH 1.08   A1C: Lab Results  Component Value Date   HGBA1C 6.2 (H) 07/05/2022     Assessment/Plan 1. Type 2 diabetes mellitus with diabetic neuropathy, with long-term current use of insulin (HCC) -Encouraged dietary compliance, routine foot care/monitoring and to keep up with diabetic eye exams through ophthalmology  No low blood sugars at this time. Continues on metformin with ozempic weekly - Hemoglobin A1c  2. Mixed hyperlipidemia Goal LDL <70, discussing changing medication if needed to bring cholesterol to goal Dietary modifications also encouraged.  - Lipid panel  3. Class 1 obesity due to excess calories with serious comorbidity and body mass index (BMI) of 32.0 to 32.9 in adult --education provided on healthy weight loss through increase in physical activity and proper nutrition   - Complete Metabolic Panel with eGFR  4. Primary osteoarthritis involving multiple joints -stable at this time, continue exercise  5. Anemia, unspecified type Chronic and stable on last labs, will follow up - CBC with Differential/Platelet  6. Need for influenza vaccination - Flu Vaccine Trivalent High Dose (Fluad)  7. Chronic idiopathic constipation Ongoing, can use miralax routinely to help  Return in about 4 months (around 03/07/2023) for routine follow up .  Janene Harvey. [facility]ag[facility] Adult Medicine [phone]

## 2022-11-04 NOTE — Patient Instructions (Signed)
Work on dietary modifications.   Increase fiber and water to help with constipation.

## 2022-11-05 LAB — COMPLETE METABOLIC PANEL WITH GFR
AG Ratio: 1.4 (calc) (ref 1.0–2.5)
ALT: 12 U/L (ref 6–29)
AST: 14 U/L (ref 10–35)
Albumin: 3.9 g/dL (ref 3.6–5.1)
Alkaline phosphatase (APISO): 82 U/L (ref 37–153)
BUN/Creatinine Ratio: 19 (calc) (ref 6–22)
BUN: 20 mg/dL (ref 7–25)
CO2: 28 mmol/L (ref 20–32)
Calcium: 9.3 mg/dL (ref 8.6–10.4)
Chloride: 103 mmol/L (ref 98–110)
Creat: 1.06 mg/dL — ABNORMAL HIGH (ref 0.50–1.05)
Globulin: 2.8 g/dL (ref 1.9–3.7)
Glucose, Bld: 107 mg/dL — ABNORMAL HIGH (ref 65–99)
Potassium: 4.8 mmol/L (ref 3.5–5.3)
Sodium: 140 mmol/L (ref 135–146)
Total Bilirubin: 0.4 mg/dL (ref 0.2–1.2)
Total Protein: 6.7 g/dL (ref 6.1–8.1)
eGFR: 58 mL/min/{1.73_m2} — ABNORMAL LOW (ref >=60)

## 2022-11-05 LAB — CBC WITH DIFFERENTIAL/PLATELET
Absolute Monocytes: 416 {cells}/uL (ref 200–950)
Basophils Absolute: 32 {cells}/uL (ref 0–200)
Basophils Relative: 0.5 %
Eosinophils Absolute: 102 {cells}/uL (ref 15–500)
Eosinophils Relative: 1.6 %
HCT: 30.8 % — ABNORMAL LOW (ref 35.0–45.0)
Hemoglobin: 9.4 g/dL — ABNORMAL LOW (ref 11.7–15.5)
Lymphs Abs: 2630 {cells}/uL (ref 850–3900)
MCH: 26.1 pg — ABNORMAL LOW (ref 27.0–33.0)
MCHC: 30.5 g/dL — ABNORMAL LOW (ref 32.0–36.0)
MCV: 85.6 fL (ref 80.0–100.0)
MPV: 10.5 fL (ref 7.5–12.5)
Monocytes Relative: 6.5 %
Neutro Abs: 3219 {cells}/uL (ref 1500–7800)
Neutrophils Relative %: 50.3 %
Platelets: 302 10*3/uL (ref 140–400)
RBC: 3.6 10*6/uL — ABNORMAL LOW (ref 3.80–5.10)
RDW: 12.7 % (ref 11.0–15.0)
Total Lymphocyte: 41.1 %
WBC: 6.4 10*3/uL (ref 3.8–10.8)

## 2022-11-05 LAB — LIPID PANEL
Cholesterol: 158 mg/dL (ref <200)
HDL: 60 mg/dL (ref >=50)
LDL Cholesterol (Calc): 81 mg/dL
Non-HDL Cholesterol (Calc): 98 mg/dL (ref <130)
Total CHOL/HDL Ratio: 2.6 (calc) (ref <5.0)
Triglycerides: 88 mg/dL (ref <150)

## 2022-11-05 LAB — HEMOGLOBIN A1C
Hgb A1c MFr Bld: 6.6 %{Hb} — ABNORMAL HIGH (ref <5.7)
Mean Plasma Glucose: 143 mg/dL
eAG (mmol/L): 7.9 mmol/L

## 2022-11-08 ENCOUNTER — Other Ambulatory Visit: Payer: Self-pay

## 2022-11-08 ENCOUNTER — Other Ambulatory Visit: Payer: Self-pay | Admitting: Nurse Practitioner

## 2022-11-08 DIAGNOSIS — E782 Mixed hyperlipidemia: Secondary | ICD-10-CM

## 2022-11-08 MED ORDER — ATORVASTATIN CALCIUM 40 MG PO TABS
40.0000 mg | ORAL_TABLET | Freq: Every day | ORAL | 3 refills | Status: DC
Start: 2022-11-08 — End: 2023-03-07

## 2022-11-09 ENCOUNTER — Encounter: Payer: Self-pay | Admitting: Nurse Practitioner

## 2022-11-11 NOTE — Telephone Encounter (Signed)
Message routed to PCP Eubanks, Jessica K, NP  

## 2022-12-03 DIAGNOSIS — H40013 Open angle with borderline findings, low risk, bilateral: Secondary | ICD-10-CM | POA: Diagnosis not present

## 2022-12-03 DIAGNOSIS — H2513 Age-related nuclear cataract, bilateral: Secondary | ICD-10-CM | POA: Diagnosis not present

## 2022-12-24 ENCOUNTER — Telehealth: Payer: Medicare HMO

## 2022-12-24 NOTE — Telephone Encounter (Signed)
Have not seen any papers, check with CI or admin.

## 2022-12-24 NOTE — Telephone Encounter (Signed)
Patient states she is calling because she dropped some papers off before thanksgiving and was just checking the status.

## 2022-12-24 NOTE — Telephone Encounter (Signed)
Patient paperwork was located and is going to be placed in the folder on your desk

## 2022-12-27 NOTE — Telephone Encounter (Signed)
[employer] Patient Assistance Program Forms for Ozempic placed on CI desk. Completed and signed.

## 2023-01-10 ENCOUNTER — Other Ambulatory Visit: Payer: Medicare HMO

## 2023-01-29 ENCOUNTER — Encounter: Payer: Self-pay | Admitting: Surgical

## 2023-01-29 ENCOUNTER — Other Ambulatory Visit (INDEPENDENT_AMBULATORY_CARE_PROVIDER_SITE_OTHER): Payer: Medicare (Managed Care)

## 2023-01-29 ENCOUNTER — Ambulatory Visit: Payer: Medicare (Managed Care) | Admitting: Surgical

## 2023-01-29 DIAGNOSIS — G8929 Other chronic pain: Secondary | ICD-10-CM

## 2023-01-29 DIAGNOSIS — M1711 Unilateral primary osteoarthritis, right knee: Secondary | ICD-10-CM | POA: Diagnosis not present

## 2023-01-29 DIAGNOSIS — M25561 Pain in right knee: Secondary | ICD-10-CM

## 2023-01-29 MED ORDER — LIDOCAINE HCL 1 % IJ SOLN
5.0000 mL | INTRAMUSCULAR | Status: AC | PRN
Start: 2023-01-29 — End: 2023-01-29
  Administered 2023-01-29: 5 mL

## 2023-01-29 MED ORDER — BUPIVACAINE HCL 0.25 % IJ SOLN
4.0000 mL | INTRAMUSCULAR | Status: AC | PRN
Start: 2023-01-29 — End: 2023-01-29
  Administered 2023-01-29: 4 mL via INTRA_ARTICULAR

## 2023-01-29 MED ORDER — METHYLPREDNISOLONE ACETATE 40 MG/ML IJ SUSP
40.0000 mg | INTRAMUSCULAR | Status: AC | PRN
Start: 2023-01-29 — End: 2023-01-29
  Administered 2023-01-29: 40 mg via INTRA_ARTICULAR

## 2023-01-29 NOTE — Progress Notes (Signed)
 Office Visit Note   [name]           Date of Birth: 04-12-1955           MRN: [id number] Visit Date: 01/29/2023 Requested by: Caro Harlene POUR, NP [address] [place]. [place],  [place] [address] PCP: Caro Harlene POUR, NP  Subjective: No chief complaint on file.   HPI: Brittany Klein is a 68 y.o. female who presents to the office reporting right knee pain.  Patient states that she has had pain since 11/24/2022 when she did a new workout class that involves a lot of squatting and running.  She states that since this event she has had diffuse right knee pain with increased stiffness and pressure.  She notes getting up and down especially navigating stairs is very difficult.  She has constant pain that is waking her up.  Pain will cause her knee to give out on her.  She has occasional radiation to the shin but no proximal pain in the hip region.  She has prior left total knee arthroplasty by Dr. Addie in 2005.  No arthroplasty procedure to the right knee.  She does have history of diabetes but last A1c was below 7.  No falls..                ROS: All systems reviewed are negative as they relate to the chief complaint within the history of present illness.  Patient denies fevers or chills.  Assessment & Plan: Visit Diagnoses:  1. Unilateral primary osteoarthritis, right knee   2. Chronic pain of right knee     Plan: Patient is a 68 year old female who presents for evaluation of right knee pain.  She has history of right knee osteoarthritis with radiographs taken today demonstrating mild progression compared with last set of radiographs from several years ago.  Arthritis is primarily confined to the patellofemoral compartment.  Pain has been worse since an exercise class in November and she would like to try cortisone injection today.  Right knee was aspirated of 35 cc of nonpurulent synovial fluid and cortisone injection administered without complication.  She will follow-up with the  office as needed if pain does not improve.  Follow-Up Instructions: No follow-ups on file.   Orders:  Orders Placed This Encounter  Procedures   XR KNEE 3 VIEW RIGHT   No orders of the defined types were placed in this encounter.     Procedures: Large Joint Inj: R knee on 01/29/2023 4:33 PM Indications: diagnostic evaluation, joint swelling and pain Details: 18 G 1.5 in needle, superolateral approach  Arthrogram: No  Medications: 5 mL lidocaine  1 %; 40 mg methylPREDNISolone  acetate 40 MG/ML; 4 mL bupivacaine  0.25 % Aspirate: 35 mL Outcome: tolerated well, no immediate complications Procedure, treatment alternatives, risks and benefits explained, specific risks discussed. Consent was given by the patient. Immediately prior to procedure a time out was called to verify the correct patient, procedure, equipment, support staff and site/side marked as required. Patient was prepped and draped in the usual sterile fashion.       Clinical Data: No additional findings.  Objective: Vital Signs: There were no vitals taken for this visit.  Physical Exam:  Constitutional: Patient appears well-developed HEENT:  Head: Normocephalic Eyes:EOM are normal Neck: Normal range of motion Cardiovascular: Normal rate Pulmonary/chest: Effort normal Neurologic: Patient is alert Skin: Skin is warm Psychiatric: Patient has normal mood and affect  Ortho Exam: Ortho exam demonstrates right knee with  large effusion.  Tenderness over the medial and lateral joint line is present moderately.  She has significant patellofemoral crepitus and tenderness through the lateral patellofemoral region.  No cellulitis or skin changes noted.  No calf tenderness.  Negative Homans' sign.  No pain with hip range of motion.  Able to perform straight leg raise without extensor lag.  No laxity to anterior posterior drawer sign.  Specialty Comments:  No specialty comments available.  Imaging: No results found.   PMFS  History: Patient Active Problem List   Diagnosis Date Noted   Anemia 03/19/2021   Primary osteoarthritis involving multiple joints 03/19/2021   Recurrent major depressive disorder, in partial remission (HCC) 03/19/2021   Mixed hyperlipidemia 06/03/2018   Type 2 diabetes mellitus with diabetic neuropathy, with long-term current use of insulin  (HCC) 03/04/2018   Prolapse of female bladder, acquired 03/04/2018   Past Medical History:  Diagnosis Date   Anemia    Arthritis    knees, hands (08/04/2012)   Chronic lower back pain    all the time (08/04/2012)   Dysrhythmia    Tachycardia, comes and goes. Increases with activity   History of blood transfusion 01/21/2002   related to knee replacement (08/04/2012)   History of blood transfusion    Hyperlipemia    Tachycardia    Type II diabetes mellitus (HCC)     Family History  Problem Relation Age of Onset   Diabetes Mother    Cancer Mother    Dementia Mother    Diabetes Father    Cancer Sister    Heart disease Sister    Diabetes Sister    Hypertension Sister    Stroke Sister    Diabetes Sister    Cancer Sister 49       lung   Diabetes Sister    Diabetes Sister    Diabetes Mellitus I Daughter     Past Surgical History:  Procedure Laterality Date   CARPAL TUNNEL RELEASE Left 09/21/1988   HERNIA REPAIR  01/21/1998   umbilical   ROTATOR CUFF REPAIR Right 03/25/2022   TOTAL KNEE ARTHROPLASTY Left 10/22/2002   TOTAL KNEE REVISION Left 08/04/2012   TOTAL KNEE REVISION Left 08/04/2012   Procedure: LEFT TOTAL KNEE REVISION;  Surgeon: Cordella Glendia Hutchinson, MD;  Location: [facility] OR;  Service: Orthopedics;  Laterality: Left;   TUBAL LIGATION  01/21/1978   Social History   Occupational History   Not on file  Tobacco Use   Smoking status: Former    Current packs/day: 0.00    Average packs/day: 1 pack/day for 23.0 years (23.0 ttl pk-yrs)    Types: Cigarettes    Start date: 12/01/1975    Quit date: 12/01/1998    Years since  quitting: 24.1   Smokeless tobacco: Never  Vaping Use   Vaping status: Never Used  Substance and Sexual Activity   Alcohol use: Not Currently    Comment: 08/05/2012 don't drink anymore; stopped ~ 11/1998; never had problem w/it   Drug use: No   Sexual activity: Not on file

## 2023-01-30 ENCOUNTER — Encounter: Payer: Self-pay | Admitting: Nurse Practitioner

## 2023-02-12 ENCOUNTER — Ambulatory Visit: Payer: Medicare HMO | Admitting: Orthopedic Surgery

## 2023-03-04 ENCOUNTER — Telehealth: Payer: Self-pay

## 2023-03-04 NOTE — Telephone Encounter (Signed)
Call patient to let her that the medication for Ozempic has arrived and that she can come pick it up.  Left message on voicemail for patient to return call when available.

## 2023-03-07 ENCOUNTER — Ambulatory Visit (INDEPENDENT_AMBULATORY_CARE_PROVIDER_SITE_OTHER): Payer: Medicare (Managed Care) | Admitting: Nurse Practitioner

## 2023-03-07 ENCOUNTER — Encounter: Payer: Self-pay | Admitting: Nurse Practitioner

## 2023-03-07 VITALS — BP 132/78 | HR 67 | Temp 97.7°F | Ht 64.0 in | Wt 189.0 lb

## 2023-03-07 DIAGNOSIS — E782 Mixed hyperlipidemia: Secondary | ICD-10-CM | POA: Diagnosis not present

## 2023-03-07 DIAGNOSIS — Z794 Long term (current) use of insulin: Secondary | ICD-10-CM | POA: Diagnosis not present

## 2023-03-07 DIAGNOSIS — Z6832 Body mass index (BMI) 32.0-32.9, adult: Secondary | ICD-10-CM | POA: Diagnosis not present

## 2023-03-07 DIAGNOSIS — D649 Anemia, unspecified: Secondary | ICD-10-CM | POA: Diagnosis not present

## 2023-03-07 DIAGNOSIS — E66811 Obesity, class 1: Secondary | ICD-10-CM

## 2023-03-07 DIAGNOSIS — E6609 Other obesity due to excess calories: Secondary | ICD-10-CM | POA: Diagnosis not present

## 2023-03-07 DIAGNOSIS — M150 Primary generalized (osteo)arthritis: Secondary | ICD-10-CM | POA: Diagnosis not present

## 2023-03-07 DIAGNOSIS — E1140 Type 2 diabetes mellitus with diabetic neuropathy, unspecified: Secondary | ICD-10-CM | POA: Diagnosis not present

## 2023-03-07 DIAGNOSIS — N289 Disorder of kidney and ureter, unspecified: Secondary | ICD-10-CM | POA: Diagnosis not present

## 2023-03-07 DIAGNOSIS — N3281 Overactive bladder: Secondary | ICD-10-CM

## 2023-03-07 MED ORDER — ATORVASTATIN CALCIUM 40 MG PO TABS
40.0000 mg | ORAL_TABLET | Freq: Every day | ORAL | 3 refills | Status: AC
Start: 2023-03-07 — End: ?

## 2023-03-07 NOTE — Progress Notes (Signed)
Careteam: Patient Care Team: [name], NP as PCP - General (Geriatric Medicine) Zelphia Cairo, MD as Consulting Physician (Obstetrics and Gynecology) Glyn Ade, PA-C as Physician Assistant (Dermatology)  PLACE OF SERVICE:  [facility]  Advanced Directive information    Allergies  Allergen Reactions   Morphine And Codeine Itching    Chief Complaint  Patient presents with   Medical Management of Chronic Issues    Medical Management of Chronic Issues. 4 Month Follow up. Need for Zoster.     HPI: pt is a 68 y.o. female for follow up.  Discussed the use of AI scribe software for clinical note transcription with the patient, who gave verbal consent to proceed.  History of Present Illness   Brittany Klein is a 68 year old female with diabetes and hyperlipidemia who presents for a four-month follow-up visit.  She manages her diabetes with metformin 1000 mg twice daily and Ozempic. Her blood sugars have been stable, ranging from 100 to 115, since her glimepiride dose was reduced due to previous hypoglycemic episodes. No recent hypoglycemic events have occurred. She participates in a 45-minute senior exercise class Monday through Friday, which she enjoys as a social activity.  For hyperlipidemia, she has not started Lipitor and has run out of simvastatin. Her LDL was previously 81 but goal is <70 due to diabetes  She experiences ongoing knee pain due to arthritis, which has improved somewhat following cortisone injections, although she still has difficulty getting up and down. She continues to exercise to maintain leg strength.  She mentions an overactive bladder, which causes frequent urination and reluctance to drink water. She has not pursued treatment for this issue yet.  In early January, she was knocked down by a neighbor's dog, resulting in a fall on her elbow. The elbow is still slightly tender when leaning on it, but there is no significant pain and she is  able to move it without issues.  No chest pain, palpitations, blood in urine, or vaginal bleeding. Bowel movements are regular.   She has switched her pharmacy to [facility] for convenience.      Review of Systems:  Review of Systems  Constitutional:  Negative for chills, fever and weight loss.  HENT:  Negative for tinnitus.   Respiratory:  Negative for cough, sputum production and shortness of breath.   Cardiovascular:  Negative for chest pain, palpitations and leg swelling.  Gastrointestinal:  Negative for abdominal pain, constipation, diarrhea and heartburn.  Genitourinary:  Negative for dysuria, frequency and urgency.  Musculoskeletal:  Negative for back pain, falls, joint pain and myalgias.  Skin: Negative.   Neurological:  Negative for dizziness and headaches.  Psychiatric/Behavioral:  Negative for depression and memory loss. The patient does not have insomnia.     Past Medical History:  Diagnosis Date   Anemia    Arthritis    "knees, hands" (08/04/2012)   Chronic lower back pain    "all the time" (08/04/2012)   Dysrhythmia    Tachycardia, comes and goes. Increases with activity   History of blood transfusion 01/21/2002   "related to knee replacement" (08/04/2012)   History of blood transfusion    Hyperlipemia    Tachycardia    Type II diabetes mellitus [facility])    Past Surgical History:  Procedure Laterality Date   CARPAL TUNNEL RELEASE Left 09/21/1988   HERNIA REPAIR  01/21/1998   umbilical   ROTATOR CUFF REPAIR Right 03/25/2022   TOTAL KNEE ARTHROPLASTY Left 10/22/2002  TOTAL KNEE REVISION Left 08/04/2012   TOTAL KNEE REVISION Left 08/04/2012   Procedure: LEFT TOTAL KNEE REVISION;  Surgeon: Cammy Copa, MD;  Location: [facility] OR;  Service: Orthopedics;  Laterality: Left;   TUBAL LIGATION  01/21/1978   Social History:   reports that she quit smoking about 24 years ago. Her smoking use included cigarettes. She started smoking about 47 years ago. She has a 23  pack-year smoking history. She has never used smokeless tobacco. She reports that she does not currently use alcohol. She reports that she does not use drugs.  Family History  Problem Relation Age of Onset   Diabetes Mother    Cancer Mother    Dementia Mother    Diabetes Father    Cancer Sister    Heart disease Sister    Diabetes Sister    Hypertension Sister    Stroke Sister    Diabetes Sister    Cancer Sister 83       lung   Diabetes Sister    Diabetes Sister    Diabetes Mellitus I Daughter     Medications: Patient's Medications  New Prescriptions   No medications on file  Previous Medications   ASPIRIN EC 81 MG TABLET    Take 81 mg by mouth daily.   BLOOD GLUCOSE MONITORING SUPPL (ONETOUCH VERIO REFLECT) W/DEVICE KIT    1 Device by Does not apply route in the morning and at bedtime.   KETOCONAZOLE (NIZORAL) 2 % SHAMPOO    Apply 1 Application topically 2 (two) times a week.   METFORMIN (GLUCOPHAGE) 1000 MG TABLET    TAKE 1 TABLET (1,000 MG TOTAL) BY MOUTH TWICE A DAY WITH FOOD   ONETOUCH VERIO TEST STRIP    USE AS INSTRUCTED   SEMAGLUTIDE, 1 MG/DOSE, (OZEMPIC, 1 MG/DOSE,) 2 MG/1.5ML SOPN    Inject 1 mg into the skin once a week.  Modified Medications   Modified Medication Previous Medication   ATORVASTATIN (LIPITOR) 40 MG TABLET atorvastatin (LIPITOR) 40 MG tablet      Take 1 tablet (40 mg total) by mouth daily.    Take 1 tablet (40 mg total) by mouth daily.  Discontinued Medications   No medications on file    Physical Exam:  Vitals:   03/07/23 0923  BP: 132/78  Pulse: 67  Temp: 97.7 F (36.5 C)  SpO2: 97%  Weight: 189 lb (85.7 kg)  Height: 5\' 4"  (1.626 m)   Body mass index is 32.44 kg/m. Wt Readings from Last 3 Encounters:  03/07/23 189 lb (85.7 kg)  11/04/22 195 lb 12.8 oz (88.8 kg)  07/05/22 194 lb 6.4 oz (88.2 kg)    Physical Exam Constitutional:      General: She is not in acute distress.    Appearance: She is well-developed. She is not  diaphoretic.  HENT:     Head: Normocephalic and atraumatic.     Mouth/Throat:     Pharynx: No oropharyngeal exudate.  Eyes:     Conjunctiva/sclera: Conjunctivae normal.     Pupils: Pupils are equal, round, and reactive to light.  Cardiovascular:     Rate and Rhythm: Normal rate and regular rhythm.     Heart sounds: Normal heart sounds.  Pulmonary:     Effort: Pulmonary effort is normal.     Breath sounds: Normal breath sounds.  Abdominal:     General: Bowel sounds are normal.     Palpations: Abdomen is soft.  Musculoskeletal:     Cervical  back: Normal range of motion and neck supple.     Right lower leg: No edema.     Left lower leg: No edema.  Skin:    General: Skin is warm and dry.  Neurological:     Mental Status: She is alert.  Psychiatric:        Mood and Affect: Mood normal.     Labs reviewed: Basic Metabolic Panel: Recent Labs    11/04/22 0916  NA 140  K 4.8  CL 103  CO2 28  GLUCOSE 107*  BUN 20  CREATININE 1.06*  CALCIUM 9.3   Liver Function Tests: Recent Labs    11/04/22 0916  AST 14  ALT 12  BILITOT 0.4  PROT 6.7   No results for input(s): "LIPASE", "AMYLASE" in the last 8760 hours. No results for input(s): "AMMONIA" in the last 8760 hours. CBC: Recent Labs    11/04/22 0916  WBC 6.4  NEUTROABS 3,219  HGB 9.4*  HCT 30.8*  MCV 85.6  PLT 302   Lipid Panel: Recent Labs    11/04/22 0916  CHOL 158  HDL 60  LDLCALC 81  TRIG 88  CHOLHDL 2.6   TSH: No results for input(s): "TSH" in the last 8760 hours. A1C: Lab Results  Component Value Date   HGBA1C 6.6 (H) 11/04/2022     Assessment/Plan Assessment and Plan    Elbow Injury Fall in early January due to a dog attack, resulting in elbow pain. No restriction in movement, pain only when leaning on it. -Continue monitoring at home.  Hyperlipidemia LDL slightly above goal (81), patient has not yet started on Lipitor. -Call in Lipitor prescription to [facility]. -Recheck  cholesterol at next appointment in four months.  Type 2 Diabetes Mellitus Blood sugars have been slightly elevated since reducing glimepiride dose due to hypoglycemia. Currently on metformin 1000mg  twice daily and Ozempic. -discussed use of ACEi/ARB for renal protection but she declines at this time -Encouraged dietary compliance, routine foot care/monitoring and to keep up with diabetic eye exams through ophthalmology  -Continue current regimen. -Check A1C today.  Obesity BMI 32, patient has lost weight and is participating in regular exercise. -Encourage continuation of exercise and healthy diet for healthy weight loss   Overactive Bladder Patient reports frequent urination and feeling of incomplete bladder emptying, impacting fluid intake. Previous plans for pelvic floor therapy fell through. -Consider restarting pelvic floor therapy. -Discuss potential for medication if symptoms persist.  OA of bilateral knees  Pain in knees, previously received injections. -Continue monitoring at home. -Encourage continuation of exercise to strengthen quadriceps.  Renal Impairment Slight impairment noted at last visit, patient has history of diabetes. -Check renal function today. -Discuss potential for low-dose ACE inhibitor or ARB to protect kidneys at next visit, depending on today's results.  Anemia  Follow up cbc    Return in about 4 months (around 07/05/2023) for routine follow up, labs at time of visit. Janene Harvey. Biag[address] Borg [address] [facility] [phone]

## 2023-03-08 LAB — CBC WITH DIFFERENTIAL/PLATELET
Absolute Lymphocytes: 2492 {cells}/uL (ref 850–3900)
Absolute Monocytes: 360 {cells}/uL (ref 200–950)
Basophils Absolute: 31 {cells}/uL (ref 0–200)
Basophils Relative: 0.5 %
Eosinophils Absolute: 93 {cells}/uL (ref 15–500)
Eosinophils Relative: 1.5 %
HCT: 30.6 % — ABNORMAL LOW (ref 35.0–45.0)
Hemoglobin: 9.6 g/dL — ABNORMAL LOW (ref 11.7–15.5)
MCH: 26.7 pg — ABNORMAL LOW (ref 27.0–33.0)
MCHC: 31.4 g/dL — ABNORMAL LOW (ref 32.0–36.0)
MCV: 85 fL (ref 80.0–100.0)
MPV: 10.2 fL (ref 7.5–12.5)
Monocytes Relative: 5.8 %
Neutro Abs: 3224 {cells}/uL (ref 1500–7800)
Neutrophils Relative %: 52 %
Platelets: 339 10*3/uL (ref 140–400)
RBC: 3.6 10*6/uL — ABNORMAL LOW (ref 3.80–5.10)
RDW: 12.8 % (ref 11.0–15.0)
Total Lymphocyte: 40.2 %
WBC: 6.2 10*3/uL (ref 3.8–10.8)

## 2023-03-08 LAB — HEMOGLOBIN A1C
Hgb A1c MFr Bld: 6.6 %{Hb} — ABNORMAL HIGH (ref <5.7)
Mean Plasma Glucose: 143 mg/dL
eAG (mmol/L): 7.9 mmol/L

## 2023-03-08 LAB — COMPLETE METABOLIC PANEL WITH GFR
AG Ratio: 1.3 (calc) (ref 1.0–2.5)
ALT: 9 U/L (ref 6–29)
AST: 13 U/L (ref 10–35)
Albumin: 3.9 g/dL (ref 3.6–5.1)
Alkaline phosphatase (APISO): 80 U/L (ref 37–153)
BUN: 16 mg/dL (ref 7–25)
CO2: 27 mmol/L (ref 20–32)
Calcium: 9.5 mg/dL (ref 8.6–10.4)
Chloride: 104 mmol/L (ref 98–110)
Creat: 0.96 mg/dL (ref 0.50–1.05)
Globulin: 3 g/dL (ref 1.9–3.7)
Glucose, Bld: 97 mg/dL (ref 65–99)
Potassium: 5.4 mmol/L — ABNORMAL HIGH (ref 3.5–5.3)
Sodium: 138 mmol/L (ref 135–146)
Total Bilirubin: 0.3 mg/dL (ref 0.2–1.2)
Total Protein: 6.9 g/dL (ref 6.1–8.1)
eGFR: 65 mL/min/{1.73_m2} (ref >=60)

## 2023-03-10 ENCOUNTER — Encounter: Payer: Self-pay | Admitting: Nurse Practitioner

## 2023-03-12 ENCOUNTER — Encounter: Payer: Self-pay | Admitting: Nurse Practitioner

## 2023-03-13 ENCOUNTER — Other Ambulatory Visit: Payer: Self-pay

## 2023-03-13 DIAGNOSIS — E1140 Type 2 diabetes mellitus with diabetic neuropathy, unspecified: Secondary | ICD-10-CM

## 2023-03-13 MED ORDER — ONETOUCH VERIO VI STRP: ORAL_STRIP | 4 refills | Status: AC

## 2023-04-04 LAB — HM DIABETES EYE EXAM

## 2023-04-11 DIAGNOSIS — M1711 Unilateral primary osteoarthritis, right knee: Secondary | ICD-10-CM | POA: Diagnosis not present

## 2023-05-02 ENCOUNTER — Encounter: Payer: Medicare HMO | Admitting: Nurse Practitioner

## 2023-05-05 ENCOUNTER — Encounter: Payer: Self-pay | Admitting: Nurse Practitioner

## 2023-05-05 ENCOUNTER — Ambulatory Visit: Payer: Medicare (Managed Care) | Admitting: Nurse Practitioner

## 2023-05-05 VITALS — BP 124/78 | HR 57 | Temp 97.9°F | Ht 64.0 in | Wt 188.4 lb

## 2023-05-05 DIAGNOSIS — Z794 Long term (current) use of insulin: Secondary | ICD-10-CM

## 2023-05-05 DIAGNOSIS — E1140 Type 2 diabetes mellitus with diabetic neuropathy, unspecified: Secondary | ICD-10-CM | POA: Diagnosis not present

## 2023-05-05 DIAGNOSIS — Z0000 Encounter for general adult medical examination without abnormal findings: Secondary | ICD-10-CM

## 2023-05-05 NOTE — Patient Instructions (Addendum)
  Brittany Klein , Thank you for taking time to come for your Medicare Wellness Visit. I appreciate your ongoing commitment to your health goals. Please review the following plan we discussed and let me know if I can assist you in the future.   Mammogram and bone density through GYN   This is a list of the screening recommended for you and due dates:  Health Maintenance  Topic Date Due   Yearly kidney health urinalysis for diabetes  04/29/2023   Complete foot exam   04/05/2023   Zoster (Shingles) Vaccine (1 of 2) 03/05/2024*   COVID-19 Vaccine (8 - 2024-25 season) 05/22/2023   Flu Shot  08/22/2023   Hemoglobin A1C  09/04/2023   Yearly kidney function blood test for diabetes  03/06/2024   Eye exam for diabetics  04/03/2024   Medicare Annual Wellness Visit  05/04/2024   DTaP/Tdap/Td vaccine (2 - Td or Tdap) 07/19/2024   Mammogram  10/24/2024   Colon Cancer Screening  06/23/2028   Pneumonia Vaccine  Completed   DEXA scan (bone density measurement)  Completed   Hepatitis C Screening  Completed   HPV Vaccine  Aged Out   Meningitis B Vaccine  Aged Out  *Topic was postponed. The date shown is not the original due date.

## 2023-05-05 NOTE — Progress Notes (Signed)
 Subjective:   Brittany Klein is a 68 y.o. female who presents for Medicare Annual (Subsequent) preventive examination.  Visit Complete: In person AWS  Cardiac Risk Factors include: advanced age (>3men, >7 women);diabetes mellitus;dyslipidemia;obesity (BMI >30kg/m2);hypertension     Objective:    Today's Vitals   05/05/23 0753  BP: 124/78  Pulse: (!) 57  Temp: 97.9 F (36.6 C)  TempSrc: Temporal  SpO2: 99%  Weight: 188 lb 6.4 oz (85.5 kg)  Height: 5\' 4"  (1.626 m)   Body mass index is 32.34 kg/m.     05/05/2023    7:54 AM 11/04/2022    8:59 AM 07/05/2022    9:24 AM 04/29/2022   11:00 AM 04/05/2022    9:13 AM 02/01/2022    8:51 AM 10/05/2021    9:20 AM  Advanced Directives  Does Patient Have a Medical Advance Directive? Yes Yes No No No No No  Type of Advance Directive Out of facility DNR (pink MOST or yellow form) Out of facility DNR (pink MOST or yellow form)       Does patient want to make changes to medical advance directive? No - Patient declined No - Patient declined       Would patient like information on creating a medical advance directive?   No - Patient declined No - Patient declined No - Patient declined No - Patient declined Yes (MAU/Ambulatory/Procedural Areas - Information given)  Pre-existing out of facility DNR order (yellow form or pink MOST form) Pink MOST form placed in chart (order not valid for inpatient use) Pink MOST form placed in chart (order not valid for inpatient use)         Current Medications (verified) Outpatient Encounter Medications as of 05/05/2023  Medication Sig   aspirin EC 81 MG tablet Take 81 mg by mouth daily.   atorvastatin (LIPITOR) 40 MG tablet Take 1 tablet (40 mg total) by mouth daily.   Blood Glucose Monitoring Suppl (ONETOUCH VERIO REFLECT) w/Device KIT 1 Device by Does not apply route in the morning and at bedtime.   glucose blood (ONETOUCH VERIO) test strip Use as instructed   ketoconazole (NIZORAL) 2 % shampoo Apply 1  Application topically 2 (two) times a week.   metFORMIN (GLUCOPHAGE) 1000 MG tablet TAKE 1 TABLET (1,000 MG TOTAL) BY MOUTH TWICE A DAY WITH FOOD   Semaglutide, 1 MG/DOSE, (OZEMPIC, 1 MG/DOSE,) 2 MG/1.5ML SOPN Inject 1 mg into the skin once a week.   No facility-administered encounter medications on file as of 05/05/2023.    Allergies (verified) Morphine and codeine   History: Past Medical History:  Diagnosis Date   Anemia    Arthritis    "knees, hands" (08/04/2012)   Chronic lower back pain    "all the time" (08/04/2012)   Dysrhythmia    Tachycardia, comes and goes. Increases with activity   History of blood transfusion 01/21/2002   "related to knee replacement" (08/04/2012)   History of blood transfusion    Hyperlipemia    Tachycardia    Type II diabetes mellitus [facility])    Past Surgical History:  Procedure Laterality Date   CARPAL TUNNEL RELEASE Left 09/21/1988   HERNIA REPAIR  01/21/1998   umbilical   ROTATOR CUFF REPAIR Right 03/25/2022   TOTAL KNEE ARTHROPLASTY Left 10/22/2002   TOTAL KNEE REVISION Left 08/04/2012   TOTAL KNEE REVISION Left 08/04/2012   Procedure: LEFT TOTAL KNEE REVISION;  Surgeon: Cammy Copa, MD;  Location: [facility] OR;  Service: Orthopedics;  Laterality: Left;   TUBAL LIGATION  01/21/1978   Family History  Problem Relation Age of Onset   Diabetes Mother    Cancer Mother    Dementia Mother    Diabetes Father    Cancer Sister    Heart disease Sister    Diabetes Sister    Hypertension Sister    Stroke Sister    Diabetes Sister    Cancer Sister 17       lung   Diabetes Sister    Diabetes Sister    Diabetes Mellitus I Daughter    Social History   Socioeconomic History   Marital status: Single    Spouse name: Not on file   Number of children: Not on file   Years of education: Not on file   Highest education level: Not on file  Occupational History   Not on file  Tobacco Use   Smoking status: Former    Current packs/day: 0.00     Average packs/day: 1 pack/day for 23.0 years (23.0 ttl pk-yrs)    Types: Cigarettes    Start date: 12/01/1975    Quit date: 12/01/1998    Years since quitting: 24.4   Smokeless tobacco: Never  Vaping Use   Vaping status: Never Used  Substance and Sexual Activity   Alcohol use: Not Currently    Comment: 08/05/2012 "don't drink anymore; stopped ~ 11/1998; never had problem w/it"   Drug use: No   Sexual activity: Not on file  Other Topics Concern   Not on file  Social History Narrative   Tobacco use, amount per day now:   Past tobacco use, amount per day: 1 pack a day 40 years ago.   How many years did you use tobacco:   Alcohol use (drinks per week):   Diet:   Do you drink/eat things with caffeine: Coffee 1 cup a day.   Marital status:  Single                                What year were you married?   Do you live in a house, apartment, assisted living, condo, trailer, etc.? Apartment    Is it one or more stories? 15 stairs.   How many persons live in your home? 2   Do you have pets in your home?( please list)  None.   Highest Level of education completed? 12 grade.   Current or past profession: Personnel officer.   Do you exercise?  Monday-Friday                                Type and how often? [employer] 45 minutes.   Do you have a living will? No   Do you have a DNR form?    No                               If not, do you want to discuss one?   Do you have signed POA/HPOA forms?   No                     If so, please bring to you appointment      Do you have any difficulty bathing or dressing yourself? No   Do you have any difficulty preparing food or eating?  No   Do you have any difficulty managing your medications? No   Do you have any difficulty managing your finances? No   Do you have any difficulty affording your medications? No   Social Drivers of Corporate investment banker Strain: Not on file  Food Insecurity: No Food Insecurity (04/25/2020)   Received from [facility]   Hunger Vital Sign    Worried About Running Out of Food in the Last Year: Never true    Ran Out of Food in the Last Year: Never true  Transportation Needs: Not on file  Physical Activity: Not on file  Stress: Not on file  Social Connections: Unknown (06/05/2021)   [facility] from [facility]   Social Network    Social Network: Not on file    Tobacco Counseling Counseling given: Not Answered   Clinical Intake:  Pre-visit preparation completed: Yes  Pain : No/denies pain     BMI - recorded: 32.34 Nutritional Status: BMI > 30  Obese Nutritional Risks: None  How often do you need to have someone help you when you read instructions, pamphlets, or other written materials from your doctor or pharmacy?: 1 - Never         Activities of Daily Living    05/05/2023    9:46 AM  In your present state of health, do you have any difficulty performing the following activities:  Hearing? 0  Vision? 0  Difficulty concentrating or making decisions? 0  Walking or climbing stairs? 0  Dressing or bathing? 0  Doing errands, shopping? 0  Preparing Food and eating ? N  Using the Toilet? N  In the past six months, have you accidently leaked urine? Y  Do you have problems with loss of bowel control? N  Managing your Medications? N  Managing your Finances? N  Housekeeping or managing your Housekeeping? N    Patient Care Team: [name], NP as PCP - General (Geriatric Medicine) Zelphia Cairo, MD as Consulting Physician (Obstetrics and Gynecology) Glyn Ade, PA-C as Physician Assistant (Dermatology)  Indicate any recent Medical Services you may have received from other than [facility] providers in the past year (date may be approximate).     Assessment:   This is a routine wellness examination for [place].  Hearing/Vision screen Hearing Screening - Comments:: No hearing issues  Vision Screening - Comments:: Last eye exam less than  12 months ago with Dr.[facility]    Goals Addressed   None    Depression Screen    05/05/2023    9:30 AM 03/07/2023    9:27 AM 04/29/2022   11:01 AM 04/29/2022   11:00 AM 04/05/2022    9:13 AM 10/05/2021    9:19 AM 03/19/2021    8:37 AM  PHQ 2/9 Scores  PHQ - 2 Score 0 0 0 0 0 0 0  PHQ- 9 Score   0        Fall Risk    05/05/2023    9:30 AM 03/07/2023    9:27 AM 07/05/2022    9:23 AM 04/29/2022   11:00 AM 04/05/2022    9:13 AM  Fall Risk   Falls in the past year? 1 1 0 0 0  Comment Tripped over dog      Number falls in past yr: 0 0 0 0 0  Injury with Fall? 1 1 0 0 0  Comment Left Elbow Injury      Risk for fall  due to : Other (Comment) Other (Comment) No Fall Risks  No Fall Risks  Risk for fall due to: Comment  Dog knocked patient down.     Follow up Falls evaluation completed Falls evaluation completed Falls evaluation completed  Falls evaluation completed    MEDICARE RISK AT HOME:    TIMED UP AND GO:  Was the test performed?  No    Cognitive Function:    04/29/2022   11:02 AM  MMSE - Mini Mental State Exam  Orientation to time 4  Orientation to Place 5  Registration 3  Attention/ Calculation 5  Recall 2  Language- name 2 objects 2  Language- repeat 1  Language- follow 3 step command 3  Language- read & follow direction 1  Write a sentence 1  Copy design 1  Total score 28        05/05/2023    9:35 AM  6CIT Screen  What Year? 0 points  What month? 0 points  What time? 0 points  Count back from 20 0 points  Months in reverse 4 points  Repeat phrase 4 points  Total Score 8 points    Immunizations Immunization History  Administered Date(s) Administered   Fluad Quad(high Dose 65+) 10/05/2021   Fluad Trivalent(High Dose 65+) 11/04/2022   Influenza,inj,Quad PF,6+ Mos 10/17/2017, 09/30/2018   Moderna Covid-19 Fall Seasonal Vaccine 73yrs & older 11/02/2021   PFIZER(Purple Top)SARS-COV-2 Vaccination 01/20/2019, 02/09/2019, 11/20/2019   PNEUMOCOCCAL CONJUGATE-20  04/29/2022   Pfizer Covid-19 Vaccine Bivalent Booster 71yrs & up 07/19/2020, 12/07/2020   Pneumococcal Polysaccharide-23 12/28/2018, 06/29/2020   Tdap 07/20/2014   Unspecified SARS-COV-2 Vaccination 11/22/2022    TDAP status: Up to date  Flu Vaccine status: Up to date  Pneumococcal vaccine status: Up to date  Covid-19 vaccine status: Information provided on how to obtain vaccines.   Qualifies for Shingles Vaccine? Yes   Zostavax completed No   Shingrix Completed?: Yes  Screening Tests Health Maintenance  Topic Date Due   Diabetic kidney evaluation - Urine ACR  04/29/2023   Zoster Vaccines- Shingrix (1 of 2) 03/05/2024 (Originally 08/25/2006)   COVID-19 Vaccine (8 - 2024-25 season) 05/22/2023   INFLUENZA VACCINE  08/22/2023   HEMOGLOBIN A1C  09/04/2023   Diabetic kidney evaluation - eGFR measurement  03/06/2024   OPHTHALMOLOGY EXAM  04/03/2024   FOOT EXAM  05/04/2024   Medicare Annual Wellness (AWV)  05/04/2024   DTaP/Tdap/Td (2 - Td or Tdap) 07/19/2024   MAMMOGRAM  10/24/2024   Colonoscopy  06/23/2028   Pneumonia Vaccine 59+ Years old  Completed   DEXA SCAN  Completed   Hepatitis C Screening  Completed   HPV VACCINES  Aged Out   Meningococcal B Vaccine  Aged Out    Health Maintenance  Health Maintenance Due  Topic Date Due   Diabetic kidney evaluation - Urine ACR  04/29/2023    Colorectal cancer screening: Type of screening: Colonoscopy. Completed 2020. Repeat every 10 years  Mammogram status: Completed 10/25/2022. Repeat every year  Bone Density status: Completed 05/2022. Results reflect: Bone density results: NORMAL. Repeat every 5 years.  Lung Cancer Screening: (Low Dose CT Chest recommended if Age 11-80 years, 20 pack-year currently smoking OR have quit w/in 15years.) does not qualify.   Lung Cancer Screening Referral: na Additional Screening:  Hepatitis C Screening: does qualify; Completed na  Vision Screening: Recommended annual ophthalmology exams for  early detection of glaucoma and other disorders of the eye. Is the patient up to date with their  annual eye exam?  Yes  Who is the provider or what is the name of the office in which the patient attends annual eye exams? [facility] If pt is not established with a provider, would they like to be referred to a provider to establish care? No .   Dental Screening: Recommended annual dental exams for proper oral hygiene  Diabetic Foot Exam: Diabetic Foot Exam: Completed today  Community Resource Referral / Chronic Care Management: CRR required this visit?  No   CCM required this visit?  No     Plan:     I have personally reviewed and noted the following in the patient's chart:   Medical and social history Use of alcohol, tobacco or illicit drugs  Current medications and supplements including opioid prescriptions. Patient is not currently taking opioid prescriptions. Functional ability and status Nutritional status Physical activity Advanced directives List of other physicians Hospitalizations, surgeries, and ER visits in previous 12 months Vitals Screenings to include cognitive, depression, and falls Referrals and appointments  In addition, I have reviewed and discussed with patient certain preventive protocols, quality metrics, and best practice recommendations. A written personalized care plan for preventive services as well as general preventive health recommendations were provided to patient.     Verma Gobble, NP   05/05/2023

## 2023-05-06 ENCOUNTER — Encounter: Payer: Self-pay | Admitting: Nurse Practitioner

## 2023-05-06 LAB — MICROALBUMIN / CREATININE URINE RATIO
Creatinine, Urine: 103 mg/dL (ref 20–275)
Microalb Creat Ratio: 3 mg/g{creat} (ref <30)
Microalb, Ur: 0.3 mg/dL

## 2023-05-12 ENCOUNTER — Other Ambulatory Visit: Payer: Self-pay | Admitting: Nurse Practitioner

## 2023-05-12 ENCOUNTER — Encounter: Payer: Self-pay | Admitting: Nurse Practitioner

## 2023-05-12 DIAGNOSIS — E119 Type 2 diabetes mellitus without complications: Secondary | ICD-10-CM

## 2023-06-04 DIAGNOSIS — M7022 Olecranon bursitis, left elbow: Secondary | ICD-10-CM | POA: Insufficient documentation

## 2023-06-04 DIAGNOSIS — M25522 Pain in left elbow: Secondary | ICD-10-CM | POA: Insufficient documentation

## 2023-06-25 DIAGNOSIS — M7022 Olecranon bursitis, left elbow: Secondary | ICD-10-CM | POA: Diagnosis not present

## 2023-06-29 ENCOUNTER — Encounter: Payer: Self-pay | Admitting: Nurse Practitioner

## 2023-06-30 NOTE — Telephone Encounter (Signed)
 Noted

## 2023-07-01 DIAGNOSIS — G8918 Other acute postprocedural pain: Secondary | ICD-10-CM | POA: Insufficient documentation

## 2023-07-01 DIAGNOSIS — M7022 Olecranon bursitis, left elbow: Secondary | ICD-10-CM | POA: Diagnosis not present

## 2023-07-07 ENCOUNTER — Ambulatory Visit (INDEPENDENT_AMBULATORY_CARE_PROVIDER_SITE_OTHER): Payer: Medicare (Managed Care) | Admitting: Nurse Practitioner

## 2023-07-07 ENCOUNTER — Encounter: Payer: Self-pay | Admitting: Nurse Practitioner

## 2023-07-07 VITALS — BP 128/68 | HR 76 | Temp 98.0°F | Ht 64.0 in | Wt 184.8 lb

## 2023-07-07 DIAGNOSIS — M150 Primary generalized (osteo)arthritis: Secondary | ICD-10-CM

## 2023-07-07 DIAGNOSIS — E6609 Other obesity due to excess calories: Secondary | ICD-10-CM | POA: Diagnosis not present

## 2023-07-07 DIAGNOSIS — E1140 Type 2 diabetes mellitus with diabetic neuropathy, unspecified: Secondary | ICD-10-CM

## 2023-07-07 DIAGNOSIS — E782 Mixed hyperlipidemia: Secondary | ICD-10-CM | POA: Diagnosis not present

## 2023-07-07 DIAGNOSIS — M25522 Pain in left elbow: Secondary | ICD-10-CM | POA: Diagnosis not present

## 2023-07-07 DIAGNOSIS — E66811 Obesity, class 1: Secondary | ICD-10-CM | POA: Diagnosis not present

## 2023-07-07 DIAGNOSIS — N3281 Overactive bladder: Secondary | ICD-10-CM | POA: Insufficient documentation

## 2023-07-07 DIAGNOSIS — Z6831 Body mass index (BMI) 31.0-31.9, adult: Secondary | ICD-10-CM | POA: Diagnosis not present

## 2023-07-07 DIAGNOSIS — D649 Anemia, unspecified: Secondary | ICD-10-CM

## 2023-07-07 DIAGNOSIS — Z794 Long term (current) use of insulin: Secondary | ICD-10-CM

## 2023-07-07 NOTE — Assessment & Plan Note (Signed)
-  education provided on healthy weight loss through increase in physical activity and proper nutrition

## 2023-07-07 NOTE — Assessment & Plan Note (Signed)
 Has started lipitor, no side effects noted. Continue dietary modification

## 2023-07-07 NOTE — Progress Notes (Signed)
 Careteam: Patient Care Team: Verma Gobble, NP as PCP - General (Geriatric Medicine) Ashby Lawman, MD as Consulting Physician (Obstetrics and Gynecology) Dorthey Gave, PA-C as Physician Assistant (Dermatology)  PLACE OF SERVICE:  [facility]  Advanced Directive information    Allergies  Allergen Reactions   Morphine And Codeine Itching    Chief Complaint  Patient presents with   Medical Management of Chronic Issues    Medical Management of Chronic Issues. 4 Month follow up. To discuss need for Covid    HPI:  Discussed the use of AI scribe software for clinical note transcription with the patient, who gave verbal consent to proceed.  History of Present Illness Brittany Klein is a 68 year old female who presents for a routine follow-up.   She underwent an olecranon bursectomy on July 01, 2023, due to persistent bursitis in her elbow that was unresponsive to conservative treatments such as fluid drainage, steroid injections, and compression sleeves. Pain is manageable with Tylenol . No significant constipation post-surgery. She is scheduled to receive a splint.  She has a history of overactive bladder and had considered restarting pelvic floor therapy, but this is currently on hold due to her recent surgery.  She is managing diabetes with Ozempic  and metformin  twice daily. She has declined the use of an ACE inhibitor for renal protection.  She has a history of anemia, with a significant drop in hemoglobin to 7 prior to her surgery. She has been anemic since her first child and cannot tolerate iron supplements due to constipation. No symptoms such as blood in stools, abnormal bruising, or bleeding are present.  She is currently taking Lipitor without any side effects and is monitoring her cholesterol levels. She is working on [employer], with a current BMI of 31, and is encouraged by recent weight loss. She is trying to maintain physical activity despite her  recent surgery, as she misses her exercise routine and social interactions.   Review of Systems:  Review of Systems  Constitutional:  Negative for chills, fever and weight loss.  HENT:  Negative for tinnitus.   Respiratory:  Negative for cough, sputum production and shortness of breath.   Cardiovascular:  Negative for chest pain, palpitations and leg swelling.  Gastrointestinal:  Negative for abdominal pain, constipation, diarrhea and heartburn.  Genitourinary:  Negative for dysuria, frequency and urgency.  Musculoskeletal:  Negative for back pain, falls, joint pain and myalgias.  Skin: Negative.   Neurological:  Negative for dizziness and headaches.  Psychiatric/Behavioral:  Negative for depression and memory loss. The patient does not have insomnia.     Past Medical History:  Diagnosis Date   Anemia    Arthritis    knees, hands (08/04/2012)   Chronic lower back pain    all the time (08/04/2012)   Dysrhythmia    Tachycardia, comes and goes. Increases with activity   History of blood transfusion 01/21/2002   related to knee replacement (08/04/2012)   History of blood transfusion    Hyperlipemia    Tachycardia    Type II diabetes mellitus [facility])    Past Surgical History:  Procedure Laterality Date   CARPAL TUNNEL RELEASE Left 09/21/1988   HERNIA REPAIR  01/21/1998   umbilical   ROTATOR CUFF REPAIR Right 03/25/2022   TOTAL KNEE ARTHROPLASTY Left 10/22/2002   TOTAL KNEE REVISION Left 08/04/2012   TOTAL KNEE REVISION Left 08/04/2012   Procedure: LEFT TOTAL KNEE REVISION;  Surgeon: Jasmine Mesi, MD;  Location:  [facility] OR;  Service: Orthopedics;  Laterality: Left;   TUBAL LIGATION  01/21/1978   Social History:   reports that she quit smoking about 24 years ago. Her smoking use included cigarettes. She started smoking about 47 years ago. She has a 23 pack-year smoking history. She has never used smokeless tobacco. She reports that she does not currently use alcohol. She  reports that she does not use drugs.  Family History  Problem Relation Age of Onset   Diabetes Mother    Cancer Mother    Dementia Mother    Diabetes Father    Cancer Sister    Heart disease Sister    Diabetes Sister    Hypertension Sister    Stroke Sister    Diabetes Sister    Cancer Sister 52       lung   Diabetes Sister    Diabetes Sister    Diabetes Mellitus I Daughter     Medications: Patient's Medications  New Prescriptions   No medications on file  Previous Medications   ASPIRIN  EC 81 MG TABLET    Take 81 mg by mouth daily.   ATORVASTATIN  (LIPITOR) 40 MG TABLET    Take 1 tablet (40 mg total) by mouth daily.   BLOOD GLUCOSE MONITORING SUPPL (ONETOUCH VERIO REFLECT) W/DEVICE KIT    1 Device by Does not apply route in the morning and at bedtime.   GLUCOSE BLOOD (ONETOUCH VERIO) TEST STRIP    Use as instructed   KETOCONAZOLE  (NIZORAL ) 2 % SHAMPOO    Apply 1 Application topically 2 (two) times a week.   METFORMIN  (GLUCOPHAGE ) 1000 MG TABLET    TAKE 1 TABLET BY MOUTH TWICE DAILY WITH FOOD   SEMAGLUTIDE , 1 MG/DOSE, (OZEMPIC , 1 MG/DOSE,) 2 MG/1.5ML SOPN    Inject 1 mg into the skin once a week.  Modified Medications   No medications on file  Discontinued Medications   No medications on file    Physical Exam:  Vitals:   07/07/23 0917  BP: 128/68  Pulse: 76  Temp: 98 F (36.7 C)  SpO2: 97%  Weight: 184 lb 12.8 oz (83.8 kg)  Height: 5' 4 (1.626 m)   Body mass index is 31.72 kg/m. Wt Readings from Last 3 Encounters:  07/07/23 184 lb 12.8 oz (83.8 kg)  05/05/23 188 lb 6.4 oz (85.5 kg)  03/07/23 189 lb (85.7 kg)    Physical Exam Constitutional:      General: She is not in acute distress.    Appearance: She is well-developed. She is not diaphoretic.  HENT:     Head: Normocephalic and atraumatic.     Mouth/Throat:     Pharynx: No oropharyngeal exudate.   Eyes:     Conjunctiva/sclera: Conjunctivae normal.     Pupils: Pupils are equal, round, and reactive  to light.    Cardiovascular:     Rate and Rhythm: Normal rate and regular rhythm.     Heart sounds: Normal heart sounds.  Pulmonary:     Effort: Pulmonary effort is normal.     Breath sounds: Normal breath sounds.  Abdominal:     General: Bowel sounds are normal.     Palpations: Abdomen is soft.   Musculoskeletal:     Cervical back: Normal range of motion and neck supple.     Right lower leg: No edema.     Left lower leg: No edema.     Comments: Left arm in sling with brace   Skin:  General: Skin is warm and dry.   Neurological:     Mental Status: She is alert and oriented to person, place, and time.   Psychiatric:        Mood and Affect: Mood normal.     Labs reviewed: Basic Metabolic Panel: Recent Labs    11/04/22 0916 03/07/23 1036  NA 140 138  K 4.8 5.4*  CL 103 104  CO2 28 27  GLUCOSE 107* 97  BUN 20 16  CREATININE 1.06* 0.96  CALCIUM  9.3 9.5   Liver Function Tests: Recent Labs    11/04/22 0916 03/07/23 1036  AST 14 13  ALT 12 9  BILITOT 0.4 0.3  PROT 6.7 6.9   No results for input(s): LIPASE, AMYLASE in the last 8760 hours. No results for input(s): AMMONIA in the last 8760 hours. CBC: Recent Labs    11/04/22 0916 03/07/23 1036  WBC 6.4 6.2  NEUTROABS 3,219 3,224  HGB 9.4* 9.6*  HCT 30.8* 30.6*  MCV 85.6 85.0  PLT 302 339   Lipid Panel: Recent Labs    11/04/22 0916  CHOL 158  HDL 60  LDLCALC 81  TRIG 88  CHOLHDL 2.6   TSH: No results for input(s): TSH in the last 8760 hours. A1C: Lab Results  Component Value Date   HGBA1C 6.6 (H) 03/07/2023     Assessment/Plan Type 2 diabetes mellitus with diabetic neuropathy, with long-term current use of insulin  (HCC) Assessment & Plan: Encouraged dietary compliance, routine foot care/monitoring and to keep up with diabetic eye exams through ophthalmology  Continues on ozempic  and metformin   Orders: -     COMPLETE METABOLIC PANEL WITHOUT GFR -     CBC with  Differential/Platelet -     Lipid panel -     Hemoglobin A1c  Primary osteoarthritis involving multiple joints Assessment & Plan: Ongoing and stable, continues tylenol  PRN   Mixed hyperlipidemia Assessment & Plan: Has started lipitor, no side effects noted. Continue dietary modification   Orders: -     COMPLETE METABOLIC PANEL WITHOUT GFR -     Lipid panel  Class 1 obesity due to excess calories with serious comorbidity and body mass index (BMI) of 31.0 to 31.9 in adult Assessment & Plan: -education provided on healthy weight loss through increase in physical activity and proper nutrition    Anemia, unspecified type Assessment & Plan: Chronic, will follow up post op cbc  Orders: -     CBC with Differential/Platelet  OAB (overactive bladder) Assessment & Plan: Ongoing, does not wish to have pelvic floor exercises at this time but discussed option in the future.       Return in about 6 months (around 01/06/2024) for routine follow up.  Robertta Halfhill K. Denney Fisherman [facility] [phone]

## 2023-07-07 NOTE — Assessment & Plan Note (Signed)
 Chronic, will follow up post op cbc

## 2023-07-07 NOTE — Assessment & Plan Note (Signed)
 Encouraged dietary compliance, routine foot care/monitoring and to keep up with diabetic eye exams through ophthalmology  Continues on ozempic  and metformin 

## 2023-07-07 NOTE — Assessment & Plan Note (Signed)
 Ongoing, does not wish to have pelvic floor exercises at this time but discussed option in the future.

## 2023-07-07 NOTE — Assessment & Plan Note (Signed)
 Ongoing and stable, continues tylenol  PRN

## 2023-07-08 ENCOUNTER — Ambulatory Visit: Payer: Self-pay | Admitting: Nurse Practitioner

## 2023-07-08 LAB — CBC WITH DIFFERENTIAL/PLATELET
Absolute Lymphocytes: 2670 {cells}/uL (ref 850–3900)
Absolute Monocytes: 428 {cells}/uL (ref 200–950)
Basophils Absolute: 30 {cells}/uL (ref 0–200)
Basophils Relative: 0.4 %
Eosinophils Absolute: 143 {cells}/uL (ref 15–500)
Eosinophils Relative: 1.9 %
HCT: 31.4 % — ABNORMAL LOW (ref 35.0–45.0)
Hemoglobin: 9.5 g/dL — ABNORMAL LOW (ref 11.7–15.5)
MCH: 26.6 pg — ABNORMAL LOW (ref 27.0–33.0)
MCHC: 30.3 g/dL — ABNORMAL LOW (ref 32.0–36.0)
MCV: 88 fL (ref 80.0–100.0)
MPV: 9.8 fL (ref 7.5–12.5)
Monocytes Relative: 5.7 %
Neutro Abs: 4230 {cells}/uL (ref 1500–7800)
Neutrophils Relative %: 56.4 %
Platelets: 300 10*3/uL (ref 140–400)
RBC: 3.57 10*6/uL — ABNORMAL LOW (ref 3.80–5.10)
RDW: 12.8 % (ref 11.0–15.0)
Total Lymphocyte: 35.6 %
WBC: 7.5 10*3/uL (ref 3.8–10.8)

## 2023-07-08 LAB — HEMOGLOBIN A1C
Hgb A1c MFr Bld: 6.5 % — ABNORMAL HIGH (ref <5.7)
Mean Plasma Glucose: 140 mg/dL
eAG (mmol/L): 7.7 mmol/L

## 2023-07-08 LAB — COMPLETE METABOLIC PANEL WITHOUT GFR
AG Ratio: 1.6 (calc) (ref 1.0–2.5)
ALT: 9 U/L (ref 6–29)
AST: 11 U/L (ref 10–35)
Albumin: 3.9 g/dL (ref 3.6–5.1)
Alkaline phosphatase (APISO): 86 U/L (ref 37–153)
BUN: 23 mg/dL (ref 7–25)
CO2: 28 mmol/L (ref 20–32)
Calcium: 9.3 mg/dL (ref 8.6–10.4)
Chloride: 103 mmol/L (ref 98–110)
Creat: 1.01 mg/dL (ref 0.50–1.05)
Globulin: 2.5 g/dL (ref 1.9–3.7)
Glucose, Bld: 110 mg/dL — ABNORMAL HIGH (ref 65–99)
Potassium: 4.9 mmol/L (ref 3.5–5.3)
Sodium: 139 mmol/L (ref 135–146)
Total Bilirubin: 0.4 mg/dL (ref 0.2–1.2)
Total Protein: 6.4 g/dL (ref 6.1–8.1)

## 2023-07-08 LAB — LIPID PANEL
Cholesterol: 159 mg/dL (ref <200)
HDL: 53 mg/dL (ref >=50)
LDL Cholesterol (Calc): 85 mg/dL
Non-HDL Cholesterol (Calc): 106 mg/dL (ref <130)
Total CHOL/HDL Ratio: 3 (calc) (ref <5.0)
Triglycerides: 117 mg/dL (ref <150)

## 2023-07-21 ENCOUNTER — Telehealth: Payer: Self-pay | Admitting: *Deleted

## 2023-07-21 NOTE — Telephone Encounter (Signed)
 Received fax from [employer] #[phone] for Patient Assistance for Ozempic .   Filled out and placed in Jessica's folder to review and sign.   To be faxed back to [employer] once completed Fax: [phone]  [employer] Patient ID: [id number]

## 2023-07-22 NOTE — Telephone Encounter (Signed)
 Spoke with patient and informed her that Harlene will not be in office again until Friday to sign paperwork, however we can provide her with a sample.

## 2023-07-22 NOTE — Telephone Encounter (Signed)
 Copied from CRM [id number]. Topic: Clinical - Medication Question >> Jul 22, 2023 11:31 AM Diannia H wrote: Reason for CRM: Patient called and she is wanting a update on her forms and fax for her medicine Ozempic , informed patient is was received and awaiting the providers signature and to fax it back. Patient is wanting to know can a rush be put on it because she missed it Sunday and hasn't had it since. Patients callback is [phone].

## 2023-07-23 NOTE — Telephone Encounter (Signed)
Patient came in and picked up samples

## 2023-08-06 ENCOUNTER — Encounter: Payer: Self-pay | Admitting: Dermatology

## 2023-08-06 ENCOUNTER — Ambulatory Visit (INDEPENDENT_AMBULATORY_CARE_PROVIDER_SITE_OTHER): Payer: Medicare (Managed Care) | Admitting: Dermatology

## 2023-08-06 VITALS — BP 117/76 | HR 90

## 2023-08-06 DIAGNOSIS — L299 Pruritus, unspecified: Secondary | ICD-10-CM | POA: Diagnosis not present

## 2023-08-06 DIAGNOSIS — D485 Neoplasm of uncertain behavior of skin: Secondary | ICD-10-CM

## 2023-08-06 DIAGNOSIS — L6681 Central centrifugal cicatricial alopecia: Secondary | ICD-10-CM | POA: Diagnosis not present

## 2023-08-06 NOTE — Patient Instructions (Signed)

## 2023-08-06 NOTE — Progress Notes (Signed)
   Follow-Up Visit   Subjective  Brittany Klein is a 68 y.o. female who presents for the following: Seb Derm  Patient present today for follow up visit for Seb Derm . Patient was last evaluated on 04/17/22. At this visit patient was prescribed Derma Smoothe Scalp oil and recommended to wash hair with DHS Zinc Shampoo. Patient reports sxs were unchanged. Patient denies medication changes. Patient rates her itch 10 out of 10. She also has burning associated. She reports she washes her hair once weekly. Patient states she does not have flakes but just excessive itching. She states she tried to take benadryl  for 1 month straight and the itching was still present.  The following portions of the chart were reviewed this encounter and updated as appropriate: medications, allergies, medical history  Review of Systems:  No other skin or systemic complaints except as noted in HPI or Assessment and Plan.  Objective  Well appearing patient in no apparent distress; mood and affect are within normal limits.  A focused examination was performed of the following areas: Scalp  Relevant exam findings are noted in the Assessment and Plan.           Assessment & Plan   1. Pruritus of scalp - Assessment: Patient presents with persistent itchy scalp, primarily in the crown area, with a history of seborrheic dermatitis and hair loss. Previous treatments including antifungal medications (fluconazole ), topical oils (dermis smooth oil), and other unspecified medications (colbatazole) have been ineffective, suggesting deeper and more intense inflammation. 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 A biopsy is necessary to confirm the diagnosis and guide further treatment.  - Plan:    Perform scalp biopsy in the crown area     - Patient education provided on procedure: pinch and burn sensation, small incision requiring a stitch      - Informed consent obtained; risks, benefits, and alternatives discussed    Schedule follow-up appointment in 2 weeks for:     - Stitch removal     - Review biopsy results     - Discuss treatment plan based on findings  Follow-up in 2 weeks for stitch removal, biopsy results review, and treatment plan discussion.    Return in about 2 weeks (around 08/20/2023) for Bx Results and Suture Removal DB Schedule at  1:45, 2:15 acne or wart ok'd by JD .  I, Jetta Ager, am acting as Neurosurgeon for [employer], DO.  Documentation: I have reviewed the above documentation for accuracy and completeness, and I agree with the above.  Delon Lenis, DO

## 2023-08-08 LAB — SURGICAL PATHOLOGY

## 2023-08-12 ENCOUNTER — Ambulatory Visit: Payer: Self-pay | Admitting: Dermatology

## 2023-08-18 ENCOUNTER — Encounter: Payer: Self-pay | Admitting: Dermatology

## 2023-08-21 ENCOUNTER — Ambulatory Visit: Payer: Medicare (Managed Care) | Admitting: Dermatology

## 2023-08-21 DIAGNOSIS — Z79899 Other long term (current) drug therapy: Secondary | ICD-10-CM | POA: Diagnosis not present

## 2023-08-21 DIAGNOSIS — L6681 Central centrifugal cicatricial alopecia: Secondary | ICD-10-CM

## 2023-08-21 DIAGNOSIS — L700 Acne vulgaris: Secondary | ICD-10-CM

## 2023-08-21 DIAGNOSIS — L669 Cicatricial alopecia, unspecified: Secondary | ICD-10-CM

## 2023-08-21 MED ORDER — TRIAMCINOLONE ACETONIDE 10 MG/ML IJ SUSP
10.0000 mg | Freq: Once | INTRAMUSCULAR | Status: AC
  Administered 2023-08-21: 10 mg via INTRADERMAL

## 2023-08-21 MED ORDER — CLOBETASOL PROPIONATE 0.05 % EX SOLN: 1.0000 | Freq: Every day | CUTANEOUS | 0 refills | Status: AC

## 2023-08-21 NOTE — Progress Notes (Signed)
 Follow-Up Visit   Subjective  Brittany Klein is a 68 y.o. 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  Objective  Well appearing patient in no apparent distress; mood and affect are within normal limits.  Areas Examined: Scalp  Relevant physical exam findings are noted in the Assessment and Plan.  Scalp Areas of hair loss with loss of follicular ostia.   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 There is focal atrophy of the follicular epithelium. There are hints of early inner sheath desquamatization. No hyphae are seen on a PAS stain and no deep dermal interfollicular scarring is demonstrated on an elastic tissue stain. These findings are consistent with an early phase in central centrifugal scarring alopecia. The major differential diagnosis is lichen planopilaris, but with the lack of more prominent interface change, that is thought to be unlikely. The features are not well developed or demonstrated in the biopsy, but the diagnosis above is thought to be the most likely interpretation of these sections. This appears to be an early lesion. The slides have also been reviewed by Dr. Lorriane, who is in general agreement with the diagnosis. 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 Physical examination revealed thinning, and biopsy confirmed CCCA diagnosis. Condition characterized by chronic itching, inflammation, and fibrosis leading to hair loss. CCCA is genetic and can be exacerbated by certain hair care practices. Current focus is on controlling inflammation to prevent permanent hair loss, as topical treatments have been ineffective. - Plan:    Administer intralesional injections of clobetasol  every 12 weeks    Prescribe clobetasol  drops for home use, focusing on itchy areas    Use DHS zinc shampoo to help with inflammation    Inject 1 cc of Kenola 10 during this visit    Follow up in 3 months for repeat injections    Continue topical treatments as needed    Educate patient on:     - Genetic nature of the condition     - Potential exacerbation by certain hair care practices     - Importance of controlling inflammation to maintain hair     - Possible side effects: headaches or tenderness after injections     - Small scab from injection will fall off on its own  2. Blackheads - Assessment: Patient presents with blackheads, which are dilated pores with trapped oil. Not dangerous but can be cosmetically bothersome. - Plan:    Perform manual extraction of blackheads during visit    Educate patient that blackheads may refill over time  Follow-up in 3 months for repeat injections and reassessment of CCCA. SCARRING ALOPECIA Scalp Intralesional injection - Scalp Location: Scalp  Informed Consent: Discussed risks (infection, pain, bleeding, bruising, thinning of the skin, loss of skin pigment, lack of resolution, and recurrence of lesion) and benefits of the procedure, as well as the alternatives. Informed  consent was obtained. Preparation: The area was prepared a standard fashion.  Anesthesia:None  Procedure Details: An intralesional injection was performed  with Kenalog  10 mg/cc. 1 cc in total were injected.  Total number of injections: 12  Plan: The patient was instructed on post-op care. Recommend OTC analgesia as needed for pain.  WIR:[id number] ONU:[id number] EXP:04/20/2024  triamcinolone  acetonide (KENALOG ) 10 MG/ML injection 10 mg - Scalp  Encounter for Removal of Sutures - Incision site is clean, dry and intact. - Wound cleansed, sutures removed, wound cleansed and steri strips applied.  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 - Scars remodel for a full year. - Once steri-strips fall off, patient can apply over-the-counter silicone scar cream once to twice a day to help with scar remodeling if desired. - Patient advised to call with any concerns or if they notice any new or changing lesions.  Central Centrifugal Cicatricial Alopecia (CCCA) Exam: Areas of hair loss with loss of follicular ostia.   Treatment Plan: -Clobetasol  scalp solution to apply topically to the scalp once a day. We will prescribe this today -Kenalog  10 injection preformed in office today -Continue with DHS Zinc  Long term medication management.  Patient is using long term (months to years) prescription medication  to control their dermatologic condition.  These medications require periodic monitoring to evaluate for efficacy and side effects and may require periodic laboratory monitoring.    No follow-ups on file.  I, Gordan Beams, CMA, am acting as scribe for [employer], DO.   Documentation: I have reviewed the above documentation for accuracy and completeness, and I agree with the above.  Delon Lenis, DO

## 2023-08-21 NOTE — Patient Instructions (Signed)
 Date: Thu Aug 21 2023  Dear Ms. Brittany Klein,  Thank you for visiting today. Here is a summary of the key instructions:  - Medications:   - Use clobetasol  drops on itchy areas of scalp   - Use DHS zinc shampoo to help with inflammation  - Treatments:   - Kenalog   injections every 12 weeks   - Received 1 cc of Kenalog  injections today  - Follow-up:   - Return in 3 months for follow-up injections  - What to Expect:   - You may experience headaches or tenderness after injections, which is normal  - Pharmacy:   - Clobetasol  drops will be sent to your regular pharmacy  If you have any questions or concerns, please do not hesitate to contact our office.  Warm regards,  Dr. Delon Lenis Dermat[facility]   Important Information  Due to recent changes in healthcare laws, you may see results of your pathology and/or laboratory studies on MyChart before the doctors have had a chance to review them. We understand that in some cases there may be results that are confusing or concerning to you. Please understand that not all results are received at the same time and often the doctors may need to interpret multiple results in order to provide you with the best plan of care or course of treatment. Therefore, we ask that you please give us  2 business days to thoroughly review all your results before contacting the office for clarification. Should we see a critical lab result, you will be contacted sooner.   If You Need Anything After Your Visit  If you have any questions or concerns for your doctor, please call our main line at [phone] If no one answers, please leave a voicemail as directed and we will return your call as soon as possible. Messages left after 4 pm will be answered the following business day.   You may also send us  a message via MyChart. We typically respond to MyChart messages within 1-2 business days.  For prescription refills, please ask your pharmacy to contact our office. Our  fax number is [phone].  If you have an urgent issue when the clinic is closed that cannot wait until the next business day, you can page your doctor at the number below.    Please note that while we do our best to be available for urgent issues outside of office hours, we are not available 24/7.   If you have an urgent issue and are unable to reach us , you may choose to seek medical care at your doctor's office, retail clinic, urgent care center, or emergency room.  If you have a medical emergency, please immediately call 911 or go to the emergency department. In the event of inclement weather, please call our main line at [phone] for an update on the status of any delays or closures.  Dermatology Medication Tips: Please keep the boxes that topical medications come in in order to help keep track of the instructions about where and how to use these. Pharmacies typically print the medication instructions only on the boxes and not directly on the medication tubes.   If your medication is too expensive, please contact our office at [phone] or send us  a message through MyChart.   We are unable to tell what your co-pay for medications will be in advance as this is different depending on your insurance coverage. However, we may be able to find a substitute medication at lower cost or fill out paperwork  to get insurance to cover a needed medication.   If a prior authorization is required to get your medication covered by your insurance company, please allow us  1-2 business days to complete this process.  Drug prices often vary depending on where the prescription is filled and some pharmacies may offer cheaper prices.  The website www.goodrx.com contains coupons for medications through different pharmacies. The prices here do not account for what the cost may be with help from insurance (it may be cheaper with your insurance), but the website can give you the price if you did not use any  insurance.  - You can print the associated coupon and take it with your prescription to the pharmacy.  - You may also stop by our office during regular business hours and pick up a GoodRx coupon card.  - If you need your prescription sent electronically to a different pharmacy, notify our office through [facility] or by phone at [phone]

## 2023-08-22 ENCOUNTER — Telehealth: Payer: Self-pay

## 2023-08-22 NOTE — Telephone Encounter (Signed)
 4 ozempic  received by mail. Spoke with patient and she stated she would pick up today.

## 2023-08-26 ENCOUNTER — Encounter: Payer: Self-pay | Admitting: Dermatology

## 2023-09-12 ENCOUNTER — Other Ambulatory Visit: Payer: Self-pay | Admitting: Obstetrics and Gynecology

## 2023-09-12 DIAGNOSIS — R928 Other abnormal and inconclusive findings on diagnostic imaging of breast: Secondary | ICD-10-CM

## 2023-09-12 DIAGNOSIS — R921 Mammographic calcification found on diagnostic imaging of breast: Secondary | ICD-10-CM

## 2023-10-13 ENCOUNTER — Telehealth: Payer: Self-pay | Admitting: Nurse Practitioner

## 2023-10-13 NOTE — Telephone Encounter (Signed)
 Forms placed into provider review and sign folder.

## 2023-10-13 NOTE — Telephone Encounter (Signed)
 Patient dropper off forms for (medication assistance) to be completed and signed by PCP (JE) requesting to pick them up after being completed and signed. Given to Jasmine in CI.

## 2023-10-15 NOTE — Telephone Encounter (Signed)
Completed and given back to CI

## 2023-10-16 NOTE — Telephone Encounter (Signed)
 As instructed, outgoing call placed to patient, and message left informing her form is completed and ready for pick-up. Copy sent to scanning as well. ~ CB ~

## 2023-11-06 ENCOUNTER — Ambulatory Visit
Admission: RE | Admit: 2023-11-06 | Discharge: 2023-11-06 | Disposition: A | Discharge status: Home / Self Care | Payer: Medicare (Managed Care) | Source: Ambulatory Visit | Attending: Obstetrics and Gynecology | Admitting: Obstetrics and Gynecology

## 2023-11-06 ENCOUNTER — Telehealth: Payer: Self-pay | Admitting: Surgical

## 2023-11-06 ENCOUNTER — Ambulatory Visit: Admission: RE | Admit: 2023-11-06 | Payer: Medicare (Managed Care) | Source: Ambulatory Visit

## 2023-11-06 DIAGNOSIS — R921 Mammographic calcification found on diagnostic imaging of breast: Secondary | ICD-10-CM

## 2023-11-06 DIAGNOSIS — R928 Other abnormal and inconclusive findings on diagnostic imaging of breast: Secondary | ICD-10-CM

## 2023-11-06 NOTE — Telephone Encounter (Signed)
 Patient called and ask if you could sent a referral to Dr. Eldonna for her Neck injection. CB#[phone]

## 2023-11-10 ENCOUNTER — Telehealth: Payer: Self-pay | Admitting: *Deleted

## 2023-11-10 NOTE — Telephone Encounter (Signed)
 Received Paperwork from [employer] Patient Assistance Program [phone] for Ozempic .   Filled out form and placed in Jessica's Folder to review and sign. To be faxed back to Fax:[phone] once completed.

## 2023-11-18 DIAGNOSIS — N958 Other specified menopausal and perimenopausal disorders: Secondary | ICD-10-CM | POA: Diagnosis not present

## 2023-11-18 DIAGNOSIS — M8588 Other specified disorders of bone density and structure, other site: Secondary | ICD-10-CM | POA: Diagnosis not present

## 2023-11-18 DIAGNOSIS — Z124 Encounter for screening for malignant neoplasm of cervix: Secondary | ICD-10-CM | POA: Diagnosis not present

## 2023-11-18 DIAGNOSIS — Z6832 Body mass index (BMI) 32.0-32.9, adult: Secondary | ICD-10-CM | POA: Diagnosis not present

## 2023-11-19 ENCOUNTER — Telehealth: Payer: Self-pay

## 2023-11-19 NOTE — Telephone Encounter (Signed)
 Patient assistance medication (Ozempic ) received via mail   I left a detailed message informing patient medication is available for pick-up, office hours provided.

## 2023-11-20 ENCOUNTER — Encounter: Payer: Self-pay | Admitting: Dermatology

## 2023-11-20 ENCOUNTER — Ambulatory Visit: Payer: Medicare (Managed Care) | Admitting: Dermatology

## 2023-11-20 VITALS — BP 160/94 | HR 71

## 2023-11-20 DIAGNOSIS — Z79899 Other long term (current) drug therapy: Secondary | ICD-10-CM | POA: Diagnosis not present

## 2023-11-20 DIAGNOSIS — L6681 Central centrifugal cicatricial alopecia: Secondary | ICD-10-CM

## 2023-11-20 DIAGNOSIS — L669 Cicatricial alopecia, unspecified: Secondary | ICD-10-CM

## 2023-11-20 MED ORDER — FLUOCINOLONE ACETONIDE BODY 0.01 % EX OIL: 1.0000 | TOPICAL_OIL | Freq: Every day | CUTANEOUS | 5 refills | Status: AC

## 2023-11-20 MED ORDER — TRIAMCINOLONE ACETONIDE 10 MG/ML IJ SUSP: 10.0000 mg | Freq: Once | INTRAMUSCULAR | Status: DC

## 2023-11-20 NOTE — Progress Notes (Signed)
   Follow-Up Visit   Subjective  Brittany Klein is a 68 y.o. female who presents for the following: CCCA   Patient was last evaluated on 08/21/23.  At this visit patient was advised to have intralesional injections of Clobetasol  every 12 weeks and was Prescribed Clobetasol  drops to use at home At this visit patient was given 12 injections of Kenalog  10mg /cc with 1cc total injected Patient was advised to use DHS Zinc Shampoo Patient reports scalp is still itchy. Rates itchiness 8/10 Patient reports no hair loss Patient reports sxs are a little bit better but still persistent Patient denies medication changes. Patient reports she is using collagen supplements daily   The following portions of the chart were reviewed this encounter and updated as appropriate: medications, allergies, medical history  Review of Systems:  No other skin or systemic complaints except as noted in HPI or Assessment and Plan.  Objective  Well appearing patient in no apparent distress; mood and affect are within normal limits.  A focused examination was performed of the following areas: Scalp  Relevant exam findings are noted in the Assessment and Plan.            Scalp Areas of hair loss with loss of follicular ostia.   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  In women androgenetic alopecia is commonly associated with menopause but may occur any time after puberty.  It causes hair thinning primarily on the crown with widening of the part and temporal hairline recession.  Can use OTC Rogaine (minoxidil) 5% solution/foam as directed.  Oral treatments in female patients who have no contraindication may include : - Low dose oral minoxidil 1.25 - 5mg  daily -  Spironolactone 50 - 100mg  bid - Finasteride 2.5 - 5 mg daily Adjunctive therapies include: - Low Level Laser Light Therapy (LLLT) - Platelet-rich plasma injections (PRP) - Hair Transplants or scalp reduction   Treatment Plan: Prescribed Derma-Smoothe  0.01%  Intralesional Injection of Kenalog  10mg /cc, a total of 1cc was injected Total injections: NDC: 9996-9505-79 ONU:[id number] EXP:03/2024    Long term medication management.  Patient is using long term (months to years) prescription medication  to control their dermatologic condition.  These medications require periodic monitoring to evaluate for efficacy and side effects and may require periodic laboratory monitoring.     SCARRING ALOPECIA Scalp Intralesional injection - Scalp Kenalog  10 mg/cc injected Total injections: Amount injected:1 cc NDC: 9996-9505-79 ONU:[id number] EXP:03/2024  triamcinolone  acetonide (KENALOG ) 10 MG/ML injection 10 mg - Scalp  Related Medications Fluocinolone  Acetonide Body 0.01 % OIL Apply 1 Application topically daily.  Return in about 4 months (around 03/20/2024) for CCCA F/U.  I, Lyle Cords, as acting as a neurosurgeon for [employer], DO .   Documentation: I have reviewed the above documentation for accuracy and completeness, and I agree with the above.  Delon Lenis, DO

## 2023-11-20 NOTE — Patient Instructions (Signed)
 Important Information  Due to recent changes in healthcare laws, you may see results of your pathology and/or laboratory studies on MyChart before the doctors have had a chance to review them. We understand that in some cases there may be results that are confusing or concerning to you. Please understand that not all results are received at the same time and often the doctors may need to interpret multiple results in order to provide you with the best plan of care or course of treatment. Therefore, we ask that you please give us  2 business days to thoroughly review all your results before contacting the office for clarification. Should we see a critical lab result, you will be contacted sooner.   If You Need Anything After Your Visit  If you have any questions or concerns for your doctor, please call our main line at [phone] If no one answers, please leave a voicemail as directed and we will return your call as soon as possible. Messages left after 4 pm will be answered the following business day.   You may also send us  a message via MyChart. We typically respond to MyChart messages within 1-2 business days.  For prescription refills, please ask your pharmacy to contact our office. Our fax number is [phone].  If you have an urgent issue when the clinic is closed that cannot wait until the next business day, you can page your doctor at the number below.    Please note that while we do our best to be available for urgent issues outside of office hours, we are not available 24/7.   If you have an urgent issue and are unable to reach us , you may choose to seek medical care at your doctor's office, retail clinic, urgent care center, or emergency room.  If you have a medical emergency, please immediately call 911 or go to the emergency department. In the event of inclement weather, please call our main line at [phone] for an update on the status of any delays or  closures.  Dermatology Medication Tips: Please keep the boxes that topical medications come in in order to help keep track of the instructions about where and how to use these. Pharmacies typically print the medication instructions only on the boxes and not directly on the medication tubes.   If your medication is too expensive, please contact our office at [phone] or send us  a message through MyChart.   We are unable to tell what your co-pay for medications will be in advance as this is different depending on your insurance coverage. However, we may be able to find a substitute medication at lower cost or fill out paperwork to get insurance to cover a needed medication.   If a prior authorization is required to get your medication covered by your insurance company, please allow us  1-2 business days to complete this process.  Drug prices often vary depending on where the prescription is filled and some pharmacies may offer cheaper prices.  The website www.goodrx.com contains coupons for medications through different pharmacies. The prices here do not account for what the cost may be with help from insurance (it may be cheaper with your insurance), but the website can give you the price if you did not use any insurance.  - You can print the associated coupon and take it with your prescription to the pharmacy.  - You may also stop by our office during regular business hours and pick up a GoodRx coupon card.  - If  you need your prescription sent electronically to a different pharmacy, notify our office through [facility] or by phone at [phone]

## 2023-11-21 ENCOUNTER — Encounter: Payer: Self-pay | Admitting: Dermatology

## 2023-11-24 ENCOUNTER — Encounter: Payer: Self-pay | Admitting: Radiology

## 2023-11-25 ENCOUNTER — Encounter: Payer: Self-pay | Admitting: Nurse Practitioner

## 2023-11-25 ENCOUNTER — Ambulatory Visit: Payer: Medicare (Managed Care) | Admitting: Physical Medicine and Rehabilitation

## 2024-01-01 ENCOUNTER — Telehealth: Payer: Self-pay | Admitting: Pharmacist

## 2024-01-01 NOTE — Progress Notes (Unsigned)
° °  01/01/2024 Name: Brittany Klein MRN: [id number] DOB: [birth year]  Pharmacy Quality Measure Review  This patient is appearing on a report for being at risk of failing the adherence measure for diabetes medications this calendar year.   Medication: Meformin 1000 mg Last fill date: 09/08/2023 for 90 day supply  Discussed barriers to adherence, which included she had a lot of pills left at one time and was not sure how things got confused.  An attempt was made to call in the refill for her but the Patient asked me not to. She said she would call it in herself next week.  [name], PharmD, BCACP Clinical Pharmacist [phone]

## 2024-01-05 ENCOUNTER — Ambulatory Visit: Payer: Medicare (Managed Care) | Admitting: Nurse Practitioner

## 2024-01-05 ENCOUNTER — Encounter: Payer: Self-pay | Admitting: Nurse Practitioner

## 2024-01-05 VITALS — BP 124/78 | HR 71 | Temp 96.8°F | Resp 18 | Ht 64.0 in | Wt 187.2 lb

## 2024-01-05 DIAGNOSIS — Z6832 Body mass index (BMI) 32.0-32.9, adult: Secondary | ICD-10-CM

## 2024-01-05 DIAGNOSIS — E782 Mixed hyperlipidemia: Secondary | ICD-10-CM

## 2024-01-05 DIAGNOSIS — E6609 Other obesity due to excess calories: Secondary | ICD-10-CM | POA: Diagnosis not present

## 2024-01-05 DIAGNOSIS — D649 Anemia, unspecified: Secondary | ICD-10-CM | POA: Diagnosis not present

## 2024-01-05 DIAGNOSIS — E1140 Type 2 diabetes mellitus with diabetic neuropathy, unspecified: Secondary | ICD-10-CM

## 2024-01-05 DIAGNOSIS — N3281 Overactive bladder: Secondary | ICD-10-CM

## 2024-01-05 DIAGNOSIS — E66811 Obesity, class 1: Secondary | ICD-10-CM

## 2024-01-05 DIAGNOSIS — Z794 Long term (current) use of insulin: Secondary | ICD-10-CM

## 2024-01-05 DIAGNOSIS — Z23 Encounter for immunization: Secondary | ICD-10-CM

## 2024-01-05 DIAGNOSIS — N8110 Cystocele, unspecified: Secondary | ICD-10-CM

## 2024-01-05 NOTE — Progress Notes (Signed)
 Careteam: Patient Care Team: Caro Harlene POUR, NP as PCP - General (Geriatric Medicine) Latisha Medford, MD as Consulting Physician (Obstetrics and Gynecology) Porter Andrez SAUNDERS, PA-C (Inactive) as Physician Assistant (Dermatology)  PLACE OF SERVICE:  [facility]  Advanced Directive information    Allergies[1]  Chief Complaint  Patient presents with   Medical Management of Chronic Issues    Six Months Follow-up, needs flu vaccine     HPI:  Discussed the use of AI scribe software for clinical note transcription with the patient, who gave verbal consent to proceed.  History of Present Illness Brittany Klein is a 68 year old female who presents for a six-month follow-up   She is confused about a recommendation for a hysterectomy from her GYN despite not experiencing bleeding or pain. She has a history of bladder prolapse and recalls a recent Pap test from two months ago, which indicated worsening of her condition. She has not tried other treatments beyond a pessary ring, which she declined. She is scheduled to see a urologist or urogyn in April.  She has high cholesterol, managed with Lipitor 40 mg daily. Her cholesterol levels were within normal limits at her last visit.  Her diabetes is managed with Metformin  1000 mg twice daily with food and Ozempic  1 mg weekly. Her weight has remained stable, and her BMI is 32.  She has a history of chronic anemia since her first child. No blood in stools and she is unsure of the anemia's origin but has been stable.   She has a history of an overactive bladder but is not on any medication for it.   No bleeding, pain, constipation, acid reflux, shortness of breath, or coughing. Regular bowel movements and no issues with chewing or swallowing. She does not smoke.   Review of Systems:  Review of Systems  Constitutional:  Negative for chills, fever and weight loss.  HENT:  Negative for tinnitus.   Respiratory:  Negative for cough,  sputum production and shortness of breath.   Cardiovascular:  Negative for chest pain, palpitations and leg swelling.  Gastrointestinal:  Negative for abdominal pain, constipation, diarrhea and heartburn.  Genitourinary:  Negative for dysuria, frequency and urgency.  Musculoskeletal:  Negative for back pain, falls, joint pain and myalgias.  Skin: Negative.   Neurological:  Negative for dizziness and headaches.  Psychiatric/Behavioral:  Negative for depression and memory loss. The patient does not have insomnia.     Past Medical History:  Diagnosis Date   Anemia    Arthritis    knees, hands (08/04/2012)   Chronic lower back pain    all the time (08/04/2012)   Dysrhythmia    Tachycardia, comes and goes. Increases with activity   History of blood transfusion 01/21/2002   related to knee replacement (08/04/2012)   History of blood transfusion    Hyperlipemia    Tachycardia    Type II diabetes mellitus [facility])    Past Surgical History:  Procedure Laterality Date   CARPAL TUNNEL RELEASE Left 09/21/1988   HERNIA REPAIR  01/21/1998   umbilical   ROTATOR CUFF REPAIR Right 03/25/2022   TOTAL KNEE ARTHROPLASTY Left 10/22/2002   TOTAL KNEE REVISION Left 08/04/2012   TOTAL KNEE REVISION Left 08/04/2012   Procedure: LEFT TOTAL KNEE REVISION;  Surgeon: Cordella Glendia Hutchinson, MD;  Location: [facility] OR;  Service: Orthopedics;  Laterality: Left;   TUBAL LIGATION  01/21/1978   Social History:   reports that she quit smoking about 25 years  ago. Her smoking use included cigarettes. She started smoking about 48 years ago. She has a 23 pack-year smoking history. She has never been exposed to tobacco smoke. She has never used smokeless tobacco. She reports that she does not currently use alcohol. She reports that she does not use drugs.  Family History  Problem Relation Age of Onset   Diabetes Mother    Cancer Mother    Dementia Mother    Diabetes Father    Cancer Sister    Heart disease Sister     Diabetes Sister    Hypertension Sister    Stroke Sister    Diabetes Sister    Cancer Sister 54       lung   Diabetes Sister    Diabetes Sister    Diabetes Mellitus I Daughter     Medications: Patient's Medications  New Prescriptions   No medications on file  Previous Medications   ASPIRIN  EC 81 MG TABLET    Take 81 mg by mouth daily.   ATORVASTATIN  (LIPITOR) 40 MG TABLET    Take 1 tablet (40 mg total) by mouth daily.   BLOOD GLUCOSE MONITORING SUPPL (ONETOUCH VERIO REFLECT) W/DEVICE KIT    1 Device by Does not apply route in the morning and at bedtime.   CLOBETASOL  (TEMOVATE ) 0.05 % EXTERNAL SOLUTION    Apply 1 Application topically daily.   FLUOCINOLONE  ACETONIDE BODY 0.01 % OIL    Apply 1 Application topically daily.   GLUCOSE BLOOD (ONETOUCH VERIO) TEST STRIP    Use as instructed   KETOCONAZOLE  (NIZORAL ) 2 % SHAMPOO    Apply 1 Application topically 2 (two) times a week.   METFORMIN  (GLUCOPHAGE ) 1000 MG TABLET    TAKE 1 TABLET BY MOUTH TWICE DAILY WITH FOOD   SEMAGLUTIDE , 1 MG/DOSE, (OZEMPIC , 1 MG/DOSE,) 2 MG/1.5ML SOPN    Inject 1 mg into the skin once a week.  Modified Medications   No medications on file  Discontinued Medications   No medications on file    Physical Exam:  Vitals:   01/05/24 0834  BP: 124/78  Pulse: 71  Resp: 18  Temp: (!) 96.8 F (36 C)  SpO2: 97%  Weight: 187 lb 3.2 oz (84.9 kg)  Height: 5' 4 (1.626 m)   Body mass index is 32.13 kg/m. Wt Readings from Last 3 Encounters:  01/05/24 187 lb 3.2 oz (84.9 kg)  07/07/23 184 lb 12.8 oz (83.8 kg)  05/05/23 188 lb 6.4 oz (85.5 kg)    Physical Exam Constitutional:      General: She is not in acute distress.    Appearance: She is well-developed. She is not diaphoretic.  HENT:     Head: Normocephalic and atraumatic.     Mouth/Throat:     Pharynx: No oropharyngeal exudate.  Eyes:     Conjunctiva/sclera: Conjunctivae normal.     Pupils: Pupils are equal, round, and reactive to light.   Cardiovascular:     Rate and Rhythm: Normal rate and regular rhythm.     Heart sounds: Normal heart sounds.  Pulmonary:     Effort: Pulmonary effort is normal.     Breath sounds: Normal breath sounds.  Abdominal:     General: Bowel sounds are normal.     Palpations: Abdomen is soft.  Musculoskeletal:     Cervical back: Normal range of motion and neck supple.     Right lower leg: No edema.     Left lower leg: No edema.  Skin:  General: Skin is warm and dry.  Neurological:     Mental Status: She is alert.  Psychiatric:        Mood and Affect: Mood normal.    Labs reviewed: Basic Metabolic Panel: Recent Labs    03/07/23 1036 07/07/23 0952  NA 138 139  K 5.4* 4.9  CL 104 103  CO2 27 28  GLUCOSE 97 110*  BUN 16 23  CREATININE 0.96 1.01  CALCIUM  9.5 9.3   Liver Function Tests: Recent Labs    03/07/23 1036 07/07/23 0952  AST 13 11  ALT 9 9  BILITOT 0.3 0.4  PROT 6.9 6.4   No results for input(s): LIPASE, AMYLASE in the last 8760 hours. No results for input(s): AMMONIA in the last 8760 hours. CBC: Recent Labs    03/07/23 1036 07/07/23 0952  WBC 6.2 7.5  NEUTROABS 3,224 4,230  HGB 9.6* 9.5*  HCT 30.6* 31.4*  MCV 85.0 88.0  PLT 339 300   Lipid Panel: Recent Labs    07/07/23 0952  CHOL 159  HDL 53  LDLCALC 85  TRIG 117  CHOLHDL 3.0   TSH: No results for input(s): TSH in the last 8760 hours. A1C: Lab Results  Component Value Date   HGBA1C 6.5 (H) 07/07/2023     Assessment/Plan  Immunization due -     Flu vaccine HIGH DOSE PF(Fluzone Trivalent)   Assessment and Plan Assessment & Plan Prolapse of female bladder (cystocele) Worsening bladder prolapse without bleeding or pain. Confusion about hysterectomy recommendation - Requested records from Dr. Katrina for review. - Ensure communication with urologist regarding PCP involvement.  Type 2 diabetes mellitus with diabetic neuropathy Managed with metformin  and Ozempic . Stable  weight, BMI 32. - Continue metformin  1000 mg twice daily. - Continue Ozempic  1 mg weekly. - Rechecked A1c today. -Encouraged dietary compliance, routine foot care/monitoring and to keep up with diabetic eye exams through ophthalmology   Mixed hyperlipidemia Managed with Lipitor. Previous cholesterol levels satisfactory. - Continue Lipitor 40 mg daily. - Maintain low-fat diet.  Obesity BMI 32, stable weight. - Encouraged weight loss through diet and exercise to achieve BMI less than 30.  Anemia Chronic anemia, unclear etiology, no symptoms of bleeding or GI issues. - Continue to monitor anemia status.  Overactive bladder Stable at this time.   General health maintenance Up to date with flu vaccination- received today - Continue routine health maintenance.    Return in about 6 months (around 07/05/2024) for routine follow up.  Shar Paez K. Caro BODILY [facility] [phone]     [1]  Allergies Allergen Reactions   Diclofenac Other (See Comments)   Morphine And Codeine Itching

## 2024-01-06 ENCOUNTER — Ambulatory Visit: Payer: Self-pay | Admitting: Nurse Practitioner

## 2024-01-06 LAB — CBC WITH DIFFERENTIAL/PLATELET
Absolute Lymphocytes: 2512 {cells}/uL (ref 850–3900)
Absolute Monocytes: 455 {cells}/uL (ref 200–950)
Basophils Absolute: 28 {cells}/uL (ref 0–200)
Basophils Relative: 0.4 %
Eosinophils Absolute: 131 {cells}/uL (ref 15–500)
Eosinophils Relative: 1.9 %
HCT: 32.9 % — ABNORMAL LOW (ref 35.9–46.0)
Hemoglobin: 9.8 g/dL — ABNORMAL LOW (ref 11.7–15.5)
MCH: 25.5 pg — ABNORMAL LOW (ref 27.0–33.0)
MCHC: 29.8 g/dL — ABNORMAL LOW (ref 31.6–35.4)
MCV: 85.5 fL (ref 81.4–101.7)
MPV: 9.9 fL (ref 7.5–12.5)
Monocytes Relative: 6.6 %
Neutro Abs: 3774 {cells}/uL (ref 1500–7800)
Neutrophils Relative %: 54.7 %
Platelets: 349 Thousand/uL (ref 140–400)
RBC: 3.85 Million/uL (ref 3.80–5.10)
RDW: 12.8 % (ref 11.0–15.0)
Total Lymphocyte: 36.4 %
WBC: 6.9 Thousand/uL (ref 3.8–10.8)

## 2024-01-06 LAB — COMPREHENSIVE METABOLIC PANEL WITH GFR
AG Ratio: 1.4 (calc) (ref 1.0–2.5)
ALT: 13 U/L (ref 6–29)
AST: 15 U/L (ref 10–35)
Albumin: 4.1 g/dL (ref 3.6–5.1)
Alkaline phosphatase (APISO): 90 U/L (ref 37–153)
BUN: 16 mg/dL (ref 7–25)
CO2: 30 mmol/L (ref 20–32)
Calcium: 9.4 mg/dL (ref 8.6–10.4)
Chloride: 102 mmol/L (ref 98–110)
Creat: 1.01 mg/dL (ref 0.50–1.05)
Globulin: 3 g/dL (ref 1.9–3.7)
Glucose, Bld: 112 mg/dL — ABNORMAL HIGH (ref 65–99)
Potassium: 5.3 mmol/L (ref 3.5–5.3)
Sodium: 140 mmol/L (ref 135–146)
Total Bilirubin: 0.4 mg/dL (ref 0.2–1.2)
Total Protein: 7.1 g/dL (ref 6.1–8.1)
eGFR: 61 mL/min/1.73m2 (ref >=60)

## 2024-01-06 LAB — HEMOGLOBIN A1C
Hgb A1c MFr Bld: 6.2 % — ABNORMAL HIGH (ref <5.7)
Mean Plasma Glucose: 131 mg/dL
eAG (mmol/L): 7.3 mmol/L

## 2024-02-04 ENCOUNTER — Telehealth: Payer: Self-pay

## 2024-02-04 MED ORDER — FREESTYLE FREEDOM LITE W/DEVICE KIT: 1.0000 | PACK | Freq: Two times a day (BID) | 0 refills | Status: AC

## 2024-02-04 NOTE — Telephone Encounter (Signed)
 Patient came into the office and spoke with Mrs. Darice our administration office staff to asked the new diabetic medical supply because the blood glucose meters and test strips we cover due to insurance will change. The patient stating the meter asking for Freestyle Freedom Lite and sent to the pharmacy.

## 2024-02-24 ENCOUNTER — Telehealth: Payer: Self-pay

## 2024-02-24 NOTE — Telephone Encounter (Signed)
 Copied from CRM [id number]. Topic: Clinical - Prescription Issue >> Feb 24, 2024  3:13 PM Cherylann RAMAN wrote: Reason for CRM: Renny, Pharmacy Tech at [facility], called and stated that the Blood Glucose Monitoring Suppl (FREESTYLE FREEDOM LITE) w/Device KIT patient was prescribed has been permanently discontinued and is unable to be ordered. Renny would like to know if a new prescription could be sent in for a new freestyle kit. Please contact Iesha at [phone] for additional information.   Please Advise

## 2024-02-25 ENCOUNTER — Encounter: Payer: Self-pay | Admitting: Nurse Practitioner

## 2024-02-25 NOTE — Telephone Encounter (Signed)
 Noted.

## 2024-02-25 NOTE — Telephone Encounter (Signed)
 Called returned to the pharmacy tech requesting specifications regarding requested glucometer. We need the EXACT name of the freestyle kit.  No answer and no voicemail for the number listed within this encounter for [facility] with [facility].   I called the number on the pharmacy contact list and spoke with a young female that asked for authorization to change it to whatever the patient prefers, verbal authorization provide, and he plans to further communicate with the patient about the type of machine to be dispensed

## 2024-04-29 ENCOUNTER — Ambulatory Visit: Payer: Medicare (Managed Care) | Admitting: Dermatology

## 2024-05-07 ENCOUNTER — Ambulatory Visit: Payer: Medicare (Managed Care) | Admitting: Nurse Practitioner

## 2024-07-05 ENCOUNTER — Ambulatory Visit: Payer: Medicare (Managed Care) | Admitting: Nurse Practitioner
